# Patient Record
Sex: Female | Born: 1950 | Race: White | Hispanic: No | Smoking: Current every day smoker
Health system: Southern US, Community
[De-identification: ages and names within clinical notes are randomized; demographics above are authoritative.]

## PROBLEM LIST (undated history)

## (undated) DIAGNOSIS — G44009 Cluster headache syndrome, unspecified, not intractable: Secondary | ICD-10-CM

## (undated) DIAGNOSIS — F419 Anxiety disorder, unspecified: Secondary | ICD-10-CM

## (undated) DIAGNOSIS — R269 Unspecified abnormalities of gait and mobility: Secondary | ICD-10-CM

## (undated) DIAGNOSIS — R32 Unspecified urinary incontinence: Secondary | ICD-10-CM

## (undated) DIAGNOSIS — R413 Other amnesia: Secondary | ICD-10-CM

## (undated) DIAGNOSIS — E739 Lactose intolerance, unspecified: Secondary | ICD-10-CM

## (undated) DIAGNOSIS — F5105 Insomnia due to other mental disorder: Secondary | ICD-10-CM

## (undated) HISTORY — DX: Unspecified urinary incontinence: R32

## (undated) HISTORY — DX: Unspecified abnormalities of gait and mobility: R26.9

## (undated) HISTORY — PX: ABDOMINAL HYSTERECTOMY: SHX81

## (undated) HISTORY — DX: Other amnesia: R41.3

## (undated) HISTORY — DX: Cluster headache syndrome, unspecified, not intractable: G44.009

## (undated) HISTORY — PX: APPENDECTOMY: SHX54

## (undated) HISTORY — DX: Anxiety disorder, unspecified: F51.05

## (undated) HISTORY — DX: Anxiety disorder, unspecified: F41.9

---

## 1997-09-23 ENCOUNTER — Ambulatory Visit (HOSPITAL_COMMUNITY): Admission: RE | Admit: 1997-09-23 | Discharge: 1997-09-23 | Payer: Self-pay | Admitting: Family Medicine

## 1997-11-04 ENCOUNTER — Encounter: Payer: Self-pay | Admitting: Emergency Medicine

## 1997-11-04 ENCOUNTER — Emergency Department (HOSPITAL_COMMUNITY): Admission: EM | Admit: 1997-11-04 | Discharge: 1997-11-04 | Payer: Self-pay | Admitting: Emergency Medicine

## 1998-09-08 ENCOUNTER — Ambulatory Visit (HOSPITAL_COMMUNITY): Admission: RE | Admit: 1998-09-08 | Discharge: 1998-09-08 | Payer: Self-pay | Admitting: *Deleted

## 1998-12-13 ENCOUNTER — Ambulatory Visit (HOSPITAL_COMMUNITY): Admission: RE | Admit: 1998-12-13 | Discharge: 1998-12-13 | Payer: Self-pay | Admitting: *Deleted

## 1999-07-07 ENCOUNTER — Emergency Department (HOSPITAL_COMMUNITY): Admission: EM | Admit: 1999-07-07 | Discharge: 1999-07-07 | Payer: Self-pay | Admitting: Emergency Medicine

## 1999-07-07 ENCOUNTER — Encounter: Payer: Self-pay | Admitting: Emergency Medicine

## 1999-10-08 ENCOUNTER — Encounter: Payer: Self-pay | Admitting: Emergency Medicine

## 1999-10-08 ENCOUNTER — Emergency Department (HOSPITAL_COMMUNITY): Admission: EM | Admit: 1999-10-08 | Discharge: 1999-10-08 | Payer: Self-pay | Admitting: Emergency Medicine

## 2001-05-18 ENCOUNTER — Emergency Department (HOSPITAL_COMMUNITY): Admission: EM | Admit: 2001-05-18 | Discharge: 2001-05-18 | Payer: Self-pay | Admitting: Emergency Medicine

## 2004-03-11 ENCOUNTER — Ambulatory Visit: Payer: Self-pay | Admitting: *Deleted

## 2004-03-11 ENCOUNTER — Ambulatory Visit: Payer: Self-pay | Admitting: Family Medicine

## 2004-03-28 ENCOUNTER — Ambulatory Visit (HOSPITAL_COMMUNITY): Admission: RE | Admit: 2004-03-28 | Discharge: 2004-03-28 | Payer: Self-pay | Admitting: Internal Medicine

## 2004-03-29 ENCOUNTER — Encounter: Admission: RE | Admit: 2004-03-29 | Discharge: 2004-03-29 | Payer: Self-pay | Admitting: Internal Medicine

## 2004-04-19 ENCOUNTER — Ambulatory Visit: Payer: Self-pay | Admitting: Family Medicine

## 2004-12-01 ENCOUNTER — Ambulatory Visit: Payer: Self-pay | Admitting: Family Medicine

## 2004-12-09 ENCOUNTER — Ambulatory Visit (HOSPITAL_COMMUNITY): Admission: RE | Admit: 2004-12-09 | Discharge: 2004-12-09 | Payer: Self-pay | Admitting: Internal Medicine

## 2006-03-06 ENCOUNTER — Ambulatory Visit: Payer: Self-pay | Admitting: Nurse Practitioner

## 2006-03-09 ENCOUNTER — Ambulatory Visit (HOSPITAL_COMMUNITY): Admission: RE | Admit: 2006-03-09 | Discharge: 2006-03-09 | Payer: Self-pay | Admitting: Internal Medicine

## 2006-03-28 ENCOUNTER — Ambulatory Visit: Payer: Self-pay | Admitting: Family Medicine

## 2006-05-11 ENCOUNTER — Ambulatory Visit (HOSPITAL_COMMUNITY): Admission: RE | Admit: 2006-05-11 | Discharge: 2006-05-11 | Payer: Self-pay | Admitting: Family Medicine

## 2006-05-27 ENCOUNTER — Ambulatory Visit (HOSPITAL_COMMUNITY): Admission: RE | Admit: 2006-05-27 | Discharge: 2006-05-27 | Payer: Self-pay | Admitting: Internal Medicine

## 2006-06-28 ENCOUNTER — Ambulatory Visit: Payer: Self-pay | Admitting: Family Medicine

## 2006-08-10 ENCOUNTER — Emergency Department (HOSPITAL_COMMUNITY): Admission: EM | Admit: 2006-08-10 | Discharge: 2006-08-10 | Payer: Self-pay | Admitting: Emergency Medicine

## 2006-10-10 ENCOUNTER — Encounter (INDEPENDENT_AMBULATORY_CARE_PROVIDER_SITE_OTHER): Payer: Self-pay | Admitting: *Deleted

## 2006-12-04 ENCOUNTER — Emergency Department (HOSPITAL_COMMUNITY): Admission: EM | Admit: 2006-12-04 | Discharge: 2006-12-05 | Payer: Self-pay | Admitting: Emergency Medicine

## 2007-02-06 ENCOUNTER — Ambulatory Visit: Payer: Self-pay | Admitting: Internal Medicine

## 2007-02-07 ENCOUNTER — Ambulatory Visit (HOSPITAL_COMMUNITY): Admission: RE | Admit: 2007-02-07 | Discharge: 2007-02-07 | Payer: Self-pay | Admitting: Family Medicine

## 2007-02-19 ENCOUNTER — Emergency Department (HOSPITAL_COMMUNITY): Admission: EM | Admit: 2007-02-19 | Discharge: 2007-02-19 | Payer: Self-pay | Admitting: Emergency Medicine

## 2007-02-20 ENCOUNTER — Emergency Department (HOSPITAL_COMMUNITY): Admission: EM | Admit: 2007-02-20 | Discharge: 2007-02-20 | Payer: Self-pay | Admitting: Emergency Medicine

## 2007-04-26 ENCOUNTER — Encounter: Payer: Self-pay | Admitting: Internal Medicine

## 2007-04-26 ENCOUNTER — Ambulatory Visit: Payer: Self-pay | Admitting: Internal Medicine

## 2007-04-26 LAB — CONVERTED CEMR LAB
Chlamydia, DNA Probe: NEGATIVE
GC Probe Amp, Genital: NEGATIVE

## 2007-09-19 ENCOUNTER — Encounter: Payer: Self-pay | Admitting: Internal Medicine

## 2007-09-19 ENCOUNTER — Ambulatory Visit: Payer: Self-pay | Admitting: Internal Medicine

## 2009-02-23 ENCOUNTER — Telehealth (INDEPENDENT_AMBULATORY_CARE_PROVIDER_SITE_OTHER): Payer: Self-pay | Admitting: *Deleted

## 2010-02-13 ENCOUNTER — Encounter: Payer: Self-pay | Admitting: Internal Medicine

## 2010-02-13 ENCOUNTER — Encounter: Payer: Self-pay | Admitting: Family Medicine

## 2010-02-22 NOTE — Progress Notes (Signed)
Summary: triage/abd pain  Phone Note Call from Patient   Caller: Patient Reason for Call: Talk to Nurse Summary of Call: Patient states she has a history of intestinal adhesions in which surgery was required and that she was told she may come back..She is now having off and on pain in her lower adb that feels like labor pain..She states her last BM was yesterday and was normal..Her bowel pattern is every other day.Marland KitchenMarland KitchenShe denies vomiting and has a cold at this time..She smokes 3/4 PPD.Marland Kitchenand states her O2 sat was low..She isn't sure of the number either 64 or 94?  She is speaking in full sentences although her voice sounds scratchy.Marland KitchenMarland KitchenAppointment made in Amy's schedule Patient has to catch the bus to get here..Advised her to call 911 if difficulty breathing, vomiting or extreme pain. Initial call taken by: Conchita Paris,  February 23, 2009 5:14 PM

## 2010-10-13 LAB — BASIC METABOLIC PANEL
BUN: 11
Calcium: 9.3
Chloride: 102
Creatinine, Ser: 0.65
GFR calc Af Amer: >60
Potassium: 3.7
Sodium: 136

## 2010-10-13 LAB — DIFFERENTIAL
Lymphocytes Relative: 14
Lymphs Abs: 1.7
Monocytes Absolute: 0.9
Monocytes Relative: 7
Neutro Abs: 9.3 — ABNORMAL HIGH

## 2010-10-13 LAB — CBC
Hemoglobin: 13.1
MCHC: 34.6
RBC: 4.36
RDW: 12.9
WBC: 12.2 — ABNORMAL HIGH

## 2010-11-21 ENCOUNTER — Telehealth: Payer: Self-pay | Admitting: Family Medicine

## 2010-11-21 NOTE — Telephone Encounter (Signed)
Closed

## 2011-11-13 ENCOUNTER — Encounter (HOSPITAL_BASED_OUTPATIENT_CLINIC_OR_DEPARTMENT_OTHER): Payer: Self-pay | Admitting: *Deleted

## 2011-11-13 ENCOUNTER — Emergency Department (HOSPITAL_BASED_OUTPATIENT_CLINIC_OR_DEPARTMENT_OTHER): Payer: Self-pay

## 2011-11-13 ENCOUNTER — Emergency Department (HOSPITAL_BASED_OUTPATIENT_CLINIC_OR_DEPARTMENT_OTHER)
Admission: EM | Admit: 2011-11-13 | Discharge: 2011-11-13 | Disposition: A | Discharge status: Home / Self Care | Payer: Self-pay | Attending: Emergency Medicine | Admitting: Emergency Medicine

## 2011-11-13 DIAGNOSIS — J40 Bronchitis, not specified as acute or chronic: Secondary | ICD-10-CM | POA: Insufficient documentation

## 2011-11-13 DIAGNOSIS — G43909 Migraine, unspecified, not intractable, without status migrainosus: Secondary | ICD-10-CM | POA: Insufficient documentation

## 2011-11-13 DIAGNOSIS — R05 Cough: Secondary | ICD-10-CM | POA: Insufficient documentation

## 2011-11-13 DIAGNOSIS — F17200 Nicotine dependence, unspecified, uncomplicated: Secondary | ICD-10-CM | POA: Insufficient documentation

## 2011-11-13 DIAGNOSIS — R0602 Shortness of breath: Secondary | ICD-10-CM | POA: Insufficient documentation

## 2011-11-13 DIAGNOSIS — R110 Nausea: Secondary | ICD-10-CM | POA: Insufficient documentation

## 2011-11-13 DIAGNOSIS — R059 Cough, unspecified: Secondary | ICD-10-CM | POA: Insufficient documentation

## 2011-11-13 LAB — CBC
HCT: 36.4 % (ref 36.0–46.0)
MCH: 29 pg (ref 26.0–34.0)
MCHC: 32.4 g/dL (ref 30.0–36.0)
MCV: 89.4 fL (ref 78.0–100.0)
Platelets: 279 10*3/uL (ref 150–400)
RBC: 4.07 MIL/uL (ref 3.87–5.11)
RDW: 12.7 % (ref 11.5–15.5)
WBC: 8.6 10*3/uL (ref 4.0–10.5)

## 2011-11-13 LAB — COMPREHENSIVE METABOLIC PANEL
ALT: 7 U/L (ref 0–35)
AST: 13 U/L (ref 0–37)
BUN: 14 mg/dL (ref 6–23)
CO2: 27 mEq/L (ref 19–32)
Calcium: 9.7 mg/dL (ref 8.4–10.5)
Chloride: 104 mEq/L (ref 96–112)
Creatinine, Ser: 0.7 mg/dL (ref 0.50–1.10)
Potassium: 4.4 mEq/L (ref 3.5–5.1)
Sodium: 140 mEq/L (ref 135–145)

## 2011-11-13 MED ORDER — BUTALBITAL-APAP-CAFFEINE 50-325-40 MG PO TABS: 1.0000 | ORAL_TABLET | Freq: Four times a day (QID) | ORAL | Status: DC | PRN

## 2011-11-13 MED ORDER — AZITHROMYCIN 250 MG PO TABS: ORAL_TABLET | ORAL | Status: DC

## 2011-11-13 NOTE — ED Notes (Signed)
Pt c/o woke of with chest pain at 6 am radiates down right arm

## 2011-11-13 NOTE — ED Provider Notes (Signed)
History   This chart was scribed for Carleene Cooper III, MD by Sofie Rower. The patient was seen in room MH03/MH03 and the patient's care was started at 3:16PM.     CSN: [id number]  Arrival date & time 11/13/11  1352   First MD Initiated Contact with Patient 11/13/11 1516      Chief Complaint  Patient presents with  . Chest Pain    (Consider location/radiation/quality/duration/timing/severity/associated sxs/prior treatment) Patient is a 61 y.o. female presenting with chest pain. The history is provided by the patient. No language interpreter was used.  Chest Pain The chest pain began 6 - 12 hours ago. Chest pain occurs intermittently. The chest pain is worsening. The quality of the pain is described as pressure-like. The pain radiates to the right arm. Primary symptoms include shortness of breath, cough and nausea. Pertinent negatives for primary symptoms include no fever and no vomiting.  The shortness of breath began today. The shortness of breath developed suddenly. The shortness of breath is moderate.  The cough began today. The cough is productive. The sputum is white and green. It is exacerbated by smoking.  Nausea began today. She tried nothing for the symptoms. Risk factors include smoking/tobacco exposure.     Rachel Bryan is a 61 y.o. female , with a hx of migraine, who presents to the Emergency Department complaining of intermittnet, progressively worsening, chest pain, located at the left side of the chest, radiating towards the right upper extremity, onset today (6:00AM).  Associated symptoms include light headedness, shakiness, blurred vision, shortness of breath, productive white-green cough, and nausea. The pt reports she awoke this morning, experiencing a pressure like chest pain. The pt describes her chest pain as if someone is sitting on her chest. In addition, the pt informs she has been previously treated with nerve medication and migraine medication, however, due to the  recent closing of [facility], she has been unable to receive her medication. The pt has a hx of hysterectomy (performed 37 years ago) and allergies to demerol, penicillin, morphine, codeine, and sulfa antibiotics. In addition, the pt has a familial hx of heart disease (mother, CHF) and (brother).    The pt denies fever, earache, sore throat, vomiting, difficulty urinating, rash, fainting spells, and seizure or convulsion.    The pt is a current everyday smoker (1.0 packs/day), however, she does not drink alcohol.   PCP was [facility], however, the pt does not have a PCP at present.    History reviewed. No pertinent past medical history.  Past Surgical History  Procedure Date  . Appendectomy   . Abdominal hysterectomy     History reviewed. No pertinent family history.  History  Substance Use Topics  . Smoking status: Current Every Day Smoker -- 1.0 packs/day  . Smokeless tobacco: Not on file  . Alcohol Use: No    OB History    Grav Para Term Preterm Abortions TAB SAB Ect Mult Living                  Review of Systems  Constitutional: Negative for fever.  Respiratory: Positive for cough and shortness of breath.   Cardiovascular: Positive for chest pain.  Gastrointestinal: Positive for nausea. Negative for vomiting.  All other systems reviewed and are negative.    Allergies  Codeine; Demerol; Morphine and related; Penicillins; and Sulfa antibiotics  Home Medications  No current outpatient prescriptions on file.  BP 123/72  Pulse 88  Temp 98.1 F (36.7 C) (Oral)  Resp 18  Ht 5\' 7"  (1.702 m)  Wt 135 lb (61.236 kg)  BMI 21.14 kg/m2  SpO2 100%  Physical Exam  Nursing note and vitals reviewed. Constitutional: She is oriented to person, place, and time. She appears well-developed and well-nourished.  HENT:  Head: Atraumatic.  Right Ear: Tympanic membrane normal.  Left Ear: Tympanic membrane normal.  Nose: Nose normal.  Mouth/Throat: Oropharynx is clear and  moist.  Eyes: Conjunctivae normal and EOM are normal. Pupils are equal, round, and reactive to light.  Neck: Normal range of motion. Neck supple.  Cardiovascular: Normal rate, regular rhythm and normal heart sounds.   Pulmonary/Chest: Effort normal. She has rales (Both posterior lung fields. ).  Abdominal: Soft. Bowel sounds are normal.  Musculoskeletal: Normal range of motion.  Lymphadenopathy:    She has no cervical adenopathy.  Neurological: She is alert and oriented to person, place, and time.       Neurologically intact.   Skin: Skin is warm and dry.  Psychiatric: She has a normal mood and affect. Her behavior is normal.    ED Course  Procedures (including critical care time)  DIAGNOSTIC STUDIES: Oxygen Saturation is 100% on [place], normal by my interpretation.    COORDINATION OF CARE:   3:40 PM- Treatment plan concerning evaluation of laboratory results including blood sugar, troponin, and CBC. In addition, chest x-ray results discussed. Application of azithromycin antibiotics discussed with patient. Pt agrees with treatment.      Results for orders placed during the hospital encounter of 11/13/11  CBC      Component Value Range   WBC 8.6  4.0 - 10.5 K/uL   RBC 4.07  3.87 - 5.11 MIL/uL   Hemoglobin 11.8 (*) 12.0 - 15.0 g/dL   HCT 45.4  09.8 - 11.9 %   MCV 89.4  78.0 - 100.0 fL   MCH 29.0  26.0 - 34.0 pg   MCHC 32.4  30.0 - 36.0 g/dL   RDW 14.7  82.9 - 56.2 %   Platelets 279  150 - 400 K/uL  COMPREHENSIVE METABOLIC PANEL      Component Value Range   Sodium 140  135 - 145 mEq/L   Potassium 4.4  3.5 - 5.1 mEq/L   Chloride 104  96 - 112 mEq/L   CO2 27  19 - 32 mEq/L   Glucose, Bld 96  70 - 99 mg/dL   BUN 14  6 - 23 mg/dL   Creatinine, Ser 1.30  0.50 - 1.10 mg/dL   Calcium 9.7  8.4 - 86.5 mg/dL   Total Protein 7.0  6.0 - 8.3 g/dL   Albumin 3.6  3.5 - 5.2 g/dL   AST 13  0 - 37 U/L   ALT 7  0 - 35 U/L   Alkaline Phosphatase 63  39 - 117 U/L   Total Bilirubin 0.2 (*)  0.3 - 1.2 mg/dL   GFR calc non Af Amer >90  >90 mL/min   GFR calc Af Amer >90  >90 mL/min  TROPONIN I      Component Value Range   Troponin I <0.30  <0.30 ng/mL   Dg Chest 2 View  11/13/2011  *RADIOLOGY REPORT*  Clinical Data: Chest pain  CHEST - 2 VIEW  Comparison: February 20, 2007  Findings: There is no focal infiltrate, pulmonary edema, or pleural effusion.  Stable biapical pleural thickening are noted.  The mediastinal contour and cardiac silhouette are normal.  Soft tissues and osseous structures are stable.  IMPRESSION: No acute cardiopulmonary disease identified.   Original Report Authenticated By: Sherian Rein, M.D.    3:18 PM.  Date: 11/13/2011  Rate: 84  Rhythm: normal sinus rhythm  QRS Axis: normal  Intervals: normal  ST/T Wave abnormalities: normal  Conduction Disutrbances:none  Narrative Interpretation: Normal EKG  Old EKG Reviewed: unchanged      1. Bronchitis   2. Migraine headache     I personally performed the services described in this documentation, which was scribed in my presence. The recorded information has been reviewed and considered.  Osvaldo Human, MD    Carleene Cooper III, MD 11/13/11 [phone]

## 2011-11-13 NOTE — ED Notes (Signed)
MD at bedside.Dr Davidson 

## 2012-01-12 ENCOUNTER — Ambulatory Visit (INDEPENDENT_AMBULATORY_CARE_PROVIDER_SITE_OTHER): Payer: Self-pay | Admitting: Family Medicine

## 2012-01-12 ENCOUNTER — Encounter: Payer: Self-pay | Admitting: Family Medicine

## 2012-01-12 VITALS — BP 133/73 | HR 94 | Temp 97.8°F | Ht 66.0 in | Wt 145.0 lb

## 2012-01-12 DIAGNOSIS — F4321 Adjustment disorder with depressed mood: Secondary | ICD-10-CM | POA: Insufficient documentation

## 2012-01-12 DIAGNOSIS — D2360 Other benign neoplasm of skin of unspecified upper limb, including shoulder: Secondary | ICD-10-CM

## 2012-01-12 DIAGNOSIS — G4700 Insomnia, unspecified: Secondary | ICD-10-CM | POA: Insufficient documentation

## 2012-01-12 DIAGNOSIS — F419 Anxiety disorder, unspecified: Secondary | ICD-10-CM

## 2012-01-12 DIAGNOSIS — R51 Headache: Secondary | ICD-10-CM

## 2012-01-12 DIAGNOSIS — D367 Benign neoplasm of other specified sites: Secondary | ICD-10-CM

## 2012-01-12 DIAGNOSIS — F411 Generalized anxiety disorder: Secondary | ICD-10-CM

## 2012-01-12 MED ORDER — HYDROXYZINE HCL 10 MG PO TABS: 10.0000 mg | ORAL_TABLET | Freq: Every evening | ORAL | Status: DC | PRN

## 2012-01-12 NOTE — Patient Instructions (Addendum)
Thank you for coming in today Please start taking the hydroxyzine for sleep as needed. If this is too expensive then try Benadryl. Please come back as needed to discuss your other medical problems Please start using either Mucinex D or robitussin for your cough. Please start taking Ibuprofen 600mg  every 6 hours for your arm pain.  If the cyst continues to grow you may want it removed sometime in January You are doing great. You will be a better person for going through your current challenges

## 2012-01-15 ENCOUNTER — Encounter: Payer: Self-pay | Admitting: Family Medicine

## 2012-01-15 DIAGNOSIS — D367 Benign neoplasm of other specified sites: Secondary | ICD-10-CM | POA: Insufficient documentation

## 2012-01-15 DIAGNOSIS — R519 Headache, unspecified: Secondary | ICD-10-CM | POA: Insufficient documentation

## 2012-01-15 NOTE — Assessment & Plan Note (Signed)
Steroid lidocaine injection Possible excision in the future

## 2012-01-15 NOTE — Assessment & Plan Note (Signed)
No fioricet at this time Tylenol and NSAID prn

## 2012-01-15 NOTE — Assessment & Plan Note (Signed)
Hydroxyzine prn 

## 2012-01-15 NOTE — Progress Notes (Signed)
Rachel Bryan is a 61 y.o. female who presents to [facility] today for New Patient. Sleep issues, and Cyst in Arm  Sleep: difficulty sleeping since son went to prison abt 41mo ago. Kids have been in foster home for 3 mo. Watches TV around 8pm and falls asleep for 30mi, then has difficulty faling asleep. Takes Tylenol PM w/o relief. Tired during the day. No caffinated beverages.   Cyst in arm: present for 1 year and growing slowly. 4 days ago became painful. Denies fever, rash, discharge, skin changes.   Headaches: Has had off and on for years. Dx w/ Cluster headaches. Resolve w/ fioricet. Last took approximately 1 yr ago.  Treated for Bronchitis back in 11/13/11. Finished Azithro. Denies any lasting cough, fever, SOB.  The following portions of the patient's history were reviewed and updated as appropriate: allergies, current medications, past medical history, family and social history, and problem list.  Patient is a smoker    No past medical history on file.  ROS as above otherwise neg.    Medications reviewed. Current Outpatient Prescriptions  Medication Sig Dispense Refill  . azithromycin (ZITHROMAX Z-PAK) 250 MG tablet Take 2 tablets today, and then 1 tablet once a day for the next 4 days.  6 tablet  0  . butalbital-acetaminophen-caffeine (FIORICET) 50-325-40 MG per tablet Take 1-2 tablets by mouth every 6 (six) hours as needed for headache.  20 tablet  0  . hydrOXYzine (ATARAX/VISTARIL) 10 MG tablet Take 1 tablet (10 mg total) by mouth at bedtime as needed for anxiety.  30 tablet  0    Exam: BP 133/73  Pulse 94  Temp 97.8 F (36.6 C) (Oral)  Ht 5\' 6"  (1.676 m)  Wt 145 lb (65.772 kg)  BMI 23.40 kg/m2 Gen: Well NAD HEENT: EOMI,  MMM Lungs: CTABL Nl WOB Heart: RRR no MRG Musc: 1x2cm mobile round cystlike structure of the L forearm in the biceps region. Abd: NABS, NT, ND Exts: Non edematous BL  LE, warm and well perfused.   No results found for this or any previous visit (from the  past 72 hour(s)).

## 2012-01-15 NOTE — Assessment & Plan Note (Signed)
Likely secondary to anxiety SLeep journal Sleep hygeine discussed Hydroxyzine prn

## 2012-01-18 ENCOUNTER — Telehealth: Payer: Self-pay | Admitting: Family Medicine

## 2012-01-18 NOTE — Telephone Encounter (Signed)
EMERGENCY LINE CALL:  Patient states she has severe abdominal pain, tender to the touch in the epigastric area. She states she felt fine when she woke up this morning. She is having regular bowel movements. Patient has a history of peritoneal infection and this feels the same. Since I am unable to evaluate her over the phone and she sounded like she was in pain, I have recommended she be evaluated in the ED tonight. Patient agrees with this plan.   Jonell Brumbaugh M. Gwenetta Devos, M.D. 01/18/2012 7:38 PM

## 2012-01-29 NOTE — Progress Notes (Signed)
  Cyst injection   Procedure: Steroid injection of inflamed sabaceous cyst Verbal consent given after discussion of risks and benefits Medication: 40mg  Solumedrol  2cc Lidocaine without epi Preparation: area cleansed with alcohol and betadine Time Out taken  Injection  Landmarks identified 3 cc of medication injected into the cyst space using  Patient tolerated well without bleeding or paresthesias. Patient expressed immediate relief from injection    Shelly Flatten, MD Family Medicine PGY-2 01/29/2012, 8:35 AM

## 2012-02-05 ENCOUNTER — Ambulatory Visit (INDEPENDENT_AMBULATORY_CARE_PROVIDER_SITE_OTHER): Payer: Self-pay | Admitting: Family Medicine

## 2012-02-05 VITALS — BP 121/77 | HR 88 | Temp 98.0°F | Ht 66.0 in | Wt 139.0 lb

## 2012-02-05 DIAGNOSIS — M25519 Pain in unspecified shoulder: Secondary | ICD-10-CM

## 2012-02-05 DIAGNOSIS — D367 Benign neoplasm of other specified sites: Secondary | ICD-10-CM

## 2012-02-05 DIAGNOSIS — F419 Anxiety disorder, unspecified: Secondary | ICD-10-CM | POA: Insufficient documentation

## 2012-02-05 DIAGNOSIS — F329 Major depressive disorder, single episode, unspecified: Secondary | ICD-10-CM

## 2012-02-05 DIAGNOSIS — F411 Generalized anxiety disorder: Secondary | ICD-10-CM

## 2012-02-05 DIAGNOSIS — G4700 Insomnia, unspecified: Secondary | ICD-10-CM

## 2012-02-05 DIAGNOSIS — D2360 Other benign neoplasm of skin of unspecified upper limb, including shoulder: Secondary | ICD-10-CM

## 2012-02-05 DIAGNOSIS — F32A Depression, unspecified: Secondary | ICD-10-CM | POA: Insufficient documentation

## 2012-02-05 MED ORDER — CITALOPRAM HYDROBROMIDE 20 MG PO TABS: 20.0000 mg | ORAL_TABLET | Freq: Every day | ORAL | Status: DC

## 2012-02-05 NOTE — Assessment & Plan Note (Addendum)
Starting Citalopram 20mg  Numerous life stressors including son in prison PHQ9 w/ score of 13  No SI/HI F/u in 1 wk

## 2012-02-05 NOTE — Patient Instructions (Addendum)
Thank you for coming in today You have a lot of stress in your life I would like for yuou to start citalopram for depression Please come back to see me in 1 week Please start taking 400-600mg  if ibuprofen for your shoulder pain I will try to get your records from [place] Have a great day. Shoulder Exercises EXERCISES  RANGE OF MOTION (ROM) AND STRETCHING EXERCISES These exercises may help you when beginning to rehabilitate your injury. Your symptoms may resolve with or without further involvement from your physician, physical therapist or athletic trainer. While completing these exercises, remember:   Restoring tissue flexibility helps normal motion to return to the joints. This allows healthier, less painful movement and activity.  An effective stretch should be held for at least 30 seconds.  A stretch should never be painful. You should only feel a gentle lengthening or release in the stretched tissue. ROM - Pendulum  Bend at the waist so that your right / left arm falls away from your body. Support yourself with your opposite hand on a solid surface, such as a table or a countertop.  Your right / left arm should be perpendicular to the ground. If it is not perpendicular, you need to lean over farther. Relax the muscles in your right / left arm and shoulder as much as possible.  Gently sway your hips and trunk so they move your right / left arm without any use of your right / left shoulder muscles.  Progress your movements so that your right / left arm moves side to side, then forward and backward, and finally, both clockwise and counterclockwise.  Complete __________ repetitions in each direction. Many people use this exercise to relieve discomfort in their shoulder as well as to gain range of motion. Repeat __________ times. Complete this exercise __________ times per day. STRETCH  Flexion, Standing  Stand with good posture. With an underhand grip on your right / left hand and  an overhand grip on the opposite hand, grasp a broomstick or cane so that your hands are a little more than shoulder-width apart.  Keeping your right / left elbow straight and shoulder muscles relaxed, push the stick with your opposite hand to raise your right / left arm in front of your body and then overhead. Raise your arm until you feel a stretch in your right / left shoulder, but before you have increased shoulder pain.  Try to avoid shrugging your right / left shoulder as your arm rises by keeping your shoulder blade tucked down and toward your mid-back spine. Hold __________ seconds.  Slowly return to the starting position. Repeat __________ times. Complete this exercise __________ times per day. STRETCH - Internal Rotation  Place your right / left hand behind your back, palm-up.  Throw a towel or belt over your opposite shoulder. Grasp the towel/belt with your right / left hand.  While keeping an upright posture, gently pull up on the towel/belt until you feel a stretch in the front of your right / left shoulder.  Avoid shrugging your right / left shoulder as your arm rises by keeping your shoulder blade tucked down and toward your mid-back spine.  Hold __________. Release the stretch by lowering your opposite hand. Repeat __________ times. Complete this exercise __________ times per day. STRETCH - External Rotation and Abduction  Stagger your stance through a doorframe. It does not matter which foot is forward.  As instructed by your physician, physical therapist or athletic trainer, place your  hands:  And forearms above your head and on the door frame.  And forearms at head-height and on the door frame.  At elbow-height and on the door frame.  Keeping your head and chest upright and your stomach muscles tight to prevent over-extending your low-back, slowly shift your weight onto your front foot until you feel a stretch across your chest and/or in the front of your  shoulders.  Hold __________ seconds. Shift your weight to your back foot to release the stretch. Repeat __________ times. Complete this stretch __________ times per day.  STRENGTHENING EXERCISES  These exercises may help you when beginning to rehabilitate your injury. They may resolve your symptoms with or without further involvement from your physician, physical therapist or athletic trainer. While completing these exercises, remember:   Muscles can gain both the endurance and the strength needed for everyday activities through controlled exercises.  Complete these exercises as instructed by your physician, physical therapist or athletic trainer. Progress the resistance and repetitions only as guided.  You may experience muscle soreness or fatigue, but the pain or discomfort you are trying to eliminate should never worsen during these exercises. If this pain does worsen, stop and make certain you are following the directions exactly. If the pain is still present after adjustments, discontinue the exercise until you can discuss the trouble with your clinician.  If advised by your physician, during your recovery, avoid activity or exercises which involve actions that place your right / left hand or elbow above your head or behind your back or head. These positions stress the tissues which are trying to heal. STRENGTH - Scapular Depression and Adduction  With good posture, sit on a firm chair. Supported your arms in front of you with pillows, arm rests or a table top. Have your elbows in line with the sides of your body.  Gently draw your shoulder blades down and toward your mid-back spine. Gradually increase the tension without tensing the muscles along the top of your shoulders and the back of your neck.  Hold for __________ seconds. Slowly release the tension and relax your muscles completely before completing the next repetition.  After you have practiced this exercise, remove the arm support  and complete it in standing as well as sitting. Repeat __________ times. Complete this exercise __________ times per day.  STRENGTH - External Rotators  Secure a rubber exercise band/tubing to a fixed object so that it is at the same height as your right / left elbow when you are standing or sitting on a firm surface.  Stand or sit so that the secured exercise band/tubing is at your side that is not injured.  Bend your elbow 90 degrees. Place a folded towel or small pillow under your right / left arm so that your elbow is a few inches away from your side.  Keeping the tension on the exercise band/tubing, pull it away from your body, as if pivoting on your elbow. Be sure to keep your body steady so that the movement is only coming from your shoulder rotating.  Hold __________ seconds. Release the tension in a controlled manner as you return to the starting position. Repeat __________ times. Complete this exercise __________ times per day.  STRENGTH - Supraspinatus  Stand or sit with good posture. Grasp a __________ weight or an exercise band/tubing so that your hand is "thumbs-up," like when you shake hands.  Slowly lift your right / left hand from your thigh into the air, traveling about  30 degrees from straight out at your side. Lift your hand to shoulder height or as far as you can without increasing any shoulder pain. Initially, many people do not lift their hands above shoulder height.  Avoid shrugging your right / left shoulder as your arm rises by keeping your shoulder blade tucked down and toward your mid-back spine.  Hold for __________ seconds. Control the descent of your hand as you slowly return to your starting position. Repeat __________ times. Complete this exercise __________ times per day.  STRENGTH - Shoulder Extensors  Secure a rubber exercise band/tubing so that it is at the height of your shoulders when you are either standing or sitting on a firm arm-less chair.  With  a thumbs-up grip, grasp an end of the band/tubing in each hand. Straighten your elbows and lift your hands straight in front of you at shoulder height. Step back away from the secured end of band/tubing until it becomes tense.  Squeezing your shoulder blades together, pull your hands down to the sides of your thighs. Do not allow your hands to go behind you.  Hold for __________ seconds. Slowly ease the tension on the band/tubing as you reverse the directions and return to the starting position. Repeat __________ times. Complete this exercise __________ times per day.  STRENGTH - Scapular Retractors  Secure a rubber exercise band/tubing so that it is at the height of your shoulders when you are either standing or sitting on a firm arm-less chair.  With a palm-down grip, grasp an end of the band/tubing in each hand. Straighten your elbows and lift your hands straight in front of you at shoulder height. Step back away from the secured end of band/tubing until it becomes tense.  Squeezing your shoulder blades together, draw your elbows back as you bend them. Keep your upper arm lifted away from your body throughout the exercise.  Hold __________ seconds. Slowly ease the tension on the band/tubing as you reverse the directions and return to the starting position. Repeat __________ times. Complete this exercise __________ times per day. STRENGTH  Scapular Depressors  Find a sturdy chair without wheels, such as a from a dining room table.  Keeping your feet on the floor, lift your bottom from the seat and lock your elbows.  Keeping your elbows straight, allow gravity to pull your body weight down. Your shoulders will rise toward your ears.  Raise your body against gravity by drawing your shoulder blades down your back, shortening the distance between your shoulders and ears. Although your feet should always maintain contact with the floor, your feet should progressively support less body weight as  you get stronger.  Hold __________ seconds. In a controlled and slow manner, lower your body weight to begin the next repetition. Repeat __________ times. Complete this exercise __________ times per day.  Document Released: 11/23/2004 Document Revised: 04/03/2011 Document Reviewed: 04/23/2008 [facility] Patient Information 2013 [place], [place].  Shoulder Pain The shoulder is a ball and socket joint. Many muscles and tendons hold the joint together. Many types of injuries and medical problems can cause pain in one or more parts of the shoulder. HOME CARE  If your doctor feels the problem is not serious, it may help to do the following:  Put ice on the area.  Put ice in a plastic bag.  Place a towel between your skin and the bag.  Leave the ice on for 15 to 20 minutes, 3 to 4 times a day.  Do this  for the first 2 day or as told by your doctor.  Stop using cold packs if they do not help with the pain.  Do not take your sling off (except to shower or bathe) until you see your doctor. When taking off the sling, move the arm as little as possible.  Take medicine as told by your doctor.  Keep all follow-up appointments. GET HELP RIGHT AWAY IF:   The arm, hand, or fingers are numb or tingling.  The arm, hand, or fingers are puffy (swollen), painful, or turn white or blue.  You have trouble moving your hand and fingers on the injured side.  You have chest pain or shortness of breath.  New pain happens in the arm, hand, or fingers.  The hand or fingers on the injured side become cold.  The medicine is not helping the pain go away. MAKE SURE YOU:   Understand these instructions.  Will watch your condition.  Will get help right away if you are not doing well or get worse. Document Released: 06/28/2007 Document Revised: 04/03/2011 Document Reviewed: 06/28/2007 [facility] Patient Information 2013 [place], [place].

## 2012-02-05 NOTE — Progress Notes (Signed)
Rachel Bryan is a 62 y.o. female who presents to [facility] today for hospital follow up  SBO: admitted to [facility] for SBO. Discharged on 01/23/12 w/ cipro and flagyl that are now completed. Denies n/v/d/c, fever.  Insomnia: Mild improvement after taking out evening nap. Still w/ multiple nightime awakenings. Feels very anxious. Several family stressors. Tried benadryl w/o success.  Depression: feelings of depression. Nevern previously dx. No previous medications. No h/o manic type episodes. PHQ9 today 13 w/ marked difficulty in sleep and interest in activities. Denies HI, SI. Symptoms present for approximately 74yr  Arm pain: resolved after injection  Shoulder pain: Present for several weeks. Worse w/ certain movements. Originates in shoulder and radiates down arm. Denies trauma. Has not taken anything to improve symptoms.    The following portions of the patient's history were reviewed and updated as appropriate: allergies, current medications, past medical history, family and social history, and problem list.  Patient is a smoker  Past Medical History  Diagnosis Date  . Cluster headache   . Anxiety   . Insomnia secondary to anxiety   . Incontinence of urine     ROS as above otherwise neg.    Medications reviewed. Current Outpatient Prescriptions  Medication Sig Dispense Refill  . butalbital-acetaminophen-caffeine (FIORICET) 50-325-40 MG per tablet Take 1-2 tablets by mouth every 6 (six) hours as needed for headache.  20 tablet  0  . hydrOXYzine (ATARAX/VISTARIL) 10 MG tablet Take 1 tablet (10 mg total) by mouth at bedtime as needed for anxiety.  30 tablet  0    Exam: BP 121/77  Pulse 88  Temp 98 F (36.7 C)  Ht 5\' 6"  (1.676 m)  Wt 139 lb (63.05 kg)  BMI 22.44 kg/m2 Gen: Well NAD HEENT: EOMI,  MMM Lungs: CTABL Nl WOB Heart: RRR no MRG Abd: NABS, NT, ND Musc: Hawkings minimally pos on L, Empty can positive on L. ROM diminished on L.  Psych: saddened affect. Speech  appropriate.   No results found for this or any previous visit (from the past 72 hour(s)).

## 2012-02-12 ENCOUNTER — Encounter: Payer: Self-pay | Admitting: Family Medicine

## 2012-02-12 ENCOUNTER — Ambulatory Visit (INDEPENDENT_AMBULATORY_CARE_PROVIDER_SITE_OTHER): Payer: Self-pay | Admitting: Family Medicine

## 2012-02-12 VITALS — BP 125/71 | HR 99 | Temp 98.4°F | Ht 66.0 in | Wt 142.0 lb

## 2012-02-12 DIAGNOSIS — M25519 Pain in unspecified shoulder: Secondary | ICD-10-CM | POA: Insufficient documentation

## 2012-02-12 DIAGNOSIS — F329 Major depressive disorder, single episode, unspecified: Secondary | ICD-10-CM

## 2012-02-12 DIAGNOSIS — F411 Generalized anxiety disorder: Secondary | ICD-10-CM

## 2012-02-12 DIAGNOSIS — G4700 Insomnia, unspecified: Secondary | ICD-10-CM

## 2012-02-12 DIAGNOSIS — F419 Anxiety disorder, unspecified: Secondary | ICD-10-CM

## 2012-02-12 NOTE — Patient Instructions (Addendum)
Thank you for coming in today The citalopram is working. Keep taking it You are doing great We will get the records from [facility] Please come back to see me 2-4 weeks We can talk more about your headaches at that time. Please call 24/7 with any concerns.   Citalopram tablets What is this medicine? CITALOPRAM (sye TAL oh pram) is a medicine for depression. This medicine may be used for other purposes; ask your health care provider or pharmacist if you have questions. What should I tell my health care provider before I take this medicine? They need to know if you have any of these conditions: -bipolar disorder or a family history of bipolar disorder -diabetes -heart disease -history of irregular heartbeat -kidney or liver disease -low levels of magnesium or potassium in the blood -receiving electroconvulsive therapy -seizures (convulsions) -suicidal thoughts or a previous suicide attempt -an unusual or allergic reaction to citalopram, escitalopram, other medicines, foods, dyes, or preservatives -pregnant or trying to become pregnant -breast-feeding How should I use this medicine? Take this medicine by mouth with a glass of water. Follow the directions on the prescription label. You can take it with or without food. Take your medicine at regular intervals. Do not take your medicine more often than directed. Do not stop taking this medicine suddenly except upon the advice of your doctor. Stopping this medicine too quickly may cause serious side effects or your condition may worsen. A special MedGuide will be given to you by the pharmacist with each prescription and refill. Be sure to read this information carefully each time. Talk to your pediatrician regarding the use of this medicine in children. Special care may be needed. Patients over 78 years old may have a stronger reaction and need a smaller dose. Overdosage: If you think you have taken too much of this medicine contact  a poison control center or emergency room at once. NOTE: This medicine is only for you. Do not share this medicine with others. What if I miss a dose? If you miss a dose, take it as soon as you can. If it is almost time for your next dose, take only that dose. Do not take double or extra doses. What may interact with this medicine? Do not take this medicine with any of the following medications: -cisapride -dofetlide -dronedarone -escitalopram -linezolid -MAOIs like Carbex, Eldepryl, Marplan, Nardil, and Parnate -methylene blue (injected into a vein) -pimozide -posaconazole -thioridazine -ziprasidone This medicine may also interact with the following medications: -alcohol -aspirin and aspirin-like medicines -carbamazepine -certain medicines for depression, anxiety, or psychotic disturbances -certain medicines for fungal infections like ketoconazole and itraconazole -certain medicines used to treat infections like chloroquine, clarithromycin, erythromycin, pentamidine -certain medicines for migraine headaches like almotriptan, eletriptan, frovatriptan, naratriptan, rizatriptan, sumatriptan, zolmitriptan -cimetidine -diuretics -fentanyl -furazolidone -isoniazid -lithium -medicines for sleep -medicines that treat or prevent blood clots like warfarin, enoxaparin, and dalteparin -methadone -metoprolol -NSAIDs, medicines for pain and inflammation, like ibuprofen or naproxen -omeprazole -other medicines that prolong the QT interval (cause an abnormal heart rhythm) -procarbazine -rasagiline -supplements like St. John's wort, kava kava, valerian -tramadol -tryptophan This list may not describe all possible interactions. Give your health care provider a list of all the medicines, herbs, non-prescription drugs, or dietary supplements you use. Also tell them if you smoke, drink alcohol, or use illegal drugs. Some items may interact with your medicine. What should I watch for while  using this medicine? Tell your doctor if your symptoms do not get better  or if they get worse. Visit your doctor or health care professional for regular checks on your progress. Because it may take several weeks to see the full effects of this medicine, it is important to continue your treatment as prescribed by your doctor. Patients and their families should watch out for new or worsening thoughts of suicide or depression. Also watch out for sudden changes in feelings such as feeling anxious, agitated, panicky, irritable, hostile, aggressive, impulsive, severely restless, overly excited and hyperactive, or not being able to sleep. If this happens, especially at the beginning of treatment or after a change in dose, call your health care professional. Bonita Quin may get drowsy or dizzy. Do not drive, use machinery, or do anything that needs mental alertness until you know how this medicine affects you. Do not stand or sit up quickly, especially if you are an older patient. This reduces the risk of dizzy or fainting spells. Alcohol may interfere with the effect of this medicine. Avoid alcoholic drinks. Your mouth may get dry. Chewing sugarless gum or sucking hard candy, and drinking plenty of water will help. Contact your doctor if the problem does not go away or is severe. What side effects may I notice from receiving this medicine? Side effects that you should report to your doctor or health care professional as soon as possible: -allergic reactions like skin rash, itching or hives, swelling of the face, lips, or tongue -chest pain -confusion -dizziness -fast, irregular heartbeat -fast talking and excited feelings or actions that are out of control -feeling faint or lightheaded, falls -hallucination, loss of contact with reality -seizures -shortness of breath -suicidal thoughts or other mood changes -unusual bleeding or bruising Side effects that usually do not require medical attention (report to your  doctor or health care professional if they continue or are bothersome): -blurred vision -change in appetite -change in sex drive or performance -headache -increased sweating -nausea -trouble sleeping This list may not describe all possible side effects. Call your doctor for medical advice about side effects. You may report side effects to FDA at 1-800-FDA-1088. Where should I keep my medicine? Keep out of reach of children. Store at room temperature between 15 and 30 degrees C (59 and 86 degrees F). Throw away any unused medicine after the expiration date. NOTE: This sheet is a summary. It may not cover all possible information. If you have questions about this medicine, talk to your doctor, pharmacist, or health care provider.  2013, [employer]. (05/26/2011 7:10:58 PM)

## 2012-02-12 NOTE — Assessment & Plan Note (Signed)
Improved

## 2012-02-12 NOTE — Progress Notes (Signed)
Rachel Bryan is a 62 y.o. female who presents to [facility] today for depression  Depression: Feels like citalopram. Feeling happier and more positive. Taking Citalopram as Rx. Denies andy SI/HI.   Anxiousness: better on citalopram  Insomnia: no real improvement at this time. Still not napping in the late evening   Shoulder pain: pain w/ exercises. Improving ROM and pain overall. Taking Ibuprofen w/ relief.   The following portions of the patient's history were reviewed and updated as appropriate: allergies, current medications, past medical history, family and social history, and problem list.  Patient is a smoker  Past Medical History  Diagnosis Date  . Cluster headache   . Anxiety   . Insomnia secondary to anxiety   . Incontinence of urine     ROS as above otherwise neg.    Medications reviewed. Current Outpatient Prescriptions  Medication Sig Dispense Refill  . butalbital-acetaminophen-caffeine (FIORICET) 50-325-40 MG per tablet Take 1-2 tablets by mouth every 6 (six) hours as needed for headache.  20 tablet  0  . citalopram (CELEXA) 20 MG tablet Take 1 tablet (20 mg total) by mouth daily.  30 tablet  3  . hydrOXYzine (ATARAX/VISTARIL) 10 MG tablet Take 1 tablet (10 mg total) by mouth at bedtime as needed for anxiety.  30 tablet  0    Exam: BP 125/71  Pulse 99  Temp 98.4 F (36.9 C) (Oral)  Ht 5\' 6"  (1.676 m)  Wt 142 lb (64.411 kg)  BMI 22.92 kg/m2 Gen: Well NAD HEENT: EOMI,  MMM Lungs:  WOB  No results found for this or any previous visit (from the past 72 hour(s)).

## 2012-02-12 NOTE — Assessment & Plan Note (Signed)
Mild improvement after making changes recommended at last appt Anxiousness and depression likely contributing significantly Citalopram Starting now and f/u in 1 wk

## 2012-02-12 NOTE — Assessment & Plan Note (Addendum)
Likely some rotator cuff impingement w/ supraspinatus inflammation Home exercise regimen provided NSAIDs for relief No injection at this time

## 2012-02-12 NOTE — Assessment & Plan Note (Signed)
Improved.  Continue NSAIDs and exercises

## 2012-02-12 NOTE — Assessment & Plan Note (Signed)
Not much improvement. Continue diary, sleep hygiene, citalopram as anxiousness/depression likely contributing

## 2012-02-12 NOTE — Assessment & Plan Note (Signed)
No pain currently after injection Stable. No intervention at this time

## 2012-02-12 NOTE — Assessment & Plan Note (Signed)
Much improved today Some placebo vs true benefit from Citalopram. Continue citalopram at current dose No HI/SI F/u in 2-4 wks pending pt preference.

## 2012-02-12 NOTE — Assessment & Plan Note (Signed)
Starting citalopram today F/u in 1 wk

## 2012-02-26 ENCOUNTER — Ambulatory Visit: Payer: Self-pay | Admitting: Family Medicine

## 2012-03-09 ENCOUNTER — Other Ambulatory Visit: Payer: Self-pay

## 2012-03-11 ENCOUNTER — Telehealth: Payer: Self-pay | Admitting: Family Medicine

## 2012-03-11 NOTE — Telephone Encounter (Signed)
Pt is asking for something for pain for her shoulder - she has appt for next week and needs something in the meantime   Or call [phone]

## 2012-03-12 NOTE — Telephone Encounter (Signed)
Pt is calling again and needs to know something asap

## 2012-03-13 ENCOUNTER — Ambulatory Visit (INDEPENDENT_AMBULATORY_CARE_PROVIDER_SITE_OTHER): Payer: Self-pay | Admitting: Family Medicine

## 2012-03-13 ENCOUNTER — Encounter: Payer: Self-pay | Admitting: Family Medicine

## 2012-03-13 VITALS — BP 136/84 | HR 88 | Ht 66.0 in | Wt 146.0 lb

## 2012-03-13 DIAGNOSIS — M79622 Pain in left upper arm: Secondary | ICD-10-CM

## 2012-03-13 DIAGNOSIS — M79609 Pain in unspecified limb: Secondary | ICD-10-CM

## 2012-03-13 DIAGNOSIS — M79602 Pain in left arm: Secondary | ICD-10-CM

## 2012-03-13 MED ORDER — GABAPENTIN 300 MG PO CAPS: 300.0000 mg | ORAL_CAPSULE | Freq: Three times a day (TID) | ORAL | Status: DC

## 2012-03-13 NOTE — Progress Notes (Signed)
  Subjective:    Patient ID: Rachel Bryan, female    DOB: 1950-04-02, 62 y.o.   MRN: [id number]  HPI  62 year old F with lef arm pain and headache. Her primary concern if her left arm pain.   Arm pain - Located in the left upper extremity proximal to the elbow. She notes that it is started a few weeks ago when she had an injection in a cyst on her left. It is described as severe stabbing pain that occurs when she reaches her arm behind her back. It starts in the area of cyst and radiates proximally towards the shoulder, but does not reach the shoulder. It does not hurt when she is at rest. She has tried ibuprofen and exercise, without relieft of the pain. It is not associated with swelling or redness. She denies a history of left arm injury or surgery.    Review of Systems Positive for headache and tremulousness    Objective:   Physical Exam BP 136/84  Pulse 88  Ht 5\' 6"  (1.676 m)  Wt 146 lb (66.225 kg)  BMI 23.58 kg/m2 Gen: thin, white female, mild distress Left arm - Exquisite TTP of left bicep without swelling, erythema, or warmth; normal flexion and extension of elbow; small palpable subcutaneous nodule  Shoulder Exam - left Inspection: normal Palpation:   Clavicle: WNL   AC Joint: WNL  Scapula: WNL             Biceps Tendon: no pain ROM/Strength:  Abduction (Suprapsinatus): normal  Internal Rotation/Liftoff (Subsapularis):normal  External Rotation (Inraspinatus/Teres Minor): limited Maneuvers:   Neer's: normal  Hawkin's:normal  Empty Can: normal  Drop Arm: normal       Assessment & Plan:  62 year old F with severe left upper arm pain after receiving a steroid injection of a sebaceous cyst.

## 2012-03-13 NOTE — Patient Instructions (Signed)
Dear Mrs. Venuto,   Thank you for coming to clinic today. Please read below regarding the issues that we discussed.   Left Arm Pain - I think that this pain may be nerve related. So I would like to try a medication called neurontin for a few weeks to see if it improves. Start by taking 300 mg at night. After a few days, you can increase to 300 mg in the morning if it doesn't make you too tired. Also, I suggest trying topical capsaicin cream.   Please follow up with Dr. Konrad Dolores in clinic in 2 weeks. Please call earlier if you have any questions or concerns.   Sincerely,   Dr. Clinton Sawyer

## 2012-03-13 NOTE — Telephone Encounter (Signed)
Called pt who was in clinic at time of call for clinic appt.  Pt would do well to have Meloxicam for MSK pain Pt informed of radiology report and need for f/u imaging in 6 mo for renal hypodensity noted at [facility]. Called and spoke to Dr. Clinton Sawyer who is seeing pt in clinic today. Appreciate his care/input  Shelly Flatten, MD Family Medicine PGY-2 03/13/2012, 3:06 PM

## 2012-03-15 DIAGNOSIS — M79622 Pain in left upper arm: Secondary | ICD-10-CM | POA: Insufficient documentation

## 2012-03-15 NOTE — Assessment & Plan Note (Signed)
After thorough examination, there was no evidence of shoulder pain or pathology. The pain is localized to the anterior left upper arm between the shoulder and elbow and is of unclear etiology. There no evidence of infection and is unlikely myositis as it is not painful at rest. It could be a complex regional pain syndrome started by the injection, however, that would be a diagnosis of exclusion. At this point, we will treat it as if it is an inflamed nerve of the LUE. She will start with neurontin 300 mg daily and titrate up. She was also instructed to use capsaicin cream. The case was discussed at length with Dr. Denny Levy, who also evaluated the patient. She is in agreement with this plan.

## 2012-03-18 ENCOUNTER — Ambulatory Visit: Payer: Self-pay | Admitting: Family Medicine

## 2012-03-25 ENCOUNTER — Ambulatory Visit: Payer: Self-pay | Admitting: Family Medicine

## 2012-03-25 ENCOUNTER — Telehealth: Payer: Self-pay | Admitting: Family Medicine

## 2012-03-25 NOTE — Telephone Encounter (Signed)
Please call pt and inform her that changing to BID is fine. Can even go down to QHS after one week if she would like

## 2012-03-25 NOTE — Telephone Encounter (Signed)
Patient is calling because she doesn't like the side effects that the Neurontin is giving to her and she would like to change it from 3 times a day to 2 times a day.

## 2012-03-26 NOTE — Telephone Encounter (Signed)
Pt informed. Rachel Bryan, Rachel Bryan  

## 2012-04-02 ENCOUNTER — Ambulatory Visit (INDEPENDENT_AMBULATORY_CARE_PROVIDER_SITE_OTHER): Payer: Self-pay | Admitting: Family Medicine

## 2012-04-02 ENCOUNTER — Encounter: Payer: Self-pay | Admitting: Family Medicine

## 2012-04-02 VITALS — BP 144/82 | HR 97 | Temp 98.2°F | Ht 66.0 in | Wt 141.0 lb

## 2012-04-02 DIAGNOSIS — D367 Benign neoplasm of other specified sites: Secondary | ICD-10-CM

## 2012-04-02 DIAGNOSIS — D2360 Other benign neoplasm of skin of unspecified upper limb, including shoulder: Secondary | ICD-10-CM

## 2012-04-02 DIAGNOSIS — R251 Tremor, unspecified: Secondary | ICD-10-CM

## 2012-04-02 DIAGNOSIS — F329 Major depressive disorder, single episode, unspecified: Secondary | ICD-10-CM

## 2012-04-02 DIAGNOSIS — F411 Generalized anxiety disorder: Secondary | ICD-10-CM

## 2012-04-02 DIAGNOSIS — M79609 Pain in unspecified limb: Secondary | ICD-10-CM

## 2012-04-02 DIAGNOSIS — F419 Anxiety disorder, unspecified: Secondary | ICD-10-CM

## 2012-04-02 MED ORDER — PREDNISONE 50 MG PO TABS: 50.0000 mg | ORAL_TABLET | Freq: Every day | ORAL | Status: DC

## 2012-04-02 MED ORDER — PROPRANOLOL HCL 10 MG PO TABS: 10.0000 mg | ORAL_TABLET | Freq: Two times a day (BID) | ORAL | Status: DC

## 2012-04-02 NOTE — Assessment & Plan Note (Addendum)
Likely some impingement on nerves. Prednisone 50 for 5 days for possible inflammatory component Will need excision to fully resolve.  Will consider referral if continues to worsen

## 2012-04-02 NOTE — Patient Instructions (Addendum)
Thank you for coming in today.  I think a great deal of your pains are related to your anxiety and depression Please start your prednisone. THis should help with your arm pain Please start taking propranolol. This will help with yoru tremor adn blood pressure Please restart yoru celexa. Increase your dose to 40mg  daily after 7 days.  Please come back to see me in 4 weeks, or sooner if needed.

## 2012-04-02 NOTE — Progress Notes (Signed)
Rachel Bryan is a 62 y.o. female who presents to [facility] today for left arm pain  Left arm pain: Multiple previous evaluations for this pain. No relief from prevous injections. Getting worse. Gabapentin started at last appt w/o benefit. Taking 300mg  BID since 03/21/12. Unable to use L arm in a meaningful way secondary to "cutting" pain. Endorses loss of sensation and strength in L hand.    R hand shaking: Started 3-4 weeks ago. Comes and goes. No identifyable triggers. No aggrevating factors. Decreased strength. Requiring help to wash and dress self. Stopped celexa 3-4 weeks ago.   Depression: stopped celexa 3-4 weeks ago. Due to concern for tremor.   Tobacco: now down to 1/2ppd  The following portions of the patient's history were reviewed and updated as appropriate: allergies, current medications, past medical history, family and social history, and problem list.  Patient is a smoker  Past Medical History  Diagnosis Date  . Cluster headache   . Anxiety   . Insomnia secondary to anxiety   . Incontinence of urine     ROS as above otherwise neg.    Medications reviewed. Current Outpatient Prescriptions  Medication Sig Dispense Refill  . butalbital-acetaminophen-caffeine (FIORICET) 50-325-40 MG per tablet Take 1-2 tablets by mouth every 6 (six) hours as needed for headache.  20 tablet  0  . citalopram (CELEXA) 20 MG tablet Take 1 tablet (20 mg total) by mouth daily.  30 tablet  3  . gabapentin (NEURONTIN) 300 MG capsule Take 1 capsule (300 mg total) by mouth 3 (three) times daily.  90 capsule  0  . hydrOXYzine (ATARAX/VISTARIL) 10 MG tablet Take 1 tablet (10 mg total) by mouth at bedtime as needed for anxiety.  30 tablet  0   No current facility-administered medications for this visit.    Exam: BP 144/82  Pulse 97  Temp(Src) 98.2 F (36.8 C) (Oral)  Ht 5\' 6"  (1.676 m)  Wt 141 lb (63.957 kg)  BMI 22.77 kg/m2 Gen: Well NAD HEENT: EOMI,  MMM MSK: L bicep w/ well circumscribed,  moveable cystic lesion. No cervical pain w/ movement. FROM or L arm/shoulder. ttp w/ palpation of L bicep lesion.  Neuro: CN 2-12 grossly intact, slight tremor of the r hand and arm sometimes w/ rest but primarily w/ intention.   No results found for this or any previous visit (from the past 72 hour(s)).

## 2012-04-08 NOTE — Assessment & Plan Note (Signed)
Strong component of her overall symptoms is her anxiety Restart celexa

## 2012-04-08 NOTE — Assessment & Plan Note (Signed)
See dermoid cyst of arm

## 2012-04-08 NOTE — Assessment & Plan Note (Addendum)
Pt stopped celexa Discussed risks and benefits and pt to restart.  Will increase to 40mg  after 1 week

## 2012-04-08 NOTE — Assessment & Plan Note (Signed)
Likely essential tremor.  WIll start low dose propranolol BID as also hypertensive today Likely also anxiety related.  No neurological deficits.

## 2012-05-07 ENCOUNTER — Ambulatory Visit: Payer: Self-pay | Admitting: Family Medicine

## 2012-05-13 ENCOUNTER — Ambulatory Visit (INDEPENDENT_AMBULATORY_CARE_PROVIDER_SITE_OTHER): Payer: Self-pay | Admitting: Family Medicine

## 2012-05-13 ENCOUNTER — Encounter: Payer: Self-pay | Admitting: *Deleted

## 2012-05-13 VITALS — BP 129/77 | HR 99 | Ht 67.0 in | Wt 143.0 lb

## 2012-05-13 DIAGNOSIS — M79609 Pain in unspecified limb: Secondary | ICD-10-CM

## 2012-05-13 DIAGNOSIS — R251 Tremor, unspecified: Secondary | ICD-10-CM

## 2012-05-13 DIAGNOSIS — F329 Major depressive disorder, single episode, unspecified: Secondary | ICD-10-CM

## 2012-05-13 DIAGNOSIS — R259 Unspecified abnormal involuntary movements: Secondary | ICD-10-CM

## 2012-05-13 DIAGNOSIS — M79622 Pain in left upper arm: Secondary | ICD-10-CM

## 2012-05-13 MED ORDER — CITALOPRAM HYDROBROMIDE 40 MG PO TABS: 40.0000 mg | ORAL_TABLET | Freq: Every day | ORAL | Status: DC

## 2012-05-13 NOTE — Patient Instructions (Addendum)
Thank you for coming into clinic today Please take the Celexa prescription to [facility], [facility], or [facility] to get it filled Please do your exercises daily!!! Your sluggish feeling and depression should improve once you start back on the celexa. Please come back in 2 weeks or sooner if needed.   Biceps Tendon Tendinitis (Distal) with Rehab Tendinitis involves inflammation and pain over the affected tendon. The distal biceps tendon (near the elbow) is vulnerable to tendinitis. Distal biceps tendonitis is usually due to the bony bump near the elbow (bicipital tuberosity) causing increased friction over the tendon. The biceps tendon attaches the biceps muscle to one bone in the elbow and two in the shoulder. It is important for proper function of the elbow and for turning the palm upward (supination). SYMPTOMS   Pain, aching, tenderness, and sometimes warmth or redness over the front of the elbow.  Pain when bending the elbow or turning the palm up, using the wrist, especially if performed against resistance.  Crackling sound (crepitation) when the tendon or elbow is moved or touched. CAUSES  The symptoms of biceps tendonitis are due to inflammation of the tendon. Inflammation may be caused by:  Strain from sudden increase in amount or intensity of activity.  Direct blow or injury to the elbow (uncommon).  Overuse or repetitive elbow bending or wrist rotation, particularly when turning the palm up, or with elbow hyperextension. RISK INCREASES WITH:  Sports that involve contact or overhead arm activity (throwing sports, gymnastics, weightlifting, bodybuilding, rock climbing).  Heavy labor.  Poor strength and flexibility.  Failure to warm up properly before activity.  Injury to other structures of the elbow.  Restraint of the elbow. PREVENTION  Warm up and stretch properly before activity.  Allow time for recovery between activities.  Maintain physical fitness:  Strength,  flexibility, and endurance.  Cardiovascular fitness.  Learn and use proper exercise technique. PROGNOSIS  With proper treatment, biceps tendon tendonitis is usually curable within 6 weeks.  RELATED COMPLICATIONS   Longer healing time if not properly treated or if not given enough time to heal.  Chronically inflamed tendon that causes persistent pain with activity, that may progress to constant pain and potentially rupture of the tendon.  Recurring symptoms, especially if activity is resumed too soon, with overuse or with poor technique. TREATMENT  Treatment first involves ice and medicine to reduce pain and inflammation. Modify activities that cause pain, to reduce the chances of causing the condition to get worse. Strengthening and stretching exercises should be performed to promote proper use of the muscles of the elbow. These exercise may be performed at home or with a therapist. Other treatments may be given such as ultrasound or heat therapy. Surgery is usually not recommended.  MEDICATION  If pain medicine is needed, nonsteroidal anti-inflammatory medicines (aspirin and ibuprofen), or other minor pain relievers (acetaminophen), are often advised.  Do not take pain relieving medication for 7 days before surgery.  Prescription pain relievers may be given if your caregiver thinks they are needed. Use only as directed and only as much as you need. HEAT AND COLD:   Cold treatment (icing) should be applied for 10 to 15 minutes every 2 to 3 hours for inflammation and pain, and immediately after activity that aggravates your symptoms. Use ice packs or an ice massage.  Heat treatment may be used before performing stretching and strengthening activities prescribed by your caregiver, physical therapist, or athletic trainer. Use a heat pack or a warm water soak.  SEEK MEDICAL CARE IF:   Symptoms get worse or do not improve in 2 weeks, despite treatment.  New, unexplained symptoms develop.  (Drugs used in treatment may produce side effects.) EXERCISES  RANGE OF MOTION (ROM) AND STRETCHING EXERCISES - Biceps Tendon Tendinitis (Distal) These exercises may help you when beginning to rehabilitate your injury. Your symptoms may go away with or without further involvement from your physician, physical therapist, or athletic trainer. While completing these exercises, remember:   Restoring tissue flexibility helps normal motion to return to the joints. This allows healthier, less painful movement and activity.  An effective stretch should be held for at least 30 seconds.  A stretch should never be painful. You should only feel a gentle lengthening or release in the stretched tissue. STRETCH  Elbow Flexors   Lie on a firm bed or countertop on your back. Be sure that you are in a comfortable position which will allow you to relax your arm muscles.  Place a folded towel under your right / left upper arm, so that your elbow and shoulder are at the same height. Extend your arm; your elbow should not rest on the bed or towel.  Allow the weight of your hand to straighten your elbow. Keep your arm and chest muscles relaxed. Your caretaker may ask you to increase the intensity of your stretch by adding a small wrist or hand weight.  Hold for __________ seconds. You should feel a stretch on the inside of your elbow. Slowly return to the starting position. Repeat __________ times. Complete this exercise __________ times per day. RANGE OF MOTION  Supination, Active   Stand or sit with your elbows at your side. Bend your right / left elbow to 90 degrees.  Turn your palm upward until you feel a gentle stretch on the inside of your forearm.  Hold this position for __________ seconds. Slowly release and return to the starting position. Repeat __________ times. Complete this stretch __________ times per day.  RANGE OF MOTION  Pronation, Active   Stand or sit with your elbows at your side. Bend  your right / left elbow to 90 degrees.  Turn your palm downward until you feel a gentle stretch on the top of your forearm.  Hold this position for __________ seconds. Slowly release and return to the starting position. Repeat __________ times. Complete this stretch __________ times per day.  STRENGTHENING EXERCISES - Biceps Tendon Tendinitis (Distal) These exercises may help you when beginning to rehabilitate your injury. They may resolve your symptoms with or without further involvement from your physician, physical therapist or athletic trainer. While completing these exercises, remember:   Muscles can gain both the endurance and the strength needed for everyday activities through controlled exercises.  Complete these exercises as instructed by your physician, physical therapist or athletic trainer. Increase the resistance and repetitions only as guided.  You may experience muscle soreness or fatigue, but the pain or discomfort you are trying to eliminate should never get worse during these exercises. If this pain does get worse, stop and make sure you are following the directions exactly. If the pain is still present after adjustments, discontinue the exercise until you can discuss the trouble with your clinician. STRENGTH - Elbow Flexors, Isometric   Stand or sit upright on a firm surface. Place your right / left arm so that your hand is palm-up and at the height of your waist.  Place your opposite hand on top of your forearm. Gently push  down as your right / left arm resists. Push as hard as you can with both arms without causing any pain or movement at your right / left elbow. Hold this stationary position for __________ seconds.  Gradually release the tension in both arms. Allow your muscles to relax completely before repeating. Repeat __________ times. Complete this exercise __________ times per day. STRENGTH  Forearm Supinators   Sit with your right / left forearm supported on a  table, keeping your elbow below shoulder height. Rest your hand over the edge, palm down.  Gently grip a hammer or a soup ladle.  Without moving your elbow, slowly turn your palm and hand upward to a "thumbs-up" position.  Hold this position for __________ seconds. Slowly return to the starting position. Repeat __________ times. Complete this exercise __________ times per day.  STRENGTH  Forearm Pronators   Sit with your right / left forearm supported on a table, keeping your elbow below shoulder height. Rest your hand over the edge, palm up.  Gently grip a hammer or a soup ladle.  Without moving your elbow, slowly turn your palm and hand upward to a "thumbs-up" position.  Hold this position for __________ seconds. Slowly return to the starting position. Repeat __________ times. Complete this exercise __________ times per day.  STRENGTH  Elbow Flexors, Supinated  With good posture, stand or sit on a firm chair without armrests. Allow your right / left arm to rest at your side with your palm facing forward.  Holding a __________ weight, or gripping a rubber exercise band or tubing, bring your hand toward your shoulder.  Allow your muscles to control the resistance as your hand returns to your side. Repeat __________ times. Complete this exercise __________ times per day.  STRENGTH  Elbow Flexors, Neutral  With good posture, stand or sit on a firm chair without armrests. Allow your right / left arm to rest at your side with your thumb facing forward.  Holding a __________ weight, or gripping a rubber exercise band or tubing, bring your hand toward your shoulder.  Allow your muscles to control the resistance as your hand returns to your side. Repeat __________ times. Complete this exercise __________ times per day.  Document Released: 01/09/2005 Document Revised: 04/03/2011 Document Reviewed: 04/23/2008 [facility] Patient Information 2013 [place], [place]. Impingement Syndrome, Rotator  Cuff, Bursitis with Rehab Impingement syndrome is a condition that involves inflammation of the tendons of the rotator cuff and the subacromial bursa, that causes pain in the shoulder. The rotator cuff consists of four tendons and muscles that control much of the shoulder and upper arm function. The subacromial bursa is a fluid filled sac that helps reduce friction between the rotator cuff and one of the bones of the shoulder (acromion). Impingement syndrome is usually an overuse injury that causes swelling of the bursa (bursitis), swelling of the tendon (tendonitis), and/or a tear of the tendon (strain). Strains are classified into three categories. Grade 1 strains cause pain, but the tendon is not lengthened. Grade 2 strains include a lengthened ligament, due to the ligament being stretched or partially ruptured. With grade 2 strains there is still function, although the function may be decreased. Grade 3 strains include a complete tear of the tendon or muscle, and function is usually impaired. SYMPTOMS   Pain around the shoulder, often at the outer portion of the upper arm.  Pain that gets worse with shoulder function, especially when reaching overhead or lifting.  Sometimes, aching when not using the  arm.  Pain that wakes you up at night.  Sometimes, tenderness, swelling, warmth, or redness over the affected area.  Loss of strength.  Limited motion of the shoulder, especially reaching behind the back (to the back pocket or to unhook bra) or across your body.  Crackling sound (crepitation) when moving the arm.  Biceps tendon pain and inflammation (in the front of the shoulder). Worse when bending the elbow or lifting. CAUSES  Impingement syndrome is often an overuse injury, in which chronic (repetitive) motions cause the tendons or bursa to become inflamed. A strain occurs when a force is paced on the tendon or muscle that is greater than it can withstand. Common mechanisms of injury  include: Stress from sudden increase in duration, frequency, or intensity of training.  Direct hit (trauma) to the shoulder.  Aging, erosion of the tendon with normal use.  Bony bump on shoulder (acromial spur). RISK INCREASES WITH:  Contact sports (football, wrestling, boxing).  Throwing sports (baseball, tennis, volleyball).  Weightlifting and bodybuilding.  Heavy labor.  Previous injury to the rotator cuff, including impingement.  Poor shoulder strength and flexibility.  Failure to warm up properly before activity.  Inadequate protective equipment.  Old age.  Bony bump on shoulder (acromial spur). PREVENTION   Warm up and stretch properly before activity.  Allow for adequate recovery between workouts.  Maintain physical fitness:  Strength, flexibility, and endurance.  Cardiovascular fitness.  Learn and use proper exercise technique. PROGNOSIS  If treated properly, impingement syndrome usually goes away within 6 weeks. Sometimes surgery is required.  RELATED COMPLICATIONS   Longer healing time if not properly treated, or if not given enough time to heal.  Recurring symptoms, that result in a chronic condition.  Shoulder stiffness, frozen shoulder, or loss of motion.  Rotator cuff tendon tear.  Recurring symptoms, especially if activity is resumed too soon, with overuse, with a direct blow, or when using poor technique. TREATMENT  Treatment first involves the use of ice and medicine, to reduce pain and inflammation. The use of strengthening and stretching exercises may help reduce pain with activity. These exercises may be performed at home or with a therapist. If non-surgical treatment is unsuccessful after more than 6 months, surgery may be advised. After surgery and rehabilitation, activity is usually possible in 3 months.  MEDICATION  If pain medicine is needed, nonsteroidal anti-inflammatory medicines (aspirin and ibuprofen), or other minor pain  relievers (acetaminophen), are often advised.  Do not take pain medicine for 7 days before surgery.  Prescription pain relievers may be given, if your caregiver thinks they are needed. Use only as directed and only as much as you need.  Corticosteroid injections may be given by your caregiver. These injections should be reserved for the most serious cases, because they may only be given a certain number of times. HEAT AND COLD  Cold treatment (icing) should be applied for 10 to 15 minutes every 2 to 3 hours for inflammation and pain, and immediately after activity that aggravates your symptoms. Use ice packs or an ice massage.  Heat treatment may be used before performing stretching and strengthening activities prescribed by your caregiver, physical therapist, or athletic trainer. Use a heat pack or a warm water soak. SEEK MEDICAL CARE IF:   Symptoms get worse or do not improve in 4 to 6 weeks, despite treatment.  New, unexplained symptoms develop. (Drugs used in treatment may produce side effects.) EXERCISES  RANGE OF MOTION (ROM) AND STRETCHING EXERCISES - Impingement  Syndrome (Rotator Cuff  Tendinitis, Bursitis) These exercises may help you when beginning to rehabilitate your injury. Your symptoms may go away with or without further involvement from your physician, physical therapist or athletic trainer. While completing these exercises, remember:   Restoring tissue flexibility helps normal motion to return to the joints. This allows healthier, less painful movement and activity.  An effective stretch should be held for at least 30 seconds.  A stretch should never be painful. You should only feel a gentle lengthening or release in the stretched tissue. STRETCH  Flexion, Standing  Stand with good posture. With an underhand grip on your right / left hand, and an overhand grip on the opposite hand, grasp a broomstick or cane so that your hands are a little more than shoulder width  apart.  Keeping your right / left elbow straight and shoulder muscles relaxed, push the stick with your opposite hand, to raise your right / left arm in front of your body and then overhead. Raise your arm until you feel a stretch in your right / left shoulder, but before you have increased shoulder pain.  Try to avoid shrugging your right / left shoulder as your arm rises, by keeping your shoulder blade tucked down and toward your mid-back spine. Hold for __________ seconds.  Slowly return to the starting position. Repeat __________ times. Complete this exercise __________ times per day. STRETCH  Abduction, Supine  Lie on your back. With an underhand grip on your right / left hand and an overhand grip on the opposite hand, grasp a broomstick or cane so that your hands are a little more than shoulder width apart.  Keeping your right / left elbow straight and your shoulder muscles relaxed, push the stick with your opposite hand, to raise your right / left arm out to the side of your body and then overhead. Raise your arm until you feel a stretch in your right / left shoulder, but before you have increased shoulder pain.  Try to avoid shrugging your right / left shoulder as your arm rises, by keeping your shoulder blade tucked down and toward your mid-back spine. Hold for __________ seconds.  Slowly return to the starting position. Repeat __________ times. Complete this exercise __________ times per day. ROM  Flexion, Active-Assisted  Lie on your back. You may bend your knees for comfort.  Grasp a broomstick or cane so your hands are about shoulder width apart. Your right / left hand should grip the end of the stick, so that your hand is positioned "thumbs-up," as if you were about to shake hands.  Using your healthy arm to lead, raise your right / left arm overhead, until you feel a gentle stretch in your shoulder. Hold for __________ seconds.  Use the stick to assist in returning your right  / left arm to its starting position. Repeat __________ times. Complete this exercise __________ times per day.  ROM - Internal Rotation, Supine   Lie on your back on a firm surface. Place your right / left elbow about 60 degrees away from your side. Elevate your elbow with a folded towel, so that the elbow and shoulder are the same height.  Using a broomstick or cane and your strong arm, pull your right / left hand toward your body until you feel a gentle stretch, but no increase in your shoulder pain. Keep your shoulder and elbow in place throughout the exercise.  Hold for __________ seconds. Slowly return to the starting position.  Repeat __________ times. Complete this exercise __________ times per day. STRETCH - Internal Rotation  Place your right / left hand behind your back, palm up.  Throw a towel or belt over your opposite shoulder. Grasp the towel with your right / left hand.  While keeping an upright posture, gently pull up on the towel, until you feel a stretch in the front of your right / left shoulder.  Avoid shrugging your right / left shoulder as your arm rises, by keeping your shoulder blade tucked down and toward your mid-back spine.  Hold for __________ seconds. Release the stretch, by lowering your healthy hand. Repeat __________ times. Complete this exercise __________ times per day. ROM - Internal Rotation   Using an underhand grip, grasp a stick behind your back with both hands.  While standing upright with good posture, slide the stick up your back until you feel a mild stretch in the front of your shoulder.  Hold for __________ seconds. Slowly return to your starting position. Repeat __________ times. Complete this exercise __________ times per day.  STRETCH  Posterior Shoulder Capsule   Stand or sit with good posture. Grasp your right / left elbow and draw it across your chest, keeping it at the same height as your shoulder.  Pull your elbow, so your upper  arm comes in closer to your chest. Pull until you feel a gentle stretch in the back of your shoulder.  Hold for __________ seconds. Repeat __________ times. Complete this exercise __________ times per day. STRENGTHENING EXERCISES - Impingement Syndrome (Rotator Cuff Tendinitis, Bursitis) These exercises may help you when beginning to rehabilitate your injury. They may resolve your symptoms with or without further involvement from your physician, physical therapist or athletic trainer. While completing these exercises, remember:  Muscles can gain both the endurance and the strength needed for everyday activities through controlled exercises.  Complete these exercises as instructed by your physician, physical therapist or athletic trainer. Increase the resistance and repetitions only as guided.  You may experience muscle soreness or fatigue, but the pain or discomfort you are trying to eliminate should never worsen during these exercises. If this pain does get worse, stop and make sure you are following the directions exactly. If the pain is still present after adjustments, discontinue the exercise until you can discuss the trouble with your clinician.  During your recovery, avoid activity or exercises which involve actions that place your injured hand or elbow above your head or behind your back or head. These positions stress the tissues which you are trying to heal. STRENGTH - Scapular Depression and Adduction   With good posture, sit on a firm chair. Support your arms in front of you, with pillows, arm rests, or on a table top. Have your elbows in line with the sides of your body.  Gently draw your shoulder blades down and toward your mid-back spine. Gradually increase the tension, without tensing the muscles along the top of your shoulders and the back of your neck.  Hold for __________ seconds. Slowly release the tension and relax your muscles completely before starting the next  repetition.  After you have practiced this exercise, remove the arm support and complete the exercise in standing as well as sitting position. Repeat __________ times. Complete this exercise __________ times per day.  STRENGTH - Shoulder Abductors, Isometric  With good posture, stand or sit about 4-6 inches from a wall, with your right / left side facing the wall.  Bend your right /  left elbow. Gently press your right / left elbow into the wall. Increase the pressure gradually, until you are pressing as hard as you can, without shrugging your shoulder or increasing any shoulder discomfort.  Hold for __________ seconds.  Release the tension slowly. Relax your shoulder muscles completely before you begin the next repetition. Repeat __________ times. Complete this exercise __________ times per day.  STRENGTH - External Rotators, Isometric  Keep your right / left elbow at your side and bend it 90 degrees.  Step into a door frame so that the outside of your right / left wrist can press against the door frame without your upper arm leaving your side.  Gently press your right / left wrist into the door frame, as if you were trying to swing the back of your hand away from your stomach. Gradually increase the tension, until you are pressing as hard as you can, without shrugging your shoulder or increasing any shoulder discomfort.  Hold for __________ seconds.  Release the tension slowly. Relax your shoulder muscles completely before you begin the next repetition. Repeat __________ times. Complete this exercise __________ times per day.  STRENGTH - Supraspinatus   Stand or sit with good posture. Grasp a __________ weight, or an exercise band or tubing, so that your hand is "thumbs-up," like you are shaking hands.  Slowly lift your right / left arm in a "V" away from your thigh, diagonally into the space between your side and straight ahead. Lift your hand to shoulder height or as far as you can,  without increasing any shoulder pain. At first, many people do not lift their hands above shoulder height.  Avoid shrugging your right / left shoulder as your arm rises, by keeping your shoulder blade tucked down and toward your mid-back spine.  Hold for __________ seconds. Control the descent of your hand, as you slowly return to your starting position. Repeat __________ times. Complete this exercise __________ times per day.  STRENGTH - External Rotators  Secure a rubber exercise band or tubing to a fixed object (table, pole) so that it is at the same height as your right / left elbow when you are standing or sitting on a firm surface.  Stand or sit so that the secured exercise band is at your uninjured side.  Bend your right / left elbow 90 degrees. Place a folded towel or small pillow under your right / left arm, so that your elbow is a few inches away from your side.  Keeping the tension on the exercise band, pull it away from your body, as if pivoting on your elbow. Be sure to keep your body steady, so that the movement is coming only from your rotating shoulder.  Hold for __________ seconds. Release the tension in a controlled manner, as you return to the starting position. Repeat __________ times. Complete this exercise __________ times per day.  STRENGTH - Internal Rotators   Secure a rubber exercise band or tubing to a fixed object (table, pole) so that it is at the same height as your right / left elbow when you are standing or sitting on a firm surface.  Stand or sit so that the secured exercise band is at your right / left side.  Bend your elbow 90 degrees. Place a folded towel or small pillow under your right / left arm so that your elbow is a few inches away from your side.  Keeping the tension on the exercise band, pull it across your  body, toward your stomach. Be sure to keep your body steady, so that the movement is coming only from your rotating shoulder.  Hold for  __________ seconds. Release the tension in a controlled manner, as you return to the starting position. Repeat __________ times. Complete this exercise __________ times per day.  STRENGTH - Scapular Protractors, Standing   Stand arms length away from a wall. Place your hands on the wall, keeping your elbows straight.  Begin by dropping your shoulder blades down and toward your mid-back spine.  To strengthen your protractors, keep your shoulder blades down, but slide them forward on your rib cage. It will feel as if you are lifting the back of your rib cage away from the wall. This is a subtle motion and can be challenging to complete. Ask your caregiver for further instruction, if you are not sure you are doing the exercise correctly.  Hold for __________ seconds. Slowly return to the starting position, resting the muscles completely before starting the next repetition. Repeat __________ times. Complete this exercise __________ times per day. STRENGTH - Scapular Protractors, Supine  Lie on your back on a firm surface. Extend your right / left arm straight into the air while holding a __________ weight in your hand.  Keeping your head and back in place, lift your shoulder off the floor.  Hold for __________ seconds. Slowly return to the starting position, and allow your muscles to relax completely before starting the next repetition. Repeat __________ times. Complete this exercise __________ times per day. STRENGTH - Scapular Protractors, Quadruped  Get onto your hands and knees, with your shoulders directly over your hands (or as close as you can be, comfortably).  Keeping your elbows locked, lift the back of your rib cage up into your shoulder blades, so your mid-back rounds out. Keep your neck muscles relaxed.  Hold this position for __________ seconds. Slowly return to the starting position and allow your muscles to relax completely before starting the next repetition. Repeat __________  times. Complete this exercise __________ times per day.  STRENGTH - Scapular Retractors  Secure a rubber exercise band or tubing to a fixed object (table, pole), so that it is at the height of your shoulders when you are either standing, or sitting on a firm armless chair.  With a palm down grip, grasp an end of the band in each hand. Straighten your elbows and lift your hands straight in front of you, at shoulder height. Step back, away from the secured end of the band, until it becomes tense.  Squeezing your shoulder blades together, draw your elbows back toward your sides, as you bend them. Keep your upper arms lifted away from your body throughout the exercise.  Hold for __________ seconds. Slowly ease the tension on the band, as you reverse the directions and return to the starting position. Repeat __________ times. Complete this exercise __________ times per day. STRENGTH - Shoulder Extensors   Secure a rubber exercise band or tubing to a fixed object (table, pole) so that it is at the height of your shoulders when you are either standing, or sitting on a firm armless chair.  With a thumbs-up grip, grasp an end of the band in each hand. Straighten your elbows and lift your hands straight in front of you, at shoulder height. Step back, away from the secured end of the band, until it becomes tense.  Squeezing your shoulder blades together, pull your hands down to the sides of your thighs.  Do not allow your hands to go behind you.  Hold for __________ seconds. Slowly ease the tension on the band, as you reverse the directions and return to the starting position. Repeat __________ times. Complete this exercise __________ times per day.  STRENGTH - Scapular Retractors and External Rotators   Secure a rubber exercise band or tubing to a fixed object (table, pole) so that it is at the height as your shoulders, when you are either standing, or sitting on a firm armless chair.  With a palm down  grip, grasp an end of the band in each hand. Bend your elbows 90 degrees and lift your elbows to shoulder height, at your sides. Step back, away from the secured end of the band, until it becomes tense.  Squeezing your shoulder blades together, rotate your shoulders so that your upper arms and elbows remain stationary, but your fists travel upward to head height.  Hold for __________ seconds. Slowly ease the tension on the band, as you reverse the directions and return to the starting position. Repeat __________ times. Complete this exercise __________ times per day.  STRENGTH - Scapular Retractors and External Rotators, Rowing   Secure a rubber exercise band or tubing to a fixed object (table, pole) so that it is at the height of your shoulders, when you are either standing, or sitting on a firm armless chair.  With a palm down grip, grasp an end of the band in each hand. Straighten your elbows and lift your hands straight in front of you, at shoulder height. Step back, away from the secured end of the band, until it becomes tense.  Step 1: Squeeze your shoulder blades together. Bending your elbows, draw your hands to your chest, as if you are rowing a boat. At the end of this motion, your hands and elbow should be at shoulder height and your elbows should be out to your sides.  Step 2: Rotate your shoulders, to raise your hands above your head. Your forearms should be vertical and your upper arms should be horizontal.  Hold for __________ seconds. Slowly ease the tension on the band, as you reverse the directions and return to the starting position. Repeat __________ times. Complete this exercise __________ times per day.  STRENGTH  Scapular Depressors  Find a sturdy chair without wheels, such as a dining room chair.  Keeping your feet on the floor, and your hands on the chair arms, lift your bottom up from the seat, and lock your elbows.  Keeping your elbows straight, allow gravity to pull  your body weight down. Your shoulders will rise toward your ears.  Raise your body against gravity by drawing your shoulder blades down your back, shortening the distance between your shoulders and ears. Although your feet should always maintain contact with the floor, your feet should progressively support less body weight, as you get stronger.  Hold for __________ seconds. In a controlled and slow manner, lower your body weight to begin the next repetition. Repeat __________ times. Complete this exercise __________ times per day.  Document Released: 01/09/2005 Document Revised: 04/03/2011 Document Reviewed: 04/23/2008 [facility] Patient Information 2013 [place], [place].

## 2012-05-13 NOTE — Progress Notes (Signed)
Rachel Bryan is a 62 y.o. female who presents to [facility] today for arm pain sluggish  Tremor: resolved. Taking propranolol as prescribed.  Feeling sluggish: started feeling sluggish about 1 wk. ran out of celexa 14 days ago. Can't afford celexa as it cost ~$50.   Arm pain: capsicin and gabapentin not helping w/ arm pain. Feels like someone is cutting her arm off w/ certain movements. Minimal to no pain w/o movement. Initially improved w/ prednisone 50mg  Q5 days. Ibuprofen w/ some relief. Not doing daily exercises. 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   Lump on chest 1 wk ago. Irritation and painful. No breast lumps.   Tobacco 1/2 ppd. Trying to quit.   The following portions of the patient's history were reviewed and updated as appropriate: allergies, current medications, past medical history, family and social history, and problem list.  Patient is a smoker  Past Medical History  Diagnosis Date  . Cluster headache   . Anxiety   . Insomnia secondary to anxiety   . Incontinence of urine     ROS as above otherwise neg.    Medications reviewed. Current Outpatient Prescriptions  Medication Sig Dispense Refill  . butalbital-acetaminophen-caffeine (FIORICET) 50-325-40 MG per tablet Take 1-2 tablets by mouth every 6 (six) hours as needed for headache.  20 tablet  0  . citalopram (CELEXA) 20 MG tablet Take 1 tablet (20 mg total) by mouth daily.  30 tablet  3  . gabapentin (NEURONTIN) 300 MG capsule Take 1 capsule (300 mg total) by mouth 3 (three) times daily.  90 capsule  0  . hydrOXYzine (ATARAX/VISTARIL) 10 MG tablet Take 1 tablet (10 mg total) by mouth at bedtime as needed for anxiety.  30 tablet  0  . predniSONE (DELTASONE) 50 MG tablet Take 1 tablet (50 mg total) by mouth daily.  5 tablet  0  . propranolol (INDERAL) 10 MG tablet Take 1 tablet (10 mg total) by mouth 2 (two) times daily.  60 tablet  3   No current  facility-administered medications for this visit.    Exam:  BP 129/77  Pulse 99  Ht 5\' 7"  (1.702 m)  Wt 143 lb (64.864 kg)  BMI 22.39 kg/m2 Gen: Well NAD HEENT: EOMI,  MMM Skin: No abnormality on skin. Intact and w/o rash. Warm and well perfused.  MSK: FROM, ttp along the distal bicep w/ a moveable well circumscribed mass. No edema, or joint effusion   No results found for this or any previous visit (from the past 72 hour(s)).

## 2012-05-15 NOTE — Assessment & Plan Note (Addendum)
Continues to be problematic for patient.  I think this is multifactorial. Primarily associated w/ mass effect of cyst on deep structures including nerves.  Pt has not been performing exercises and will start doing so. This is to help support pts msk pain from deconditioning from decreased movement secondary to pain. Pt endorses being able to do heavy lifting but has limited her movement in part due to people telling her to do less.

## 2012-05-15 NOTE — Assessment & Plan Note (Signed)
Resolved w/ propranolol. Continue current therapy as no evidence of hypotension or other adverse outcome from bblocker Again think there is an element of psychological impact/derivation of this.

## 2012-05-16 NOTE — Assessment & Plan Note (Signed)
Pts feelings of fatigue and worsening depression likely from stopping celexa around 2 wks ago. Pt given paper script for Celexa and told to take to different pharmacy. [facility], [facility] and [facility] should cover this medicine on their $4 list.  F/u in 2 wks

## 2012-07-03 ENCOUNTER — Ambulatory Visit: Payer: Self-pay | Admitting: Family Medicine

## 2012-09-05 ENCOUNTER — Telehealth: Payer: Self-pay | Admitting: Family Medicine

## 2012-09-05 NOTE — Telephone Encounter (Signed)
Pt is needing some advice on what to do for the shakes. Plus her right leg is hurting. Pt did make an appointment with Dr. Konrad Dolores for 8/20 . JW

## 2012-09-05 NOTE — Telephone Encounter (Signed)
Pt is advised that Dr Konrad Dolores will discuss both issues w/her at her appt next wk. Pt agreed to wait.

## 2012-09-11 ENCOUNTER — Encounter: Payer: Self-pay | Admitting: Family Medicine

## 2012-09-11 ENCOUNTER — Ambulatory Visit (INDEPENDENT_AMBULATORY_CARE_PROVIDER_SITE_OTHER): Payer: Medicaid Other | Admitting: Family Medicine

## 2012-09-11 VITALS — BP 135/75 | HR 101 | Temp 98.3°F | Ht 67.0 in | Wt 138.0 lb

## 2012-09-11 DIAGNOSIS — F419 Anxiety disorder, unspecified: Secondary | ICD-10-CM

## 2012-09-11 DIAGNOSIS — R259 Unspecified abnormal involuntary movements: Secondary | ICD-10-CM

## 2012-09-11 DIAGNOSIS — G4700 Insomnia, unspecified: Secondary | ICD-10-CM

## 2012-09-11 DIAGNOSIS — F411 Generalized anxiety disorder: Secondary | ICD-10-CM

## 2012-09-11 DIAGNOSIS — D2360 Other benign neoplasm of skin of unspecified upper limb, including shoulder: Secondary | ICD-10-CM

## 2012-09-11 DIAGNOSIS — D367 Benign neoplasm of other specified sites: Secondary | ICD-10-CM

## 2012-09-11 DIAGNOSIS — R229 Localized swelling, mass and lump, unspecified: Secondary | ICD-10-CM

## 2012-09-11 DIAGNOSIS — R251 Tremor, unspecified: Secondary | ICD-10-CM

## 2012-09-11 DIAGNOSIS — IMO0001 Reserved for inherently not codable concepts without codable children: Secondary | ICD-10-CM

## 2012-09-11 DIAGNOSIS — W19XXXA Unspecified fall, initial encounter: Secondary | ICD-10-CM

## 2012-09-11 MED ORDER — HYDROXYZINE HCL 10 MG PO TABS: 10.0000 mg | ORAL_TABLET | Freq: Every evening | ORAL | Status: DC | PRN

## 2012-09-11 MED ORDER — BUTALBITAL-APAP-CAFFEINE 50-325-40 MG PO TABS: 1.0000 | ORAL_TABLET | Freq: Four times a day (QID) | ORAL | Status: DC | PRN

## 2012-09-11 MED ORDER — CITALOPRAM HYDROBROMIDE 40 MG PO TABS: 40.0000 mg | ORAL_TABLET | Freq: Every day | ORAL | Status: DC

## 2012-09-11 MED ORDER — PROPRANOLOL HCL 10 MG PO TABS: 10.0000 mg | ORAL_TABLET | Freq: Two times a day (BID) | ORAL | Status: DC

## 2012-09-11 NOTE — Progress Notes (Signed)
Rachel Bryan is a 62 y.o. female who presents to [facility] today for SD appt for shaking, falls, bump on knee  Fallen 4 times in past week. Tripping and falling in house and out in grass. Denies CP, LOC, sob, palpitations.   Bump on knee: 2 wks ago. R leg. Nonpainful. Hard. Denies fever, purulent discharge.  Shaking: off propranolol.  Depression: feeling better. Denies HI/SI. No real change after stopping celexa in May. More crying episodes per pts daughter.   The following portions of the patient's history were reviewed and updated as appropriate: allergies, current medications, past medical history, family and social history, and problem list.  Patient is a nonsmoker.  Past Medical History  Diagnosis Date  . Cluster headache   . Anxiety   . Insomnia secondary to anxiety   . Incontinence of urine     ROS as above otherwise neg.    Medications reviewed. Current Outpatient Prescriptions  Medication Sig Dispense Refill  . butalbital-acetaminophen-caffeine (FIORICET) 50-325-40 MG per tablet Take 1-2 tablets by mouth every 6 (six) hours as needed for headache.  20 tablet  0  . citalopram (CELEXA) 40 MG tablet Take 1 tablet (40 mg total) by mouth daily.  90 tablet  3  . gabapentin (NEURONTIN) 300 MG capsule Take 1 capsule (300 mg total) by mouth 3 (three) times daily.  90 capsule  0  . hydrOXYzine (ATARAX/VISTARIL) 10 MG tablet Take 1 tablet (10 mg total) by mouth at bedtime as needed for anxiety.  30 tablet  0  . propranolol (INDERAL) 10 MG tablet Take 1 tablet (10 mg total) by mouth 2 (two) times daily.  60 tablet  3   No current facility-administered medications for this visit.    Exam:  BP 135/75  Pulse 101  Temp(Src) 98.3 F (36.8 C) (Oral)  Ht 5\' 7"  (1.702 m)  Wt 138 lb (62.596 kg)  BMI 21.61 kg/m2 Gen: Well NAD HEENT: EOMI,  MMM MSK: R lateral knee well circumscribed dime sized lesion that is mobile.   No results found for this or any previous visit (from the past 72  hour(s)).

## 2012-09-11 NOTE — Patient Instructions (Addendum)
  You are doing well overall I will refill your medications Please come back to see me in 2-4 weeks Please start daily exercise adn increase your time of exercising every day.

## 2012-09-13 DIAGNOSIS — R296 Repeated falls: Secondary | ICD-10-CM | POA: Insufficient documentation

## 2012-09-13 DIAGNOSIS — IMO0001 Reserved for inherently not codable concepts without codable children: Secondary | ICD-10-CM | POA: Insufficient documentation

## 2012-09-13 DIAGNOSIS — W19XXXA Unspecified fall, initial encounter: Secondary | ICD-10-CM | POA: Insufficient documentation

## 2012-09-13 NOTE — Assessment & Plan Note (Signed)
R leg bump likely lipoma vs benign cyst.  No intervention at this time

## 2012-09-13 NOTE — Assessment & Plan Note (Signed)
Returned after stopping propranolol. restart

## 2012-09-13 NOTE — Assessment & Plan Note (Signed)
Non painful at this time

## 2012-09-13 NOTE — Assessment & Plan Note (Signed)
Not taking citalopram. Restart and f/u in 2-4 wks

## 2012-09-13 NOTE — Assessment & Plan Note (Signed)
Likely mechanical from deconditioning Pt encouraged to get out and walk daily  Also associated w/ feelings odepression.

## 2012-09-25 ENCOUNTER — Ambulatory Visit: Payer: Self-pay | Admitting: Family Medicine

## 2012-10-07 ENCOUNTER — Telehealth: Payer: Self-pay | Admitting: Family Medicine

## 2012-10-07 DIAGNOSIS — R51 Headache: Secondary | ICD-10-CM

## 2012-10-07 NOTE — Telephone Encounter (Signed)
Pt called and would like a refill on her butalbital sent to her pharmacy on file. JW

## 2012-10-09 MED ORDER — BUTALBITAL-APAP-CAFFEINE 50-325-40 MG PO TABS: 1.0000 | ORAL_TABLET | Freq: Four times a day (QID) | ORAL | Status: DC | PRN

## 2012-10-09 NOTE — Telephone Encounter (Signed)
Called in.

## 2012-10-09 NOTE — Telephone Encounter (Signed)
Message left for pt. Rachel Bryan,CMA

## 2012-10-18 ENCOUNTER — Telehealth: Payer: Self-pay | Admitting: Family Medicine

## 2012-10-18 NOTE — Telephone Encounter (Signed)
Will forward to MD. Chalyn Amescua,CMA  

## 2012-10-18 NOTE — Telephone Encounter (Signed)
Pt called to request refills on butalbital and hydroxyzine. She also wanted the doctor to know that she has been having dizzy spells. JW

## 2012-10-22 ENCOUNTER — Other Ambulatory Visit: Payer: Self-pay | Admitting: Family Medicine

## 2012-10-22 DIAGNOSIS — G4700 Insomnia, unspecified: Secondary | ICD-10-CM

## 2012-10-22 DIAGNOSIS — F419 Anxiety disorder, unspecified: Secondary | ICD-10-CM

## 2012-10-22 DIAGNOSIS — R51 Headache: Secondary | ICD-10-CM

## 2012-10-22 MED ORDER — HYDROXYZINE HCL 10 MG PO TABS: 10.0000 mg | ORAL_TABLET | Freq: Every evening | ORAL | Status: DC | PRN

## 2012-10-22 MED ORDER — BUTALBITAL-APAP-CAFFEINE 50-325-40 MG PO TABS: 1.0000 | ORAL_TABLET | Freq: Four times a day (QID) | ORAL | Status: DC | PRN

## 2012-11-26 ENCOUNTER — Ambulatory Visit: Payer: Self-pay | Admitting: Family Medicine

## 2012-11-28 ENCOUNTER — Other Ambulatory Visit: Payer: Self-pay

## 2012-11-29 ENCOUNTER — Ambulatory Visit: Payer: Self-pay | Admitting: Family Medicine

## 2012-12-11 ENCOUNTER — Ambulatory Visit: Payer: Self-pay | Admitting: Family Medicine

## 2012-12-25 ENCOUNTER — Encounter: Payer: Self-pay | Admitting: Family Medicine

## 2012-12-25 ENCOUNTER — Ambulatory Visit (INDEPENDENT_AMBULATORY_CARE_PROVIDER_SITE_OTHER): Payer: Medicaid Other | Admitting: Family Medicine

## 2012-12-25 VITALS — BP 142/82 | HR 96 | Wt 138.0 lb

## 2012-12-25 DIAGNOSIS — R32 Unspecified urinary incontinence: Secondary | ICD-10-CM | POA: Insufficient documentation

## 2012-12-25 DIAGNOSIS — R300 Dysuria: Secondary | ICD-10-CM

## 2012-12-25 DIAGNOSIS — F329 Major depressive disorder, single episode, unspecified: Secondary | ICD-10-CM

## 2012-12-25 DIAGNOSIS — R197 Diarrhea, unspecified: Secondary | ICD-10-CM

## 2012-12-25 DIAGNOSIS — W19XXXD Unspecified fall, subsequent encounter: Secondary | ICD-10-CM

## 2012-12-25 DIAGNOSIS — W19XXXA Unspecified fall, initial encounter: Secondary | ICD-10-CM

## 2012-12-25 DIAGNOSIS — R51 Headache: Secondary | ICD-10-CM

## 2012-12-25 DIAGNOSIS — G4700 Insomnia, unspecified: Secondary | ICD-10-CM

## 2012-12-25 LAB — POCT UA - MICROSCOPIC ONLY

## 2012-12-25 LAB — POCT URINALYSIS DIPSTICK
Glucose, UA: NEGATIVE
Ketones, UA: NEGATIVE
Spec Grav, UA: <=1.005
Urobilinogen, UA: 0.2

## 2012-12-25 MED ORDER — OMEPRAZOLE 40 MG PO CPDR: 40.0000 mg | DELAYED_RELEASE_CAPSULE | Freq: Every day | ORAL | Status: DC

## 2012-12-25 NOTE — Assessment & Plan Note (Signed)
Likely mostly stress related Encouraged again to start good sleep hygiene  Cont hydrox 10mg  PRN. Will not give routine med as pt w/ difficulty obtianing and taking meds regularly

## 2012-12-25 NOTE — Progress Notes (Signed)
Rachel Bryan is a 62 y.o. female who presents to [facility] today for f/u  Hip pain and falls: legs week. Will walk multiple times per day up to 1-2 miles. HIps in pain. Present for 1 week. Comes and goes in episodes. R hip pain on lateral side.   Diarrhea: started 7 days ago. No sick contacts. Loose, non-bloody. 4x daily. Upset stomach from time to time. Denies fevers, n/v. Very very stressed.   Depression: ran out of money and stopped taking celexa and atarax 4 mo ago. Depression got significantly worse over past few weeks but now getting better. Denies HI/SI.   HA: ongiong. fioricet helps. Lots of caffeine use. Present for years. Very stressed. Had a time of 48 hours w/o sleep due to friend at hospital which made HA worse.   Tobacco: cut down to 1/2ppd  Insomnia: taking hydroxyzine 20mg  w/o relief. Trazadone tried before w/o relief.   Urinary frequency: 2 wks. Dysuria. Wearing depends at day and night. Occurs w/ cough and sneezing. Urge incontinence.   The following portions of the patient's history were reviewed and updated as appropriate: allergies, current medications, past medical history, family and social history, and problem list.  Patient is a smoker  Past Medical History  Diagnosis Date  . Cluster headache   . Anxiety   . Insomnia secondary to anxiety   . Incontinence of urine     ROS as above otherwise neg.    Medications reviewed. Current Outpatient Prescriptions  Medication Sig Dispense Refill  . butalbital-acetaminophen-caffeine (FIORICET) 50-325-40 MG per tablet Take 1-2 tablets by mouth every 6 (six) hours as needed for headache.  20 tablet  1  . citalopram (CELEXA) 40 MG tablet Take 1 tablet (40 mg total) by mouth daily.  90 tablet  3  . gabapentin (NEURONTIN) 300 MG capsule Take 1 capsule (300 mg total) by mouth 3 (three) times daily.  90 capsule  0  . hydrOXYzine (ATARAX/VISTARIL) 10 MG tablet Take 1 tablet (10 mg total) by mouth at bedtime as needed for anxiety.  30  tablet  1  . propranolol (INDERAL) 10 MG tablet Take 1 tablet (10 mg total) by mouth 2 (two) times daily.  60 tablet  3   No current facility-administered medications for this visit.    Exam:  BP 142/82  Pulse 96  Wt 138 lb (62.596 kg) Gen: Well NAD HEENT: EOMI,  MMM  MSK: hip ROM nml, pain on palpation of R greater trochanter, hip strength w/ flexion, extension, abduction, and adduction 5/5 bilat.   No results found for this or any previous visit (from the past 72 hour(s)).  >50min in direct pt care

## 2012-12-25 NOTE — Assessment & Plan Note (Addendum)
Mechanical falls No signs of arthritis R hip bursitis Conservative mgt at this time w/ NSAIDs Consider injection in 1 month if not better Exercises given for abductor and adductor strengthening Cont using cane

## 2012-12-25 NOTE — Addendum Note (Signed)
Addended by: [place], Jeniffer on: 12/25/2012 06:00 PM   Modules accepted: Orders

## 2012-12-25 NOTE — Assessment & Plan Note (Signed)
Likely stress related vs resolving viral gastro w/ possible reflux as well immodium  Prilosec x 14 days

## 2012-12-25 NOTE — Assessment & Plan Note (Signed)
Primarily stress related as well as med withdrawal/overuse (caffeine), and from lack of sleep HA journal NSAIDs Tylenol PRN

## 2012-12-25 NOTE — Assessment & Plan Note (Addendum)
Urge and stress incontinence  H/o 4 vaginal deliveries Possible UTI as w/ frequency and dysuria UA today And wait and watch as may need further intervention Kegel exercises vs antispasmodic  Initial UA report unremarkable  Start Kegel exercises

## 2012-12-25 NOTE — Patient Instructions (Addendum)
You are doing great, no matter how things may appear. Please keep a positive attitude, and look to the future as things improve Please make sure to ttka time for yourself as many of your symptoms are coming from stress. Keep a headache and sleep journal Please start the imodium and prilosec for your stomach pain and diarrhea I will let you know if you have a bladder infection. I your symptoms do not resolve we may need to start you on exercises or another medicine to help [place] job cutting down your tobacco use Please come back in 1 month If you are still having hip pain we may consider doing an injection at that time Please start using ibuprofen 600mg  every 6 hours  Kegel Exercises The goal of Kegel exercises is to isolate and exercise your pelvic floor muscles. These muscles act as a hammock that supports the rectum, vagina, small intestine, and uterus. As the muscles weaken, the hammock sags and these organs are displaced from their normal positions. Kegel exercises can strengthen your pelvic floor muscles and help you to improve bladder and bowel control, improve sexual response, and help reduce many problems and some discomfort during pregnancy. Kegel exercises can be done anywhere and at any time. HOW TO PERFORM KEGEL EXERCISES 1. Locate your pelvic floor muscles. To do this, squeeze (contract) the muscles that you use when you try to stop the flow of urine. You will feel a tightness in the vaginal area (women) and a tight lift in the rectal area (men and women). 2. When you begin, contract your pelvic muscles tight for 2 5 seconds, then relax them for 2 5 seconds. This is one set. Do 4 5 sets with a short pause in between. 3. Contract your pelvic muscles for 8 10 seconds, then relax them for 8 10 seconds. Do 4 5 sets. If you cannot contract your pelvic muscles for 8 10 seconds, try 5 7 seconds and work your way up to 8 10 seconds. Your goal is 4 5 sets of 10 contractions each day. Keep your  stomach, buttocks, and legs relaxed during the exercises. Perform sets of both short and long contractions. Vary your positions. Perform these contractions 3 4 times per day. Perform sets while you are:   Lying in bed in the morning.  Standing at lunch.  Sitting in the late afternoon.  Lying in bed at night. You should do 40 50 contractions per day. Do not perform more Kegel exercises per day than recommended. Overexercising can cause muscle fatigue. Continue these exercises for for at least 15 20 weeks or as directed by your caregiver. Document Released: 12/27/2011 Document Reviewed: 12/27/2011 [facility] Patient Information 2014 [place], [place].

## 2012-12-25 NOTE — Assessment & Plan Note (Signed)
No further antidepressents until pt can reliably take meds w/o stopping as had stopped cold [place] and became very depressed Spent >15 min counseling about home situation and stressors Pt to seek to take time to self  No HI/SI Pt actually very strong emotionally and able to handle a lot of pressure

## 2013-02-10 ENCOUNTER — Ambulatory Visit: Payer: Self-pay | Admitting: Family Medicine

## 2013-03-07 ENCOUNTER — Telehealth: Payer: Self-pay | Admitting: Family Medicine

## 2013-03-07 NOTE — Telephone Encounter (Signed)
Pt called and needs something called in for her headache that won't go away and also something for sleep.jw

## 2013-03-12 MED ORDER — TRAZODONE HCL 50 MG PO TABS: 25.0000 mg | ORAL_TABLET | Freq: Every evening | ORAL | Status: DC | PRN

## 2013-03-12 NOTE — Telephone Encounter (Signed)
Pt w/ HA as is her typical Cut out caffeine from last appt  Trying ibuprofen 600mg  w/o relief Ongoing for 4 days.  Recommending appt. And to take Excedrin  Linna Darner, MD Family Medicine PGY-3 03/12/2013, 4:19 PM

## 2013-04-04 ENCOUNTER — Ambulatory Visit: Payer: Self-pay | Admitting: Family Medicine

## 2013-04-16 ENCOUNTER — Telehealth: Payer: Self-pay | Admitting: Family Medicine

## 2013-04-16 NOTE — Telephone Encounter (Signed)
Pt is having chest palpations.

## 2013-04-17 ENCOUNTER — Ambulatory Visit: Payer: Self-pay

## 2013-05-21 ENCOUNTER — Ambulatory Visit: Payer: Self-pay | Admitting: Family Medicine

## 2013-05-22 ENCOUNTER — Ambulatory Visit: Payer: Self-pay | Admitting: Family Medicine

## 2013-05-23 ENCOUNTER — Ambulatory Visit: Payer: Self-pay | Admitting: Family Medicine

## 2013-05-28 ENCOUNTER — Ambulatory Visit: Payer: Self-pay | Admitting: Family Medicine

## 2013-06-09 ENCOUNTER — Ambulatory Visit: Payer: Self-pay | Admitting: Family Medicine

## 2013-06-30 ENCOUNTER — Other Ambulatory Visit: Payer: Self-pay | Admitting: *Deleted

## 2013-06-30 DIAGNOSIS — G4700 Insomnia, unspecified: Secondary | ICD-10-CM

## 2013-07-04 MED ORDER — HYDROXYZINE HCL 10 MG PO TABS: 10.0000 mg | ORAL_TABLET | Freq: Every evening | ORAL | Status: DC | PRN

## 2013-07-14 ENCOUNTER — Other Ambulatory Visit: Payer: Self-pay | Admitting: *Deleted

## 2013-07-14 DIAGNOSIS — G4700 Insomnia, unspecified: Secondary | ICD-10-CM

## 2013-07-14 MED ORDER — HYDROXYZINE HCL 10 MG PO TABS: 10.0000 mg | ORAL_TABLET | Freq: Every evening | ORAL | Status: DC | PRN

## 2013-07-16 ENCOUNTER — Ambulatory Visit: Payer: Self-pay | Admitting: Family Medicine

## 2013-07-31 ENCOUNTER — Ambulatory Visit (HOSPITAL_COMMUNITY)
Admission: RE | Admit: 2013-07-31 | Discharge: 2013-07-31 | Disposition: A | Discharge status: Home / Self Care | Payer: Medicaid Other | Source: Ambulatory Visit | Attending: Family Medicine | Admitting: Family Medicine

## 2013-07-31 ENCOUNTER — Ambulatory Visit (INDEPENDENT_AMBULATORY_CARE_PROVIDER_SITE_OTHER): Payer: Medicaid Other | Admitting: Family Medicine

## 2013-07-31 VITALS — BP 133/66 | HR 98 | Temp 98.2°F | Ht 66.0 in | Wt 137.0 lb

## 2013-07-31 DIAGNOSIS — M79609 Pain in unspecified limb: Secondary | ICD-10-CM

## 2013-07-31 DIAGNOSIS — R079 Chest pain, unspecified: Secondary | ICD-10-CM | POA: Diagnosis present

## 2013-07-31 DIAGNOSIS — M79661 Pain in right lower leg: Secondary | ICD-10-CM | POA: Insufficient documentation

## 2013-07-31 DIAGNOSIS — G4700 Insomnia, unspecified: Secondary | ICD-10-CM

## 2013-07-31 DIAGNOSIS — R51 Headache: Secondary | ICD-10-CM

## 2013-07-31 LAB — C-REACTIVE PROTEIN: CRP: <0.5 mg/dL (ref <0.60)

## 2013-07-31 MED ORDER — AMITRIPTYLINE HCL 25 MG PO TABS: 25.0000 mg | ORAL_TABLET | Freq: Every day | ORAL | Status: DC

## 2013-07-31 NOTE — Patient Instructions (Signed)
Rachel Bryan, it was a pleasure seeing you today. Today we talked about your headache, leg cramps and sleep. This may all be related to stress but I am going to run some tests. In the mean time, I am prescribing you a medication that you can take at night time.  Please schedule an appointment to see me in 4 weeks for a complete physical exam. Be sure to not eat anything and make a morning appointment.  If you have any questions or concerns, please do not hesitate to call the office at [phone].  Sincerely,  Cordelia Poche, MD

## 2013-07-31 NOTE — Assessment & Plan Note (Signed)
Tension headache vs possible temporal arteritis. Will obtain ESR, CRP and ANA. Will treat with amitriptyline in the mean time

## 2013-07-31 NOTE — Progress Notes (Signed)
   Subjective:    Patient ID: Rachel Bryan, female    DOB: [birth year], 63 y.o.   MRN: [id number]  HPI  Patient with multiple complaints. Main complaints are: leg cramping, headache and sleep. Her leg cramps have been present for over a year. They are bad whether she is sleeping or when she is up stationary or walking. Pain does not radiate and is tender. No swelling noted. Her headache has been presents for several weeks. It is located in the right temporal region and does not radiate. Her head is tender. She has tried ibuprofen, tylenol and Excedrin which haven't helped. Goody's powder has helped. Regarding her sleep, she goes to bed around 11pm and does not fall asleep until 4am. She has a TV in the bedroom and a light. She has tried trazadone in the past but that has nothelped.   Review of Systems  Eyes: Negative for photophobia, pain, redness and visual disturbance.  Respiratory: Positive for chest tightness (left sided, usually lasts one hour and improves spontaneously. Non-exertional) and shortness of breath.   Gastrointestinal: Negative for nausea and vomiting.  Neurological: Positive for headaches. Negative for syncope, facial asymmetry and light-headedness.       Objective:   Physical Exam  Vitals reviewed. Constitutional: She is oriented to person, place, and time. She appears well-developed and well-nourished.  HENT:  Right temporal tenderness  Eyes: Conjunctivae and EOM are normal. Pupils are equal, round, and reactive to light.  Cardiovascular: Normal rate, regular rhythm and normal heart sounds.   No murmur heard. Pulmonary/Chest: Breath sounds normal. No respiratory distress. She has no wheezes. She has no rales. She exhibits no tenderness.  Musculoskeletal: Normal range of motion. She exhibits tenderness (Right calf). She exhibits no edema.  Neurological: She is alert and oriented to person, place, and time.  Skin: Skin is warm and dry.          Assessment & Plan:

## 2013-07-31 NOTE — Assessment & Plan Note (Signed)
Most likely related to significant stress. Will prescribe amitriptyline 25mg  qHS for sleep

## 2013-08-01 LAB — SEDIMENTATION RATE: Sed Rate: 13 mm/hr (ref 0–22)

## 2013-08-01 LAB — ANA: ANA: NEGATIVE

## 2013-08-28 ENCOUNTER — Encounter: Payer: Self-pay | Admitting: Family Medicine

## 2013-08-28 ENCOUNTER — Ambulatory Visit (INDEPENDENT_AMBULATORY_CARE_PROVIDER_SITE_OTHER): Payer: Medicaid Other | Admitting: Family Medicine

## 2013-08-28 ENCOUNTER — Observation Stay (HOSPITAL_COMMUNITY): Payer: Medicaid Other

## 2013-08-28 ENCOUNTER — Telehealth: Payer: Self-pay | Admitting: *Deleted

## 2013-08-28 ENCOUNTER — Encounter (HOSPITAL_COMMUNITY): Payer: Self-pay | Admitting: *Deleted

## 2013-08-28 ENCOUNTER — Observation Stay (HOSPITAL_COMMUNITY)
Admission: AD | Admit: 2013-08-28 | Discharge: 2013-08-29 | Disposition: A | Discharge status: Home / Self Care | Payer: Medicaid Other | Source: Ambulatory Visit | Attending: Family Medicine | Admitting: Family Medicine

## 2013-08-28 VITALS — BP 135/88 | HR 98 | Temp 98.8°F

## 2013-08-28 DIAGNOSIS — R519 Headache, unspecified: Secondary | ICD-10-CM | POA: Diagnosis present

## 2013-08-28 DIAGNOSIS — N3946 Mixed incontinence: Secondary | ICD-10-CM | POA: Diagnosis not present

## 2013-08-28 DIAGNOSIS — Z79899 Other long term (current) drug therapy: Secondary | ICD-10-CM | POA: Insufficient documentation

## 2013-08-28 DIAGNOSIS — E440 Moderate protein-calorie malnutrition: Secondary | ICD-10-CM

## 2013-08-28 DIAGNOSIS — G43109 Migraine with aura, not intractable, without status migrainosus: Principal | ICD-10-CM | POA: Diagnosis present

## 2013-08-28 DIAGNOSIS — R269 Unspecified abnormalities of gait and mobility: Secondary | ICD-10-CM | POA: Diagnosis present

## 2013-08-28 DIAGNOSIS — R2681 Unsteadiness on feet: Secondary | ICD-10-CM

## 2013-08-28 DIAGNOSIS — F3289 Other specified depressive episodes: Secondary | ICD-10-CM | POA: Insufficient documentation

## 2013-08-28 DIAGNOSIS — F17200 Nicotine dependence, unspecified, uncomplicated: Secondary | ICD-10-CM | POA: Diagnosis not present

## 2013-08-28 DIAGNOSIS — F411 Generalized anxiety disorder: Secondary | ICD-10-CM | POA: Insufficient documentation

## 2013-08-28 DIAGNOSIS — F329 Major depressive disorder, single episode, unspecified: Secondary | ICD-10-CM | POA: Diagnosis not present

## 2013-08-28 DIAGNOSIS — R279 Unspecified lack of coordination: Secondary | ICD-10-CM | POA: Insufficient documentation

## 2013-08-28 DIAGNOSIS — R32 Unspecified urinary incontinence: Secondary | ICD-10-CM | POA: Diagnosis present

## 2013-08-28 DIAGNOSIS — R51 Headache: Secondary | ICD-10-CM

## 2013-08-28 DIAGNOSIS — Z23 Encounter for immunization: Secondary | ICD-10-CM | POA: Insufficient documentation

## 2013-08-28 DIAGNOSIS — G4700 Insomnia, unspecified: Secondary | ICD-10-CM | POA: Diagnosis not present

## 2013-08-28 DIAGNOSIS — R630 Anorexia: Secondary | ICD-10-CM | POA: Diagnosis present

## 2013-08-28 LAB — CBC WITH DIFFERENTIAL/PLATELET
Basophils Absolute: 0.1 10*3/uL (ref 0.0–0.1)
Basophils Relative: 1 % (ref 0–1)
Eosinophils Absolute: 0.4 10*3/uL (ref 0.0–0.7)
Eosinophils Relative: 5 % (ref 0–5)
HCT: 39.5 % (ref 36.0–46.0)
Hemoglobin: 12.8 g/dL (ref 12.0–15.0)
LYMPHS ABS: 2.6 10*3/uL (ref 0.7–4.0)
Lymphocytes Relative: 37 % (ref 12–46)
MCH: 29.3 pg (ref 26.0–34.0)
MCHC: 32.4 g/dL (ref 30.0–36.0)
MCV: 90.4 fL (ref 78.0–100.0)
Monocytes Absolute: 0.5 10*3/uL (ref 0.1–1.0)
Monocytes Relative: 8 % (ref 3–12)
Neutro Abs: 3.6 10*3/uL (ref 1.7–7.7)
Neutrophils Relative %: 49 % (ref 43–77)
Platelets: 279 10*3/uL (ref 150–400)
RBC: 4.37 MIL/uL (ref 3.87–5.11)
RDW: 13 % (ref 11.5–15.5)
WBC: 7.2 10*3/uL (ref 4.0–10.5)

## 2013-08-28 LAB — COMPREHENSIVE METABOLIC PANEL
ALT: 8 U/L (ref 0–35)
ANION GAP: 10 (ref 5–15)
AST: 13 U/L (ref 0–37)
Albumin: 3.7 g/dL (ref 3.5–5.2)
Alkaline Phosphatase: 71 U/L (ref 39–117)
BILIRUBIN TOTAL: 0.2 mg/dL — AB (ref 0.3–1.2)
BUN: 8 mg/dL (ref 6–23)
CO2: 27 mEq/L (ref 19–32)
CREATININE: 0.69 mg/dL (ref 0.50–1.10)
Calcium: 9.5 mg/dL (ref 8.4–10.5)
Chloride: 107 mEq/L (ref 96–112)
GFR calc Af Amer: >90 mL/min (ref >90)
GFR calc non Af Amer: >90 mL/min (ref >90)
Glucose, Bld: 89 mg/dL (ref 70–99)
Potassium: 4.1 mEq/L (ref 3.7–5.3)
Sodium: 144 mEq/L (ref 137–147)
TOTAL PROTEIN: 7 g/dL (ref 6.0–8.3)

## 2013-08-28 LAB — SEDIMENTATION RATE: Sed Rate: 28 mm/hr — ABNORMAL HIGH (ref 0–22)

## 2013-08-28 LAB — VITAMIN B12: VITAMIN B 12: 286 pg/mL (ref 211–911)

## 2013-08-28 LAB — C-REACTIVE PROTEIN: CRP: <0.5 mg/dL — ABNORMAL LOW (ref <0.60)

## 2013-08-28 LAB — GLUCOSE, CAPILLARY: GLUCOSE-CAPILLARY: 89 mg/dL (ref 70–99)

## 2013-08-28 LAB — PHOSPHORUS: Phosphorus: 3.1 mg/dL (ref 2.3–4.6)

## 2013-08-28 LAB — TSH: TSH: 1.97 u[IU]/mL (ref 0.350–4.500)

## 2013-08-28 LAB — MAGNESIUM: MAGNESIUM: 2 mg/dL (ref 1.5–2.5)

## 2013-08-28 MED ORDER — DIPHENHYDRAMINE HCL 25 MG PO CAPS: 25.0000 mg | ORAL_CAPSULE | Freq: Four times a day (QID) | ORAL | Status: DC | PRN

## 2013-08-28 MED ORDER — PROCHLORPERAZINE MALEATE 10 MG PO TABS
10.0000 mg | ORAL_TABLET | Freq: Four times a day (QID) | ORAL | Status: DC | PRN
  Filled 2013-08-28: qty 1

## 2013-08-28 MED ORDER — HEPARIN SODIUM (PORCINE) 5000 UNIT/ML IJ SOLN: 5000.0000 [IU] | Freq: Three times a day (TID) | INTRAMUSCULAR | Status: DC

## 2013-08-28 MED ORDER — PANTOPRAZOLE SODIUM 40 MG PO TBEC
80.0000 mg | DELAYED_RELEASE_TABLET | Freq: Every day | ORAL | Status: DC
  Administered 2013-08-28: 80 mg via ORAL
  Filled 2013-08-28: qty 2

## 2013-08-28 MED ORDER — SODIUM CHLORIDE 0.45 % IV SOLN: INTRAVENOUS | Status: DC

## 2013-08-28 MED ORDER — PNEUMOCOCCAL VAC POLYVALENT 25 MCG/0.5ML IJ INJ
0.5000 mL | INJECTION | INTRAMUSCULAR | Status: AC
  Administered 2013-08-29: 0.5 mL via INTRAMUSCULAR
  Filled 2013-08-28 (×2): qty 0.5

## 2013-08-28 MED ORDER — KETOROLAC TROMETHAMINE 30 MG/ML IJ SOLN
30.0000 mg | Freq: Four times a day (QID) | INTRAMUSCULAR | Status: DC
  Administered 2013-08-28: 30 mg via INTRAVENOUS
  Filled 2013-08-28: qty 1

## 2013-08-28 MED ORDER — AMITRIPTYLINE HCL 25 MG PO TABS
25.0000 mg | ORAL_TABLET | Freq: Every day | ORAL | Status: DC
  Administered 2013-08-28: 25 mg via ORAL
  Filled 2013-08-28: qty 1

## 2013-08-28 MED ORDER — GADOBENATE DIMEGLUMINE 529 MG/ML IV SOLN
13.0000 mL | Freq: Once | INTRAVENOUS | Status: AC | PRN
  Administered 2013-08-28: 13 mL via INTRAVENOUS

## 2013-08-28 MED ORDER — PROCHLORPERAZINE EDISYLATE 5 MG/ML IJ SOLN
10.0000 mg | Freq: Four times a day (QID) | INTRAMUSCULAR | Status: DC | PRN
  Filled 2013-08-28: qty 2

## 2013-08-28 MED ORDER — IBUPROFEN 600 MG PO TABS
600.0000 mg | ORAL_TABLET | Freq: Four times a day (QID) | ORAL | Status: DC | PRN
  Administered 2013-08-28: 600 mg via ORAL
  Filled 2013-08-28: qty 1

## 2013-08-28 MED ORDER — DIPHENHYDRAMINE HCL 50 MG/ML IJ SOLN: 12.5000 mg | Freq: Four times a day (QID) | INTRAMUSCULAR | Status: DC | PRN

## 2013-08-28 NOTE — Telephone Encounter (Signed)
Message copied by Johny Shears on Thu Aug 28, 2013  2:39 PM ------      Message from: Cordelia Poche A      Created: Thu Aug 28, 2013 12:55 PM      Regarding: Cancel MRI appointment       Patient has an appoint scheduled for MRI. Patient was instead admitted and needs MRI appointment canceled. Thanks! ------

## 2013-08-28 NOTE — Assessment & Plan Note (Signed)
Concern for normal pressure hydrocephalus. Possible that she could have cerebellar stroke, although on physical exam, no evidence other than ataxia. Initially was going to work patient up outpatient with MRI and lab work, but after examining her gait, did not feel it was safe for patient to go home. Will admit patient.

## 2013-08-28 NOTE — Progress Notes (Signed)
Pt arrived in 4N22. Pt stable.

## 2013-08-28 NOTE — Telephone Encounter (Signed)
Appointment has been cancelled.

## 2013-08-28 NOTE — Progress Notes (Signed)
Chaplain responded to spiritual care consult to help pt with advance directive. I brought AD packet to pt. Pt's fiance and several family members were present. At pt's request I explained the AD forms with everyone listening. Pt was clear about her wishes. I left forms for them to discuss further. Pt will have RN call Spiritual Care tomorrow after pt has completed the form. Some family members were tearful out of concern for pt's medical condition.

## 2013-08-28 NOTE — Consult Note (Signed)
Neurology Consultation Reason for Consult: Gait Abnormality Referring Physician: Su Ley  CC: gait difficulty  History is obtained from:patient  HPI: Rachel Bryan is a 63 y.o. female with a history of right leg difficulty for several months who presents with difficulty walking that started yesterday morning on awakening. She states that initially she was able to walk, but by 10:30 AM it progressed to the point that she was unable to. She continued to have worsening and therefore decided to seek treatment with her PCP today. When he evaluated her, he noted that she could not walk and had right-sided weakness and therefore referred her for further evaluation to the emergency room.  She also complains of headaches that been progressive over the past 6 months, causing her to lay down in a  at times, but not really associated with photophobia or n/v.  She denies n/v or vertigo currently.    LKW: 8/4 prior to bed.  tpa given?: no, out of window.     ROS: A 14 point ROS was performed and is negative except as noted in the HPI.   Past Medical History  Diagnosis Date  . Cluster headache   . Anxiety   . Insomnia secondary to anxiety   . Incontinence of urine     Family History: Stroke  Social History: Tob: current smoker  Exam: Current vital signs: Wt 62 kg (136 lb 11 oz) Vital signs in last 24 hours: Temp:  [98.8 F (37.1 C)] 98.8 F (37.1 C) (08/06 1100) Pulse Rate:  [98] 98 (08/06 1100) BP: (135)/(88) 135/88 mmHg (08/06 1100) Weight:  [62 kg (136 lb 11 oz)] 62 kg (136 lb 11 oz) (08/06 1300)  General: in bed, NAD CV: RRR Mental Status: Patient is awake, alert, oriented to person, place, month, year, and situation. Immediate and remote memory are intact. Patient is able to give a clear and coherent history. No signs of aphasia or neglect Cranial Nerves: II: Visual Fields are notable for a right hemianopia. Pupils are equal, round, and reactive to light.  Discs are  difficult to visualize. III,IV, VI: EOMI without ptosis or diploplia.  V: Facial sensation is symmetric to temperature VII: Facial movement is symmetric.  VIII: hearing is intact to voice X: Uvula elevates symmetrically XI: Shoulder shrug is symmetric. XII: tongue is midline without atrophy or fasciculations.  Motor: Tone is normal. Bulk is normal. 5/5 strength was present on the left side, she has 4/5 weakness throughout the right upper extremity, and 3/5 weakness in the right lower extremity. Sensory: Sensation is diminished in her right leg Deep Tendon Reflexes: 2+ and symmetric in the biceps and absent at the patella and Achilles bilaterally Cerebellar: FNF with dysmetria bilaterally. Gait: Not tested due to weakness   I have reviewed labs in epic and the results pertinent to this consultation are: She had a recent ESR, CRP, ANA done due to headaches.   Impression: 63 yo F with right sided vision loss, right sided weakness, and bilateral dysmetria. Posterior circulation infarct would be one possibility, but especially with the previous symptoms, I would be concerned about a mass lesion. If the hemianopia were old, then some GBS variant could be considered given decreased reflexes, but this would not explain her entire exam alone.   Recommendations: 1) MRI brain w/wo contrast.  2) ESR, CRP 3) Further recs following imaging.    Roland Rack, MD [facility] [phone]  If 7pm- 7am, please page neurology on call as listed in [place].
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
## 2013-08-28 NOTE — H&P (Signed)
FMTS Attending Note  I personally saw and evaluated the patient with Dr. Lonny Prude at 1200. The plan of care was discussed with the resident team. I agree with the assessment and plan as documented by the resident.   63 y/o female seen in office for acute worsening of gait, she has PMH of anxiety, cluster HA's, urinary incontinence for the past 6 months, and worsening memory over the past 6 months. She reports one year history of right upper and right lower extremity weakness, no acute changes in arm weakness over the past 24 hours, no associated dizziness/lightheadedness/syncope/chest pain. Please refer to resident note for additional HPI.  Vitals: Reviewed Gen: pleasant Caucasian female, accompanied by fiance HEENT: normocephalic, PERRL, EOMI, no scleral icterus, nasal septum midline, MMM, uvula midline, neck supple, no anterior or posterior cervical lymphadenopathy Cardiac: RRR, S1 and S2 present, no murmurs, no heaves/thrills Resp: CTAB, normal effort Ext: no edema Neuro: A&O x3, CN2-12 intact, strength RUE 4/5, RLE 3/5, LUE/LLE 5/5, no gross sensation deficits to light touch, absent patellar reflexes, 2+ biceps reflexes bilaterally, wide based gait (required 1-2 person assist to ambulate)  Assessment and plan: 63 y/o female presents with abnormal gait. 1. Abnormal gait/Ataxia - as patient also has worsening memory and history of urinary incontinence she will be directly admitted to rule out NPH. Cranial mass/CVA are also in the differential diagnosis. Check lab work including BMP/CBC/B12/Folate/TSH/HIV/RPR. Agree with MRI brain and Neurology consult. 2. Mixed urinary incontinence - unclear if related to above problem, will need follow up as outpatient if neurologic workup is negative 3. Anxiety/Depression/Insomnia - stable, continue home medications.   Dossie Arbour MD

## 2013-08-28 NOTE — Progress Notes (Signed)
   Subjective:    Patient ID: Rachel Bryan, female    DOB: [birth year], 63 y.o.   MRN: [id number]  HPI  Patient presents today with gait instability. Symptoms started yesterday evening and worsened this morning. She initially thought her sugar was low. She has no other new neurological deficits, including no facial droop, no decreased strength from baseline and no sensation decrease. She has headaches, which she's been having intermittently for months. No nausea or vomiting. She has some issues with memory, including forgetting where she places her money and placing items from fridge (ie. Milk) in places like closets because she cannot remember from where she got them. She has urinary incontinence which has been a problem for at least 6 months.   Review of Systems  Constitutional: Positive for unexpected weight change. Negative for fever.  Musculoskeletal: Positive for gait problem. Negative for neck stiffness.  Neurological: Positive for headaches. Negative for dizziness, seizures, syncope, speech difficulty, light-headedness and numbness.       Objective:   Physical Exam  Constitutional: She is oriented to person, place, and time. Vital signs are normal. She appears well-developed and well-nourished.  HENT:  Mouth/Throat: Uvula is midline and oropharynx is clear and moist.  Eyes: EOM are normal. Pupils are equal, round, and reactive to light.  Neck: No mass and no thyromegaly present.  Cardiovascular: Normal rate and regular rhythm.   Pulmonary/Chest: Effort normal and breath sounds normal.  Neurological: She is alert and oriented to person, place, and time. She has normal reflexes. No cranial nerve deficit or sensory deficit. Gait abnormal.  Finger to nose intact. Did not perform romberg due to overall instability Right sided strength 4/5 UE and 3/5 LE.           Assessment & Plan:

## 2013-08-29 ENCOUNTER — Ambulatory Visit (HOSPITAL_COMMUNITY): Payer: Medicaid Other

## 2013-08-29 ENCOUNTER — Other Ambulatory Visit: Payer: Medicaid Other

## 2013-08-29 DIAGNOSIS — R51 Headache: Secondary | ICD-10-CM

## 2013-08-29 DIAGNOSIS — G43109 Migraine with aura, not intractable, without status migrainosus: Secondary | ICD-10-CM | POA: Diagnosis not present

## 2013-08-29 DIAGNOSIS — G4700 Insomnia, unspecified: Secondary | ICD-10-CM

## 2013-08-29 DIAGNOSIS — N3946 Mixed incontinence: Secondary | ICD-10-CM

## 2013-08-29 DIAGNOSIS — E440 Moderate protein-calorie malnutrition: Secondary | ICD-10-CM

## 2013-08-29 DIAGNOSIS — R630 Anorexia: Secondary | ICD-10-CM | POA: Diagnosis present

## 2013-08-29 LAB — HIV ANTIBODY (ROUTINE TESTING W REFLEX): HIV: NONREACTIVE

## 2013-08-29 LAB — FOLATE RBC: RBC Folate: 675 ng/mL — ABNORMAL HIGH (ref >280)

## 2013-08-29 LAB — RPR

## 2013-08-29 MED ORDER — PANTOPRAZOLE SODIUM 40 MG PO TBEC
40.0000 mg | DELAYED_RELEASE_TABLET | Freq: Every day | ORAL | Status: DC
  Administered 2013-08-29: 40 mg via ORAL
  Filled 2013-08-29: qty 1

## 2013-08-29 MED ORDER — BOOST PLUS PO LIQD: 237.0000 mL | Freq: Two times a day (BID) | ORAL | Status: DC

## 2013-08-29 MED ORDER — HEPARIN SODIUM (PORCINE) 5000 UNIT/ML IJ SOLN: 5000.0000 [IU] | Freq: Three times a day (TID) | INTRAMUSCULAR | Status: DC

## 2013-08-29 MED ORDER — TOPIRAMATE 25 MG PO TABS
25.0000 mg | ORAL_TABLET | Freq: Every day | ORAL | Status: DC
  Administered 2013-08-29: 25 mg via ORAL
  Filled 2013-08-29: qty 1

## 2013-08-29 MED ORDER — PROCHLORPERAZINE MALEATE 10 MG PO TABS: 10.0000 mg | ORAL_TABLET | Freq: Four times a day (QID) | ORAL | Status: DC | PRN

## 2013-08-29 MED ORDER — TOPIRAMATE 25 MG PO TABS: 25.0000 mg | ORAL_TABLET | Freq: Every day | ORAL | Status: DC

## 2013-08-29 MED ORDER — BOOST PLUS PO LIQD
237.0000 mL | Freq: Two times a day (BID) | ORAL | Status: DC
  Filled 2013-08-29 (×2): qty 237

## 2013-08-29 NOTE — Progress Notes (Signed)
Received referral for financial concerns. CM talked to patient with spouse present about " financial concerns," patient looked at me and stated " I do not have any financial concerns, I have Medicaid." [place] called for DMEAneta Mins [phone]

## 2013-08-29 NOTE — Discharge Instructions (Signed)
You were admitted due to your balance / walking issues. The neurologist (Dr. Leonel Ramsay) believes you are having migraines causing your symptoms. He recommends starting a medicine called Topamax, which will help prevent migraines. You should take this medicine, 25 mg once a day for now. Dr. Lonny Prude will help you adjust this upwards. You should STOP taking your amitriptyline if it is not helping your headaches. You can take Benadryl 25 mg and / or Compazine (prochlorperazine) 10 mg every 6 hours to help stop a migraine when you get one. You should see Dr. Lonny Prude on Monday 8/10 at 10:15 AM. He will help you adjust your medications, going forward. He will put in a referral for you to see the neurologists as an outpatient.

## 2013-08-29 NOTE — Progress Notes (Signed)
FMTS ATTENDING  NOTE Rachel Jaspers,MD I  have seen and examined this patient, reviewed their chart. I have discussed this patient with the resident. I agree with the resident's findings, assessment and care plan. Patient feels better today however her gait is still not balanced but at baseline. MRI brain negative with no intracranial pathology at this time. Patient had been assessed neurologist who feels this is related to severe migraine, recommending outpatient neuro follow up. I recommended walker for stabilization and PT. She might benefit from home PT or outpatient PT referral if covered by her insurance,her PCP can schedule this for her. Plan to consider spinal imaging if symptoms persist.

## 2013-08-29 NOTE — Progress Notes (Signed)
INITIAL NUTRITION ASSESSMENT  DOCUMENTATION CODES Per approved criteria  -Non-severe (moderate) malnutrition in the context of acute illness or injury  Pt meets criteria for MODERATE MALNUTRITION in the context of ACUTE ILLNESS as evidenced by mild muscle wasting, mild fat wasting, and estimated energy intake <75% of estimated energy needs for > 1 month.   INTERVENTION: Provide Boost Plus BID Provide Snacks BID Provide Multivitamin with minerals daily  NUTRITION DIAGNOSIS: Inadequate oral intake related to poor appetite as evidenced by pt's report of 19% weight loss in 3 months.   Goal: Pt to meet >/= 90% of their estimated nutrition needs   Monitor:  PO intake, weight trend, labs  Reason for Assessment: Malnutrition Screening Tool, score of 3  63 y.o. female  Admitting Dx: <principal problem not specified>  ASSESSMENT: 63 y.o. female with a PMH of cluster HAs, anxiety, urinary incontinence, insomnia, and uterine/endometrial cancer who presents with acute worsening ataxia, memory changes, and HAs concerning for a cerebellar mass.  Pt states that she used to weigh 135 lbs but, 2-3 months ago she lost her appetite, started eating poorly, and lost 15 lbs. She states that for the past 2-3 months she has only been eating a few bites at each meal; she drinks a lot of coffee. Pt states she lost down to 110 lbs. Her husband at bedside confirms pt has been eating very poorly. No evidence of weight loss per weight history. Pt denies any nausea, vomiting, constipation, diarrhea, or abdominal pain. Labs reviewed.    Nutrition Focused Physical Exam:  Subcutaneous Fat:  Orbital Region: wnl Upper Arm Region: mild wasting Thoracic and Lumbar Region: NA  Muscle:  Temple Region: wnl Clavicle Bone Region: mild wasting Clavicle and Acromion Bone Region: wnl Scapular Bone Region: mild wasting Dorsal Hand: mild wasting Patellar Region: mild wasting Anterior Thigh Region: mild  wasting Posterior Calf Region: wnl  Edema: none noted  Height: Ht Readings from Last 1 Encounters:  07/31/13 5\' 6"  (1.676 m)    Weight: Wt Readings from Last 1 Encounters:  08/28/13 136 lb 11 oz (62 kg)    Ideal Body Weight: 130 lbs  % Ideal Body Weight: 105%  Wt Readings from Last 10 Encounters:  08/28/13 136 lb 11 oz (62 kg)  07/31/13 137 lb (62.143 kg)  12/25/12 138 lb (62.596 kg)  09/11/12 138 lb (62.596 kg)  05/13/12 143 lb (64.864 kg)  04/02/12 141 lb (63.957 kg)  03/13/12 146 lb (66.225 kg)  02/12/12 142 lb (64.411 kg)  02/05/12 139 lb (63.05 kg)  01/12/12 145 lb (65.772 kg)    Usual Body Weight: 135 lbs  % Usual Body Weight: 100%  BMI:  Body mass index is 22.07 kg/(m^2).  Estimated Nutritional Needs: Kcal: 1600-1800 Protein: 60-70 grams Fluid: 1.8 L/day  Skin: intact  Diet Order: General  EDUCATION NEEDS: -No education needs identified at this time   Intake/Output Summary (Last 24 hours) at 08/29/13 1227 Last data filed at 08/28/13 1700  Gross per 24 hour  Intake    360 ml  Output      0 ml  Net    360 ml    Last BM: PTA  Labs:   Recent Labs Lab 08/28/13 1425  NA 144  K 4.1  CL 107  CO2 27  BUN 8  CREATININE 0.69  CALCIUM 9.5  MG 2.0  PHOS 3.1  GLUCOSE 89    CBG (last 3)   Recent Labs  08/28/13 1101  GLUCAP 89  Scheduled Meds: . amitriptyline  25 mg Oral QHS  . pantoprazole  40 mg Oral Daily  . topiramate  25 mg Oral Daily    Continuous Infusions: . sodium chloride      Past Medical History  Diagnosis Date  . Cluster headache   . Anxiety   . Insomnia secondary to anxiety   . Incontinence of urine     Past Surgical History  Procedure Laterality Date  . Appendectomy    . Abdominal hysterectomy      Pryor Ochoa RD, LDN Inpatient Clinical Dietitian Pager: [phone] After Hours Pager: [phone]

## 2013-08-29 NOTE — Progress Notes (Signed)
Discharge orders received. Pt for discharge home today. IV d/c'd. Pt given discharge instructions with verbalized understanding. Family in room to assist with discharge. Staff brought pt downstairs via wheelchair.   

## 2013-08-29 NOTE — Progress Notes (Signed)
Subjective: Much improved compared to yesterday.   After further discussion. When her right leg "gives out" on her, she will almost always have a throbbing headache start soon thereafter. She has had recurrent issues with losing vision to the right side intermittently.   Exam: Filed Vitals:   08/29/13 0632  BP: 111/51  Pulse: 65  Temp: 97.5 F (36.4 C)  Resp: 16   Gen: In bed, NAD MS: Awake, alert, interactvie and appropriate.  CN:OBSJG, Visual fields are much improved, able to count fingers in both hemifields.  Motor: no drift, 5/5 strenght throughout.  Sensory:intact to LT Cerebellar: FNF intact bilaterally.    Impression: 63 yo F with recurrent episodes of unsteadiness and right sided weakness and vision loss often accompanied by unilateral headache. With normal imaging, I suspect complicated migraine, and with improvement, I do not think that further evaluation is needed at this time.   She has been on amitriptyline for one month and has not noticed improvement, therefore will recommend changing to topamax as migraine preventative.   Recommendations: 1) Topamax 25mg  daily x 1 week, then 50mg  daily x 1 week, then 75mg  daily x 1 week, then 100mg  daily.  2) Could use compazine 10mg  PO and benadryl 25mg  PO as abortive agent, I would favor avoiding triptans given theoretical risk of stroke with basilar migraine.  3) Outpatient referral for neurology follow up.   Roland Rack, MD [facility] [phone]  If 7pm- 7am, please page neurology on call as listed in [place].

## 2013-08-29 NOTE — Discharge Summary (Signed)
FMTS ATTENDING  NOTE Myya Meenach,MD I  have seen and examined this patient, reviewed their chart. I have discussed this patient with the resident. I agree with the resident's findings, assessment and care plan. 

## 2013-08-29 NOTE — Progress Notes (Signed)
Family Medicine Teaching Service Daily Progress Note Intern Pager: [phone]  Patient name: Rachel Bryan Medical record number: [id number] Date of birth: 01/09/1951 Age: 63 y.o. Gender: female  Primary Care Provider: Nettey, Ralph, MD Consultants: Neurology Code Status: DNR  Pt Overview and Major Events to Date:  8/6 - Pt admitted for evaluation ataxia, memory loss, and HA 8/7 - Management of migraine HAs  Assessment and Plan:  Rachel Bryan is a 63 y.o. female with a PMH of cluster HAs, anxiety, urinary incontinence, insomnia, and uterine/endometrial cancer who presents with acute worsening ataxia, memory changes, and HAs most likely 2/2 complicated migraines.   #Ataxia and HA: Most likely 2/2 complicated migraines. Pt p/w worsening LE weakness and constant right-sided HAs over the past 4-5 months and a sudden inability to stand on 8/6. Pt's initial presentation was concerning for a cerebellar mass or hemorrhage or a potential infectious etiology like HAND or neurosyphillis. Migraines and GBS were also possibilities. Brain MRI showed no acute intracranial abnormalities or mass and CBC, CMP, TSH, Vit B12, HIV and RPR were all wnl. ESR was mildly elevated at 28. Pt was seen by Neurology on admission and again today.  - Per Neurology, appreciate recs: w/ nml imaging and rapid improvement overnight, suspect complicate migraine, no further eval is needed.  - Start Topamax 25mg today, will also help w/ pt's insomnia - Consider compazine and benadryl as an abortive agent, refrain from using triptans as there as a theoretical risk if stroke w/ complicated  migraines - D'c amitriptyline as this has not helped much w/ HAs in the past  - Care management for walker acquisition - Schedule outpt f/u w/ neurology  #Insomnia: Chronic. On home amitriptyline 25mg.  - D'c home amitriptyline  - Start topomax nightly as above   #Urinary incontinence: Chronic mixed urinary incontinence. Stable. Potentially  somewhat related to her likely intracranial process, however unclear at this time  - Follow-up outpatient re: options for management.   FEN/GI: regular diet   Prophylaxis: DVT: continue SCDs.   Disposition: D'c home today with close f/u w/ PCP and outpt Neurology referral  Subjective:  Pt reports rapid improvement of symptoms from yesterday and is ready to go home. Reports some shakiness w/ walking, but reports that is chronic and improves throughout the day. Otherwise, no acute complaints.  Objective: Temp:  [97.5 F (36.4 C)-98.9 F (37.2 C)] 97.5 F (36.4 C) (08/07 0632) Pulse Rate:  [65-98] 65 (08/07 0632) Resp:  [16-20] 16 (08/07 0632) BP: (100-161)/(48-88) 111/51 mmHg (08/07 0632) SpO2:  [95 %-100 %] 96 % (08/07 0632) Weight:  [136 lb 11 oz (62 kg)] 136 lb 11 oz (62 kg) (08/06 1300)  Physical Exam: General: Sitting in bed, comfortably Cardiovascular: RRR, no mrg  Respiratory: CTAB, no wheezes or crackles noted on exam  Abdomen: soft, non-tender, non-distended  Extremities: non-edematous, visible superficial veins MS: Awake, alert, interactvie and appropriate.  CN:PERRL, Visual fields are much improved, able to count fingers in both hemifields.  Motor: no drift, 5/5 strenght throughout.  Sensory: intact to LT  Cerebellar: FNF intact bilaterally Gait: mild shakiness, but steady on feet  Laboratory: none  Imaging/Diagnostic Tests: Brain MRI: No acute intracranial abnormality or mass   Lena R Sharma, Med Student 08/29/2013, 8:20 AM  FPTS Upper-Level Resident Addendum  I have independently interviewed and examined the patient. I have discussed the above with Mrs. Sharma and agree with her documentation as above. The above reflects her original note. Please see discharge   summary signed by me for final exam / assessment / plan.   M , MD PGY-3, [facility] FPTS Service pager: [phone] (text pages welcome through [facility])  

## 2013-08-29 NOTE — Discharge Summary (Signed)
[facility] Discharge Summary  Patient name: Rachel Bryan Medical record number: [id number] Date of birth: 07-04-1950 Age: 63 y.o. Gender: female Date of Admission: 08/28/2013  Date of Discharge: 08/29/2013 Admitting Physician: Andrena Mews, MD  Primary Care Provider: Cordelia Poche, MD Consultants: Neurology  Indication for Hospitalization: Ataxia, memory loss, and HA concerning for cerebellar mass or hemorrhage   Discharge Diagnoses/Problem List:  Diagnosis:  Complicated Migraine HA Ataxia Insomnia Chronic mixed urinary incontinence   Disposition: Home  Discharge Condition: Stable  Discharge Exam:  Vitals: Temp: 97.5 F (36.4 C)   Pulse Rate: 65  Resp: 16  BP: 111/51 mmHg  SpO2: [95 %-100 %] 96 %  Weight: 136 lb 11 oz (62 kg)   Physical Exam: General: Sitting in bed, comfortably  Cardiovascular: RRR, no mrg  Respiratory: CTAB, no wheezes or crackles noted on exam  Abdomen: soft, non-tender, non-distended  Extremities: non-edematous, visible superficial veins  MS: Awake, alert, interactvie and appropriate.  XN:ATFTD, Visual fields are much improved, able to count fingers in both hemifields.  Motor: no drift, 5/5 strenght throughout.  Sensory: intact to LT  Cerebellar: FNF intact bilaterally  Gait: mild shakiness, but steady on feet  Brief Hospital Course:  [name] was admitted from clinic on 08/28/13 with worsening LE weakness and constant right-sided HAs over the past 4-5 months and a sudden inability to stand and ataxia on 8/6. Pt's initial presentation was concerning for a cerebellar mass or hemorrhage or a potential infectious etiology like HAND or neurosyphillis. Migraines and GBS were also considered on admission. Brain MRI showed no acute intracranial abnormalities or mass and CBC, CMP, CRP, TSH, Vit B12, HIV and RPR were all wnl. Pt was also followed by Neurology during this admission. On day of discharge, pt's symptoms were  mostly resolved, with the exception of some shakiness and instability with walking that pt reports is chronic. The final recommendations after seeing pt on day of discharge was that, in setting of normal imaging and pt's rapid improvement overnight, pt's symptoms were most likely 2/2 complicated migraine and no further work-up is needed at this time; they will start pt on Topamax maintenance therapy and compazine/benadryl for abortive therapy and continue to follow with her as an outpatient.   Issues for Follow Up:  1. Complicated migraines - f/u on initiation of migraine meds and referral to outpatient Neurology - started Topamax with plan to do 25 mg for one week, then increase 25 mg per week to max 100 mg daily - STOPPED amitriptyline -- not helpful for headaches and could over-sedate with Topamax - referral to neuro needs to be placed by outpt PCP's office  2. Persistent gait shakiness/instability - consider spinal MRI for potential transverse myelitis - may need to defer this to neuro vs calling them to discuss  3. Insomnia - discontinued amitriptyline this hospitalization as pt reported that this was not very helpful. Topamax may help with this, but re-assess as need.  4. Urinary Incontinence - Pt reports that this is chronic after her vaginal deliveries. Most consistent with mixed urinary incontinence, although can also be 2/2 transverse myelitis as above. If mixed urinary incontinence, can address options for management with pessary or other alternatives (i.e., referral to urology vs OBGYN).   Significant Procedures: none  Significant Labs and Imaging:   Recent Labs Lab 08/28/13 1425  WBC 7.2  HGB 12.8  HCT 39.5  PLT 279    Recent Labs Lab 08/28/13 1425  NA  144  K 4.1  CL 107  CO2 27  GLUCOSE 89  BUN 8  CREATININE 0.69  CALCIUM 9.5  MG 2.0  PHOS 3.1  ALKPHOS 71  AST 13  ALT 8  ALBUMIN 3.7   Vitamin B12 8/6: 286 (nl) CRP <0.5 (Nl) Sed rate 8/6 28 (mildly  elevated, ?age-related normal) HIV and RPR 8/6: nonreactive  MRI Brain with and without contrast 8/7 @1537  FINDINGS:  There is no evidence of acute infarct, intracranial hemorrhage,  mass, midline shift, or extra-axial fluid collection. Ventricles and  sulci are normal for age. No significant supratentorial white matter  disease is seen. There is minimal T2 hyperintensity in the pons,  nonspecific but may reflect minimal chronic ischemic change. There  is no abnormal enhancement.  Orbits are unremarkable. There is partial opacification of a  posterior left ethmoid air cell. Mastoid air cells are clear. Major  intracranial vascular flow voids are preserved.  IMPRESSION:  No acute intracranial abnormality or mass.  Results/Tests Pending at Time of Discharge: Folate level  Discharge Medications:    Medication List    STOP taking these medications       amitriptyline 25 MG tablet  Commonly known as:  ELAVIL      TAKE these medications       hydrOXYzine 10 MG tablet  Commonly known as:  ATARAX/VISTARIL  Take 1 tablet (10 mg total) by mouth at bedtime as needed for anxiety.     omeprazole 20 MG capsule  Commonly known as:  PRILOSEC  Take 20 mg by mouth daily as needed (for heartburn).     prochlorperazine 10 MG tablet  Commonly known as:  COMPAZINE  Take 1 tablet (10 mg total) by mouth every 6 (six) hours as needed for nausea or vomiting (headache).     topiramate 25 MG tablet  Commonly known as:  TOPAMAX  Take 1 tablet (25 mg total) by mouth daily.        Discharge Instructions: Please refer to Patient Instructions section of EMR for full details.  Patient was counseled important signs and symptoms that should prompt return to medical care, changes in medications, dietary instructions, activity restrictions, and follow up appointments.   Follow-Up Appointments: Follow-up appointment scheduled for Monday, 09/02/13 with Dr. Teryl Lucy at 10:15am  Follow-up Information    Follow up with Cordelia Poche, MD On 09/01/2013. [facility] follow-up appointment at 10:15 AM.)    Specialty:  Family Medicine   Contact information:   [place] [address] [phone]      Alain Marion, Med Student 08/29/2013, 12:19 PM  FPTS Upper Level Resident Addendum  I independently interviewed and examined this patient. Her discharge planning as outlined above was discussed and arranged by the [facility] team as a whole, including attending physician Dr. Gwendlyn Deutscher. I have discussed the above with Mrs. Donneta Romberg and agree with her documentation as above. The above reflects her original note which I have edited for clarification without changing the content or character of the documentation.  Emmaline Kluver, MD PGY-3, [facility] 08/29/2013, 1:01 PM FPTS Service pager: [phone] (text pages welcome through [facility])

## 2013-09-01 ENCOUNTER — Ambulatory Visit (INDEPENDENT_AMBULATORY_CARE_PROVIDER_SITE_OTHER): Payer: Medicaid Other | Admitting: Family Medicine

## 2013-09-01 ENCOUNTER — Encounter: Payer: Self-pay | Admitting: Family Medicine

## 2013-09-01 VITALS — BP 123/74 | HR 86 | Temp 97.9°F | Ht 66.0 in | Wt 135.3 lb

## 2013-09-01 DIAGNOSIS — G43109 Migraine with aura, not intractable, without status migrainosus: Secondary | ICD-10-CM

## 2013-09-01 DIAGNOSIS — G4700 Insomnia, unspecified: Secondary | ICD-10-CM

## 2013-09-01 NOTE — Patient Instructions (Addendum)
Rachel Bryan, it was a pleasure seeing you today. Today we talked about your hospital visit. I'm glad you are doing better. I have put in for a referral for neurology.  Please follow-up with me in 4 weeks.  If you have any questions or concerns, please do not hesitate to call the office at [phone].  Sincerely,  Cordelia Poche, MD

## 2013-09-01 NOTE — Assessment & Plan Note (Signed)
Will promote behavioral modification since patient had good sleep in the hospital. Suspect too much stimulation at home. Patient will try. Will follow-up.

## 2013-09-01 NOTE — Progress Notes (Signed)
   Subjective:    Patient ID: Rachel Bryan, female    DOB: 05-04-1950, 63 y.o.   MRN: [id number]  HPI  Patient presents for follow-up for recent hospital visit.  Complicated migraine: Patient's symptoms improved with Topamax and promethazine. Big stress in her life has also been removed as a woman that has caused her stress has moved away.  Insomnia: Patient not able to sleep when she lays down. She lies down in her bed with her phone waiting to fall asleep. She goes to bed around 10 and falls asleep. When she was admitted to the hospital, she states she slept well. Amitriptyline has not helped.  Review of Systems Per HPI    Objective:   Physical Exam  Constitutional: She appears well-developed and well-nourished.          Assessment & Plan:

## 2013-09-16 ENCOUNTER — Other Ambulatory Visit: Payer: Self-pay | Admitting: *Deleted

## 2013-09-17 MED ORDER — PROCHLORPERAZINE MALEATE 10 MG PO TABS: 10.0000 mg | ORAL_TABLET | Freq: Four times a day (QID) | ORAL | Status: DC | PRN

## 2013-09-24 ENCOUNTER — Ambulatory Visit: Payer: Medicaid Other | Admitting: Neurology

## 2013-09-25 ENCOUNTER — Telehealth: Payer: Self-pay | Admitting: Neurology

## 2013-09-25 NOTE — Telephone Encounter (Signed)
Pt called 09/24/13 to r/s an appt scheduled for the same day. Appt was moved to October but 09/24/13 appt will be marked as a no show b/c pt did not call until the day of. A no show letter was not sent / Sherri S.

## 2013-09-26 ENCOUNTER — Other Ambulatory Visit: Payer: Self-pay | Admitting: *Deleted

## 2013-09-26 NOTE — Telephone Encounter (Signed)
Needs a refill on topamax (only one left).  Wants to know if she needs to continue taking the propanlol (she recently found the bottle) which has about 10 left

## 2013-10-02 ENCOUNTER — Encounter: Payer: Medicaid Other | Admitting: Family Medicine

## 2013-10-03 MED ORDER — TOPIRAMATE 25 MG PO TABS: 25.0000 mg | ORAL_TABLET | Freq: Every day | ORAL | Status: DC

## 2013-10-06 NOTE — Telephone Encounter (Signed)
Spoke with patient and informed her that topamax was sent in

## 2013-10-23 ENCOUNTER — Other Ambulatory Visit: Payer: Self-pay | Admitting: *Deleted

## 2013-10-24 ENCOUNTER — Ambulatory Visit: Payer: Medicaid Other | Admitting: Neurology

## 2013-10-28 ENCOUNTER — Other Ambulatory Visit: Payer: Self-pay | Admitting: *Deleted

## 2013-11-03 ENCOUNTER — Other Ambulatory Visit: Payer: Self-pay | Admitting: *Deleted

## 2013-11-05 NOTE — Telephone Encounter (Signed)
Patient is coming in 10/30 for refill appointment with Dr. Lonny Prude

## 2013-11-21 ENCOUNTER — Ambulatory Visit (INDEPENDENT_AMBULATORY_CARE_PROVIDER_SITE_OTHER): Payer: Medicaid Other | Admitting: Family Medicine

## 2013-11-21 ENCOUNTER — Encounter: Payer: Self-pay | Admitting: Family Medicine

## 2013-11-21 VITALS — BP 142/77 | HR 90 | Temp 98.2°F | Ht 66.0 in | Wt 138.5 lb

## 2013-11-21 DIAGNOSIS — F32A Depression, unspecified: Secondary | ICD-10-CM

## 2013-11-21 DIAGNOSIS — Z23 Encounter for immunization: Secondary | ICD-10-CM

## 2013-11-21 DIAGNOSIS — F322 Major depressive disorder, single episode, severe without psychotic features: Secondary | ICD-10-CM

## 2013-11-21 DIAGNOSIS — F329 Major depressive disorder, single episode, unspecified: Secondary | ICD-10-CM

## 2013-11-21 MED ORDER — IBUPROFEN 600 MG PO TABS: 600.0000 mg | ORAL_TABLET | Freq: Three times a day (TID) | ORAL | Status: DC | PRN

## 2013-11-21 MED ORDER — CITALOPRAM HYDROBROMIDE 20 MG PO TABS: 20.0000 mg | ORAL_TABLET | Freq: Every day | ORAL | Status: DC

## 2013-11-21 NOTE — Patient Instructions (Addendum)
Thank you for coming to see me today. It was a pleasure. Today we talked about:   Depressed mood: I will prescribe Celexa to help with your mood. If you have feelings of hurting yourself, please call [phone].  Please make an appointment to see me in 2 weeks for follow-up.  If you have any questions or concerns, please do not hesitate to call the office at [phone].  Sincerely,  Cordelia Poche, MD   RICE: Routine Care for Injuries The routine care of many injuries includes Rest, Ice, Compression, and Elevation (RICE). HOME CARE INSTRUCTIONS  Rest is needed to allow your body to heal. Routine activities can usually be resumed when comfortable. Injured tendons and bones can take up to 6 weeks to heal. Tendons are the cord-like structures that attach muscle to bone.  Ice following an injury helps keep the swelling down and reduces pain.  Put ice in a plastic bag.  Place a towel between your skin and the bag.  Leave the ice on for 15-20 minutes, 3-4 times a day, or as directed by your health care provider. Do this while awake, for the first 24 to 48 hours. After that, continue as directed by your caregiver.  Compression helps keep swelling down. It also gives support and helps with discomfort. If an elastic bandage has been applied, it should be removed and reapplied every 3 to 4 hours. It should not be applied tightly, but firmly enough to keep swelling down. Watch fingers or toes for swelling, bluish discoloration, coldness, numbness, or excessive pain. If any of these problems occur, remove the bandage and reapply loosely. Contact your caregiver if these problems continue.  Elevation helps reduce swelling and decreases pain. With extremities, such as the arms, hands, legs, and feet, the injured area should be placed near or above the level of the heart, if possible. SEEK IMMEDIATE MEDICAL CARE IF:  You have persistent pain and swelling.  You develop redness, numbness, or unexpected  weakness.  Your symptoms are getting worse rather than improving after several days. These symptoms may indicate that further evaluation or further X-rays are needed. Sometimes, X-rays may not show a small broken bone (fracture) until 1 week or 10 days later. Make a follow-up appointment with your caregiver. Ask when your X-ray results will be ready. Make sure you get your X-ray results. Document Released: 04/23/2000 Document Revised: 01/14/2013 Document Reviewed: 06/10/2010 [facility] Patient Information 2015 [place], [place]. This information is not intended to replace advice given to you by your health care provider. Make sure you discuss any questions you have with your health care provider.

## 2013-11-21 NOTE — Progress Notes (Signed)
    Subjective    Rachel Bryan is a 63 y.o. female that presents for an office visit.   1. Headache: Similar to previous headache. Right parietal area. Has been taking topamax as needed.   2. Insomnia: Has been taking melatonin but does not help. Associated with depressed mood and irritability.   3. Left foot pain: 3 days ago. Tylenol has not helped. No trauma. Epson salts have not helped.   History  Substance Use Topics  . Smoking status: Current Every Day Smoker -- 0.25 packs/day  . Smokeless tobacco: Not on file  . Alcohol Use: No    Allergies  Allergen Reactions  . Levaquin [Levofloxacin] Other (See Comments)    Per pt report.  Burning veins   . Penicillins Palpitations  . Codeine Hives and Other (See Comments)    sweating  . Demerol [Meperidine] Hives  . Morphine And Related Other (See Comments)    Hallucinations   . Sulfa Antibiotics Hives    No orders of the defined types were placed in this encounter.    ROS  Per HPI   Objective   BP 142/77  Pulse 90  Temp(Src) 98.2 F (36.8 C) (Oral)  Ht 5\' 6"  (1.676 m)  Wt 138 lb 8 oz (62.823 kg)  BMI 22.37 kg/m2  General: Fair appearing female. No distress Musculoskeletal: Left foot with tenderness along lateral aspect of calcaneus Neuro: Alert Psych: flat affect  Assessment and Plan   Please refer to problem based charting of assessment and plan

## 2013-11-24 ENCOUNTER — Other Ambulatory Visit: Payer: Self-pay | Admitting: *Deleted

## 2013-11-27 ENCOUNTER — Ambulatory Visit (INDEPENDENT_AMBULATORY_CARE_PROVIDER_SITE_OTHER): Payer: Medicaid Other | Admitting: Neurology

## 2013-11-27 ENCOUNTER — Encounter: Payer: Self-pay | Admitting: Neurology

## 2013-11-27 VITALS — BP 124/74 | HR 86 | Resp 18 | Ht 65.0 in | Wt 139.0 lb

## 2013-11-27 DIAGNOSIS — R51 Headache: Secondary | ICD-10-CM

## 2013-11-27 DIAGNOSIS — R519 Headache, unspecified: Secondary | ICD-10-CM

## 2013-11-27 MED ORDER — TOPIRAMATE 25 MG PO TABS: ORAL_TABLET | ORAL | Status: DC

## 2013-11-27 NOTE — Patient Instructions (Signed)
1. Increase Topamax 25mg : Take 2 tablets at bedtime for 1 week, then increase to 3 tablets at bedtime for 1 week, then increase to 4 tablets and continue 2. Continue Celexa daily 3. Use your cane for balance 4. Follow-up in 2 months

## 2013-11-27 NOTE — Progress Notes (Signed)
NEUROLOGY CONSULTATION NOTE  Rachel Bryan MRN: [id number] DOB: 05-Feb-1950  Referring provider: Dr. Cordelia Poche Primary care provider: Dr. Cordelia Poche  Reason for consult:  headaches  Dear Dr Lonny Prude:  Thank you for your kind referral of Rachel Bryan for consultation of the above symptoms. Although her history is well known to you, please allow me to reiterate it for the purpose of our medical record. The patient was accompanied to the clinic by her friend who also provides collateral information. Records and images were personally reviewed where available.  HISTORY OF PRESENT ILLNESS: This is a 63 year old right-handed woman with a history of uterine/endometrial cancer, anxiety, depression, and headaches since age 34, presenting for chronic daily headaches.  She was admitted overnight at [facility] on 08/28/13 when she presented with worsening gait instability, memory changes, and headaches at her PCP's office. She started having the gait instability 4-5 months prior, mainly weakness in her right leg. The day prior to admission, she was very unstable and unable to stand on her own. She denied any pain or paresthesias. She also reported that around that time she began having memory issues of where she placed items or names of close friends. She reported headaches over the right temporal region which have worsened over a few months, waking her up from sleep. She reported blurred vision, R>L. She was evaluated by Neurology, exam had shown 3/5 strength on the right LE, 4/5 on right UE. Bloodwork done showed unremarkable CBC, CMP, TSH, RPR, HIV Ab, ESR, CRP. Vitamin B12 was low normal 286. She had an MRI brain with and without contrast which I personally reviewed, unremarkable with minimal chronic microvascular change in the pons. No abnormal enhancement. With normal imaging and significant improvement in strength the next day, further questioning had shown that her right leg would "give out" followed  almost always be a throbbing headache soon after. Symptoms felt to be due to complicated migraine. She was discharged home on Topamax uptitration, however patient has only been taking 37m dose, which she has been spreading out and at times would only take prn.   She feels the Topamax is not helping. Today she reports that headaches are over the back of her head, more on the right side. Pain is at 8/10, with pressure sensation with no clear triggers or alleviating factors. There is no associated nausea, vomiting, photo or phonophobia. She has intermittent tinnitus, no hearing changes or congestion. She denies any neck/back pain. She reports headaches started at age 89 increased in frequency when she was 89 and since then has been having them every other day. Over the past week, she has been having daily headaches. She has a history of insomnia, usually sleeping from 4am to 9am. She has tried amitriptyline, Ambien, Trazodone, melatonin with no effect. For the headaches, she has tried Fiorinal, Tylenol, Ibuprofen, with no effect. Goody powder helps sometimes, she has taken 3 in the past month. She currently denies any focal numbness/tingling but has had tingling in fingers of both hands. She loses her balance frequently, mostly due to the right leg, last fall was a couple of months ago. She denies any bowel/bladder dysfunction. She was started on Celexa yesterday. Her mother and daughter have "cluster migraines."   PAST MEDICAL HISTORY: Past Medical History  Diagnosis Date  . Cluster headache   . Anxiety   . Insomnia secondary to anxiety   . Incontinence of urine     PAST SURGICAL HISTORY: Past Surgical History  Procedure Laterality Date  . Appendectomy    . Abdominal hysterectomy      MEDICATIONS: Current Outpatient Prescriptions on File Prior to Visit  Medication Sig Dispense Refill  . citalopram (CELEXA) 20 MG tablet Take 1 tablet (20 mg total) by mouth daily. 30 tablet 0  . ibuprofen  (ADVIL,MOTRIN) 600 MG tablet Take 1 tablet (600 mg total) by mouth every 8 (eight) hours as needed. 30 tablet 0   No current facility-administered medications on file prior to visit.    ALLERGIES: Allergies  Allergen Reactions  . Levaquin [Levofloxacin] Other (See Comments)    Per pt report.  Burning veins   . Penicillins Palpitations  . Codeine Hives and Other (See Comments)    sweating  . Demerol [Meperidine] Hives  . Morphine And Related Other (See Comments)    Hallucinations   . Sulfa Antibiotics Hives    FAMILY HISTORY: Family History  Problem Relation Age of Onset  . Transient ischemic attack Mother   . Cancer Mother     lung (smoker)  . Anxiety disorder Mother   . Cancer Father     prostate  . Hypertension Father   . Cancer Maternal Grandmother     leukemia    SOCIAL HISTORY: History   Social History  . Marital Status: Divorced    Spouse Name: N/A    Number of Children: N/A  . Years of Education: N/A   Occupational History  . Not on file.   Social History Main Topics  . Smoking status: Current Every Day Smoker -- 0.25 packs/day  . Smokeless tobacco: Not on file  . Alcohol Use: No  . Drug Use: No  . Sexual Activity: Yes    Birth Control/ Protection: Surgical   Other Topics Concern  . Not on file   Social History Narrative    REVIEW OF SYSTEMS: Constitutional: No fevers, chills, or sweats, no generalized fatigue, change in appetite Eyes: No visual changes, double vision, eye pain Ear, nose and throat: No hearing loss, ear pain, nasal congestion, sore throat Cardiovascular: No chest pain, palpitations Respiratory:  No shortness of breath at rest or with exertion, wheezes GastrointestinaI: No nausea, vomiting, diarrhea, abdominal pain, fecal incontinence Genitourinary:  No dysuria, urinary retention or frequency Musculoskeletal:  No neck pain, back pain Integumentary: No rash, pruritus, skin lesions Neurological: as above Psychiatric: +  depression, insomnia, anxiety Endocrine: No palpitations, fatigue, diaphoresis, mood swings, change in appetite, change in weight, increased thirst Hematologic/Lymphatic:  No anemia, purpura, petechiae. Allergic/Immunologic: no itchy/runny eyes, nasal congestion, recent allergic reactions, rashes  PHYSICAL EXAM: Filed Vitals:   11/27/13 1455  BP: 124/74  Pulse: 86  Resp: 18   General: No acute distress, disheveled appearance Head:  Normocephalic/atraumatic, no temporal or occipital tenderness Eyes: Fundoscopic exam shows bilateral sharp discs, no vessel changes, exudates, or hemorrhages Neck: supple, no paraspinal tenderness, full range of motion Back: No paraspinal tenderness Heart: regular rate and rhythm Lungs: Clear to auscultation bilaterally. Vascular: No carotid bruits. Skin/Extremities: No rash, no edema Neurological Exam: Mental status: alert and oriented to person, place, and time, no dysarthria or aphasia, Fund of knowledge is appropriate.  Recent and remote memory are intact.  Attention and concentration are normal.    Able to name objects and repeat phrases. Cranial nerves: CN I: not tested CN II: pupils equal, round and reactive to light, visual fields intact, fundi unremarkable. CN III, IV, VI:  full range of motion, no nystagmus, no ptosis CN V: facial sensation  intact CN VII: upper and lower face symmetric CN VIII: hearing intact to finger rub CN IX, X: gag intact, uvula midline CN XI: sternocleidomastoid and trapezius muscles intact CN XII: tongue midline Bulk & Tone: normal, no fasciculations. Motor: 5/5 throughout with no pronator drift. Sensation: intact to light touch, cold, pin, vibration and joint position sense.  No extinction to double simultaneous stimulation.  Romberg test positive Deep Tendon Reflexes: +1 on both UE, bilateral patella, absent ankle jerks bilaterally. Plantar responses: downgoing bilaterally Cerebellar: no incoordination on finger to  nose, heel to shin. No dysdiadochokinesia Gait: slow and cautious, able to tandem walk with some difficulty Tremor: none  IMPRESSION: This is a 63 year old right-handed woman presenting with chronic daily headaches, who had episodes of right-sided weakness followed by headaches last August 2015 diagnosed as complicated migraines. She denies any further similar episodes of right-sided weakness, however feels that her balance continues to be off. She does have positive Romberg sign and absent reflexes, current balance issues may be due to neuropathy. Exam otherwise non-focal, MRI brain normal. We discussed chronic daily headaches and expectations and goal with preventative medications. She has only been taking low dose Topamax prn and will start taking this regularly with uptitration to 146m daily over the next 3 weeks. She will continue Celexa, there is evidence that SSRIs help with headaches as well. She was advised to use her cane regularly for balance. She will follow-up in 2 months.  Thank you for allowing me to participate in the care of this patient. Please do not hesitate to call for any questions or concerns.   KEllouise Newer M.D.  CC: Dr. NLonny Prude

## 2013-11-28 ENCOUNTER — Encounter: Payer: Self-pay | Admitting: Neurology

## 2013-11-28 ENCOUNTER — Ambulatory Visit: Payer: Medicaid Other | Admitting: Neurology

## 2013-11-30 NOTE — Assessment & Plan Note (Addendum)
Patient with depressed mood. Seems to be affecting a lot of her other activities, including relationship with her significant other. Will try on Celexa 20mg  daily. Follow-up in two weeks.

## 2013-12-11 ENCOUNTER — Other Ambulatory Visit: Payer: Self-pay | Admitting: *Deleted

## 2013-12-17 ENCOUNTER — Other Ambulatory Visit: Payer: Self-pay | Admitting: Family Medicine

## 2013-12-17 MED ORDER — CITALOPRAM HYDROBROMIDE 20 MG PO TABS: 20.0000 mg | ORAL_TABLET | Freq: Every day | ORAL | Status: DC

## 2013-12-17 MED ORDER — IBUPROFEN 600 MG PO TABS: 600.0000 mg | ORAL_TABLET | Freq: Three times a day (TID) | ORAL | Status: DC | PRN

## 2013-12-17 NOTE — Telephone Encounter (Signed)
Pt called and needs refills on her Celexa and Ibuprofen 600 mg. She will be out of Celexa on Friday and she is already out of Ibuprofen. jw

## 2013-12-22 ENCOUNTER — Ambulatory Visit: Payer: Medicaid Other | Admitting: Family Medicine

## 2013-12-25 ENCOUNTER — Ambulatory Visit: Payer: Medicaid Other | Admitting: Family Medicine

## 2014-01-09 ENCOUNTER — Ambulatory Visit: Payer: Medicaid Other | Admitting: Family Medicine

## 2014-01-12 ENCOUNTER — Other Ambulatory Visit: Payer: Self-pay | Admitting: *Deleted

## 2014-01-19 ENCOUNTER — Other Ambulatory Visit: Payer: Self-pay | Admitting: *Deleted

## 2014-01-21 ENCOUNTER — Other Ambulatory Visit: Payer: Self-pay | Admitting: Family Medicine

## 2014-01-21 ENCOUNTER — Ambulatory Visit: Payer: Medicaid Other | Admitting: Family Medicine

## 2014-01-21 NOTE — Telephone Encounter (Signed)
Pt is out of celexa and ibuprofen, called pharmacy but doesn't think they sent the request for her ibuprofen, pt goes to [facility]/[address].

## 2014-01-26 MED ORDER — CITALOPRAM HYDROBROMIDE 20 MG PO TABS: 20.0000 mg | ORAL_TABLET | Freq: Every day | ORAL | Status: DC

## 2014-01-26 MED ORDER — IBUPROFEN 600 MG PO TABS: 600.0000 mg | ORAL_TABLET | Freq: Three times a day (TID) | ORAL | Status: DC | PRN

## 2014-01-27 ENCOUNTER — Telehealth: Payer: Self-pay | Admitting: Neurology

## 2014-01-27 ENCOUNTER — Ambulatory Visit: Payer: Medicaid Other | Admitting: Neurology

## 2014-01-27 NOTE — Telephone Encounter (Signed)
Pt resch appt to see Dr Delice Lesch from 01-27-14 to 02-24-14 sue to her being sick

## 2014-01-28 NOTE — Telephone Encounter (Signed)
appt scheduled for 01/27/14 marked as a no show due to less than 24 hours notice to r/s. A no show letter will not be sent as patient has already r/s / Rachel Bryan

## 2014-02-04 ENCOUNTER — Ambulatory Visit: Payer: Medicaid Other | Admitting: Family Medicine

## 2014-02-24 ENCOUNTER — Ambulatory Visit: Payer: Medicaid Other | Admitting: Neurology

## 2014-02-25 ENCOUNTER — Telehealth: Payer: Self-pay | Admitting: Neurology

## 2014-02-25 NOTE — Telephone Encounter (Signed)
Pt no showed 02/24/14 appt w/ Dr. Delice Lesch. Appt verbally confirmed with patient during reminder calls.  Please note - this is the 4th no show for this patient since Sept 2015 - which includes 2 no showed NP appts. How do you want to proceed? Pleased advise - Sherri

## 2014-02-26 ENCOUNTER — Ambulatory Visit: Payer: Medicaid Other | Admitting: Family Medicine

## 2014-03-02 ENCOUNTER — Other Ambulatory Visit: Payer: Self-pay | Admitting: *Deleted

## 2014-03-02 MED ORDER — CITALOPRAM HYDROBROMIDE 20 MG PO TABS: 20.0000 mg | ORAL_TABLET | Freq: Every day | ORAL | Status: DC

## 2014-03-12 NOTE — Telephone Encounter (Signed)
Noted / Sherri

## 2014-03-12 NOTE — Telephone Encounter (Signed)
Pls let her know that we will r/s for 1 more visit, if n/s, we will follow office policy. Thanks

## 2014-03-30 ENCOUNTER — Encounter: Payer: Self-pay | Admitting: Family Medicine

## 2014-03-30 ENCOUNTER — Ambulatory Visit (INDEPENDENT_AMBULATORY_CARE_PROVIDER_SITE_OTHER): Payer: Medicaid Other | Admitting: Family Medicine

## 2014-03-30 VITALS — BP 135/74 | HR 115 | Temp 97.8°F | Ht 65.0 in | Wt 132.7 lb

## 2014-03-30 DIAGNOSIS — R51 Headache: Secondary | ICD-10-CM

## 2014-03-30 DIAGNOSIS — F419 Anxiety disorder, unspecified: Secondary | ICD-10-CM | POA: Diagnosis not present

## 2014-03-30 DIAGNOSIS — F32A Depression, unspecified: Secondary | ICD-10-CM

## 2014-03-30 DIAGNOSIS — G43909 Migraine, unspecified, not intractable, without status migrainosus: Secondary | ICD-10-CM

## 2014-03-30 DIAGNOSIS — F329 Major depressive disorder, single episode, unspecified: Secondary | ICD-10-CM | POA: Diagnosis not present

## 2014-03-30 DIAGNOSIS — G43109 Migraine with aura, not intractable, without status migrainosus: Secondary | ICD-10-CM

## 2014-03-30 DIAGNOSIS — L748 Other eccrine sweat disorders: Secondary | ICD-10-CM

## 2014-03-30 DIAGNOSIS — R202 Paresthesia of skin: Secondary | ICD-10-CM

## 2014-03-30 DIAGNOSIS — R519 Headache, unspecified: Secondary | ICD-10-CM

## 2014-03-30 DIAGNOSIS — L750 Bromhidrosis: Secondary | ICD-10-CM | POA: Insufficient documentation

## 2014-03-30 LAB — COMPREHENSIVE METABOLIC PANEL
ALBUMIN: 4.6 g/dL (ref 3.5–5.2)
ALT: <8 U/L (ref 0–35)
AST: 11 U/L (ref 0–37)
Alkaline Phosphatase: 56 U/L (ref 39–117)
BUN: 18 mg/dL (ref 6–23)
CO2: 26 meq/L (ref 19–32)
Calcium: 9.8 mg/dL (ref 8.4–10.5)
Chloride: 106 mEq/L (ref 96–112)
Creat: 0.91 mg/dL (ref 0.50–1.10)
GLUCOSE: 86 mg/dL (ref 70–99)
Potassium: 4.4 mEq/L (ref 3.5–5.3)
Sodium: 139 mEq/L (ref 135–145)
TOTAL PROTEIN: 7 g/dL (ref 6.0–8.3)
Total Bilirubin: 0.3 mg/dL (ref 0.2–1.2)

## 2014-03-30 LAB — VITAMIN B12: VITAMIN B 12: 299 pg/mL (ref 211–911)

## 2014-03-30 LAB — TSH: TSH: 1.569 u[IU]/mL (ref 0.350–4.500)

## 2014-03-30 MED ORDER — SERTRALINE HCL 50 MG PO TABS: 50.0000 mg | ORAL_TABLET | Freq: Every day | ORAL | Status: DC

## 2014-03-30 NOTE — Progress Notes (Signed)
    Subjective    Rachel Bryan is a 64 y.o. female that presents for an office visit.   1. Tingling hands and feet: Started a few weeks ago. Started in one hand and moves to both. Now feels in feet. Symptoms appear when she gets upset.  2. Mood: Patient has not had Celexa in a while. States that it did not help you . Patient with significant social stressors at home. Mother with cancer, but she is not able to keep in contact with her. States she's not allowed to see her mom. She lives at home with a lot of people which is a stressful situation.   3. Odor from [employer]: noticed a few days. She has not done anything to remedy the odor. Normally cleans belly button every other day. No purulence or erythema. No pain  4. Migraine headaches: this is a chronic issue. Patient was followed by neurology but has not seen them in a while. She currently takes 1200mg  of ibuprofen qhs for her headaches which helps.   History  Substance Use Topics  . Smoking status: Current Every Day Smoker -- 0.25 packs/day  . Smokeless tobacco: Not on file  . Alcohol Use: No    Allergies  Allergen Reactions  . Levaquin [Levofloxacin] Other (See Comments)    Per pt report.  Burning veins   . Penicillins Palpitations  . Codeine Hives and Other (See Comments)    sweating  . Demerol [Meperidine] Hives  . Morphine And Related Other (See Comments)    Hallucinations   . Sulfa Antibiotics Hives    No orders of the defined types were placed in this encounter.    ROS  Per HPI   Objective   BP 135/74 mmHg  Pulse 115  Temp(Src) 97.8 F (36.6 C) (Oral)  Ht 5\' 5"  (1.651 m)  Wt 132 lb 11.2 oz (60.192 kg)  BMI 22.08 kg/m2  General: Fair appearing female, no distress Neuro: Sensation intact Psych: flat affect, episode of crying during encounter, normal speech  Assessment and Plan   Please refer to problem based charting of assessment and plan

## 2014-03-30 NOTE — Patient Instructions (Signed)
Thank you for coming to see me today. It was a pleasure. Today we talked about:   Mood: We will start Zoloft 50mg  daily. If you feel like hurting yourself or others, please call 911 immediately. This may be affecting your memory, appetite, and other symptoms, so I would like to tackle this issue first  Headaches: I will refer you to a new neurologist  Please make an appointment to see me in 2 weeks for follow-up.  If you have any questions or concerns, please do not hesitate to call the office at [phone].  Sincerely,  Cordelia Poche, MD

## 2014-04-01 ENCOUNTER — Telehealth: Payer: Self-pay | Admitting: *Deleted

## 2014-04-01 NOTE — Telephone Encounter (Signed)
-----   Message from Mariel Aloe, MD sent at 04/01/2014 12:13 AM EST ----- Please inform patient that results are normal. Thanks

## 2014-04-01 NOTE — Telephone Encounter (Signed)
LVM for patient to call back to inform of below lab results.

## 2014-04-01 NOTE — Telephone Encounter (Signed)
Spoke with patient and message was given

## 2014-04-03 NOTE — Assessment & Plan Note (Signed)
Complaint by patient. Not appreciated during visit. Discussed continued hygiene, including washing navel.

## 2014-04-03 NOTE — Assessment & Plan Note (Signed)
Most likely reason she is having tingling in hands and feet with increased emotions. Patient will try to change home environment to minimize stress. Symptoms are not persistent and will not prescribe medications at this time.

## 2014-04-03 NOTE — Assessment & Plan Note (Signed)
Patient followed by neurology but has not been in some time. Symptoms controlled with medication.  Follow-up with neurology for continued management

## 2014-04-03 NOTE — Assessment & Plan Note (Addendum)
Patient has not been taking Celexa. Discussed importance of continued treatment. Patient states Celexa was not helping much. Her depression is most likely significantly contributing to her poor quality of life. Home stressors do not help in this situation. Family friend states she will most likely have patient stay with her so she is removed from current stressors.  Will switch to Zoloft 50mg  daily.

## 2014-04-06 ENCOUNTER — Ambulatory Visit: Payer: Medicaid Other | Admitting: Neurology

## 2014-04-07 ENCOUNTER — Telehealth: Payer: Self-pay | Admitting: Neurology

## 2014-04-07 NOTE — Telephone Encounter (Signed)
Hi Rachel Bryan, pls see prior notes on her, we discussed to let her know that after 1 more n/s, we will follow office policy. Thanks

## 2014-04-07 NOTE — Telephone Encounter (Signed)
Pt with multiple no shows, most recently an appointment on 04/06/14. How would you like to proceed? / Sherri

## 2014-04-08 ENCOUNTER — Encounter: Payer: Self-pay | Admitting: Neurology

## 2014-04-08 NOTE — Telephone Encounter (Signed)
04/07/14-pt dismissed from [facility] due to repeated no shows per Dr. Delice Lesch. Dismissal letter and paperwork forwarded to HIM for processing / Sherri S.

## 2014-04-13 ENCOUNTER — Ambulatory Visit: Payer: Medicaid Other | Admitting: Family Medicine

## 2014-04-14 ENCOUNTER — Telehealth: Payer: Self-pay | Admitting: Neurology

## 2014-04-14 NOTE — Telephone Encounter (Signed)
Patient dismissed from [facility] by Ellouise Newer, M.D. effective April 08, 2014. Dismissal letter sent out by certified / registered mail April 15, 2014. [place]

## 2014-04-20 NOTE — Telephone Encounter (Signed)
Received signed domestic return receipt verifying delivery of certified letter on April 20, 2014:  Article number [id number] [place]

## 2014-04-23 ENCOUNTER — Telehealth: Payer: Self-pay | Admitting: Family Medicine

## 2014-04-23 DIAGNOSIS — G43109 Migraine with aura, not intractable, without status migrainosus: Secondary | ICD-10-CM

## 2014-04-23 NOTE — Telephone Encounter (Signed)
See note in Appt screen regarding dismissal from [facility]

## 2014-04-27 ENCOUNTER — Encounter (HOSPITAL_BASED_OUTPATIENT_CLINIC_OR_DEPARTMENT_OTHER): Payer: Self-pay | Admitting: Emergency Medicine

## 2014-04-27 ENCOUNTER — Emergency Department (HOSPITAL_BASED_OUTPATIENT_CLINIC_OR_DEPARTMENT_OTHER): Payer: Medicaid Other

## 2014-04-27 ENCOUNTER — Emergency Department (HOSPITAL_BASED_OUTPATIENT_CLINIC_OR_DEPARTMENT_OTHER)
Admission: EM | Admit: 2014-04-27 | Discharge: 2014-04-27 | Disposition: A | Discharge status: Home / Self Care | Payer: Medicaid Other | Attending: Emergency Medicine | Admitting: Emergency Medicine

## 2014-04-27 DIAGNOSIS — Z8669 Personal history of other diseases of the nervous system and sense organs: Secondary | ICD-10-CM | POA: Diagnosis not present

## 2014-04-27 DIAGNOSIS — Z880 Allergy status to penicillin: Secondary | ICD-10-CM | POA: Insufficient documentation

## 2014-04-27 DIAGNOSIS — Z8639 Personal history of other endocrine, nutritional and metabolic disease: Secondary | ICD-10-CM | POA: Diagnosis not present

## 2014-04-27 DIAGNOSIS — R109 Unspecified abdominal pain: Secondary | ICD-10-CM | POA: Diagnosis present

## 2014-04-27 DIAGNOSIS — Z720 Tobacco use: Secondary | ICD-10-CM | POA: Diagnosis not present

## 2014-04-27 DIAGNOSIS — Z8659 Personal history of other mental and behavioral disorders: Secondary | ICD-10-CM | POA: Insufficient documentation

## 2014-04-27 DIAGNOSIS — N201 Calculus of ureter: Secondary | ICD-10-CM | POA: Insufficient documentation

## 2014-04-27 DIAGNOSIS — Z79899 Other long term (current) drug therapy: Secondary | ICD-10-CM | POA: Diagnosis not present

## 2014-04-27 HISTORY — DX: Lactose intolerance, unspecified: E73.9

## 2014-04-27 LAB — URINE MICROSCOPIC-ADD ON

## 2014-04-27 LAB — CBC WITH DIFFERENTIAL/PLATELET
Basophils Absolute: 0.1 10*3/uL (ref 0.0–0.1)
Basophils Relative: 1 % (ref 0–1)
EOS ABS: 0.2 10*3/uL (ref 0.0–0.7)
Eosinophils Relative: 4 % (ref 0–5)
HEMATOCRIT: 39 % (ref 36.0–46.0)
Hemoglobin: 12.4 g/dL (ref 12.0–15.0)
LYMPHS PCT: 26 % (ref 12–46)
Lymphs Abs: 1.7 10*3/uL (ref 0.7–4.0)
MCH: 28.9 pg (ref 26.0–34.0)
MCHC: 31.8 g/dL (ref 30.0–36.0)
MCV: 90.9 fL (ref 78.0–100.0)
Monocytes Absolute: 0.5 10*3/uL (ref 0.1–1.0)
Monocytes Relative: 7 % (ref 3–12)
NEUTROS ABS: 3.9 10*3/uL (ref 1.7–7.7)
Neutrophils Relative %: 62 % (ref 43–77)
Platelets: 254 10*3/uL (ref 150–400)
RBC: 4.29 MIL/uL (ref 3.87–5.11)
RDW: 12.6 % (ref 11.5–15.5)
WBC: 6.4 10*3/uL (ref 4.0–10.5)

## 2014-04-27 LAB — URINALYSIS, ROUTINE W REFLEX MICROSCOPIC
BILIRUBIN URINE: NEGATIVE
GLUCOSE, UA: NEGATIVE mg/dL
Ketones, ur: NEGATIVE mg/dL
Leukocytes, UA: NEGATIVE
Nitrite: NEGATIVE
PROTEIN: NEGATIVE mg/dL
Specific Gravity, Urine: 1.013 (ref 1.005–1.030)
Urobilinogen, UA: 0.2 mg/dL (ref 0.0–1.0)
pH: 6 (ref 5.0–8.0)

## 2014-04-27 LAB — BASIC METABOLIC PANEL
Anion gap: 9 (ref 5–15)
BUN: 14 mg/dL (ref 6–23)
CO2: 23 mmol/L (ref 19–32)
Calcium: 9.5 mg/dL (ref 8.4–10.5)
Chloride: 108 mmol/L (ref 96–112)
Creatinine, Ser: 0.96 mg/dL (ref 0.50–1.10)
GFR, EST AFRICAN AMERICAN: 72 mL/min — AB (ref >90)
GFR, EST NON AFRICAN AMERICAN: 62 mL/min — AB (ref >90)
GLUCOSE: 103 mg/dL — AB (ref 70–99)
POTASSIUM: 3.6 mmol/L (ref 3.5–5.1)
Sodium: 140 mmol/L (ref 135–145)

## 2014-04-27 MED ORDER — SODIUM CHLORIDE 0.9 % IV BOLUS (SEPSIS)
1000.0000 mL | Freq: Once | INTRAVENOUS | Status: AC
  Administered 2014-04-27: 1000 mL via INTRAVENOUS

## 2014-04-27 MED ORDER — OXYCODONE-ACETAMINOPHEN 5-325 MG PO TABS: 1.0000 | ORAL_TABLET | ORAL | Status: DC | PRN

## 2014-04-27 MED ORDER — PROMETHAZINE HCL 25 MG PO TABS: 25.0000 mg | ORAL_TABLET | Freq: Four times a day (QID) | ORAL | Status: DC | PRN

## 2014-04-27 MED ORDER — IOHEXOL 300 MG/ML  SOLN
100.0000 mL | Freq: Once | INTRAMUSCULAR | Status: AC | PRN
  Administered 2014-04-27: 100 mL via INTRAVENOUS

## 2014-04-27 MED ORDER — ONDANSETRON HCL 4 MG/2ML IJ SOLN
4.0000 mg | Freq: Once | INTRAMUSCULAR | Status: AC
  Administered 2014-04-27: 4 mg via INTRAVENOUS
  Filled 2014-04-27: qty 2

## 2014-04-27 MED ORDER — FENTANYL CITRATE 0.05 MG/ML IJ SOLN
50.0000 ug | Freq: Once | INTRAMUSCULAR | Status: AC
  Administered 2014-04-27: 50 ug via INTRAVENOUS
  Filled 2014-04-27: qty 2

## 2014-04-27 MED ORDER — IBUPROFEN 800 MG PO TABS: 800.0000 mg | ORAL_TABLET | Freq: Three times a day (TID) | ORAL | Status: DC

## 2014-04-27 NOTE — ED Notes (Signed)
Patient transported to CT 

## 2014-04-27 NOTE — ED Provider Notes (Signed)
CSN: [id number]     Arrival date & time 04/27/14  1344 History   First MD Initiated Contact with Patient 04/27/14 1345     Chief Complaint  Patient presents with  . Abdominal Pain     (Consider location/radiation/quality/duration/timing/severity/associated sxs/prior Treatment) HPI Comments: 64 year old female, prior history of appendectomy and hysterectomy, presents with left sided abdominal pain which started this morning, has been persistent, was gradual in onset but is gradually worsening and is now severe. It is located on the left mid abdomen near the side, minimal pain in the left lower quadrant, no pain on the right side. She denies nausea vomiting fevers or chills but has had multiple episodes of loose stools this morning. She states "I have been running back and forth to the bathroom"  Patient is a 64 y.o. female presenting with abdominal pain. The history is provided by the patient and the EMS personnel.  Abdominal Pain   Past Medical History  Diagnosis Date  . Cluster headache   . Anxiety   . Insomnia secondary to anxiety   . Incontinence of urine   . Lactose intolerance    Past Surgical History  Procedure Laterality Date  . Appendectomy    . Abdominal hysterectomy     Family History  Problem Relation Age of Onset  . Transient ischemic attack Mother   . Cancer Mother     lung (smoker)  . Anxiety disorder Mother   . Cancer Father     prostate  . Hypertension Father   . Cancer Maternal Grandmother     leukemia   History  Substance Use Topics  . Smoking status: Current Every Day Smoker -- 0.25 packs/day  . Smokeless tobacco: Not on file  . Alcohol Use: No   OB History    No data available     Review of Systems  Gastrointestinal: Positive for abdominal pain.  All other systems reviewed and are negative.     Allergies  Levaquin; Penicillins; Codeine; Demerol; Morphine and related; and Sulfa antibiotics  Home Medications   Prior to Admission  medications   Medication Sig Start Date End Date Taking? Authorizing Provider  ibuprofen (ADVIL,MOTRIN) 800 MG tablet Take 1 tablet (800 mg total) by mouth 3 (three) times daily. 04/27/14   Noemi Chapel, MD  oxyCODONE-acetaminophen (PERCOCET) 5-325 MG per tablet Take 1 tablet by mouth every 4 (four) hours as needed. 04/27/14   Noemi Chapel, MD  promethazine (PHENERGAN) 25 MG tablet Take 1 tablet (25 mg total) by mouth every 6 (six) hours as needed for nausea or vomiting. 04/27/14   Noemi Chapel, MD  sertraline (ZOLOFT) 50 MG tablet Take 1 tablet (50 mg total) by mouth daily. 03/30/14   Mariel Aloe, MD  topiramate (TOPAMAX) 25 MG tablet Take 2 tablets at bedtime for 1 week, then increase to 3 tablets at bedtime for 1 week, then increase to 4 tablets and continue 11/27/13   Cameron Sprang, MD   BP 152/76 mmHg  Pulse 78  Temp(Src) 97.5 F (36.4 C) (Oral)  Resp 16  SpO2 98% Physical Exam  Constitutional: She appears well-developed and well-nourished. No distress.  HENT:  Head: Normocephalic and atraumatic.  Mouth/Throat: Oropharynx is clear and moist. No oropharyngeal exudate.  Eyes: Conjunctivae and EOM are normal. Pupils are equal, round, and reactive to light. Right eye exhibits no discharge. Left eye exhibits no discharge. No scleral icterus.  Neck: Normal range of motion. Neck supple. No JVD present. No thyromegaly present.  Cardiovascular:  Normal rate, regular rhythm, normal heart sounds and intact distal pulses.  Exam reveals no gallop and no friction rub.   No murmur heard. Pulmonary/Chest: Effort normal and breath sounds normal. No respiratory distress. She has no wheezes. She has no rales.  Abdominal: Soft. Bowel sounds are normal. She exhibits no distension and no mass. There is tenderness ( Tender to palpation in the left mid abdomen, mild guarding, no peritoneal signs, no tenderness on the right).  Musculoskeletal: Normal range of motion. She exhibits no edema or tenderness.   Lymphadenopathy:    She has no cervical adenopathy.  Neurological: She is alert. Coordination normal.  Skin: Skin is warm and dry. No rash noted. No erythema.  Psychiatric: She has a normal mood and affect. Her behavior is normal.  Nursing note and vitals reviewed.   ED Course  Procedures (including critical care time) Labs Review Labs Reviewed  BASIC METABOLIC PANEL - Abnormal; Notable for the following:    Glucose, Bld 103 (*)    GFR calc non Af Amer 62 (*)    GFR calc Af Amer 72 (*)    All other components within normal limits  URINALYSIS, ROUTINE W REFLEX MICROSCOPIC - Abnormal; Notable for the following:    APPearance CLOUDY (*)    Hgb urine dipstick LARGE (*)    All other components within normal limits  URINE MICROSCOPIC-ADD ON - Abnormal; Notable for the following:    Squamous Epithelial / LPF FEW (*)    Bacteria, UA FEW (*)    All other components within normal limits  CBC WITH DIFFERENTIAL/PLATELET    Imaging Review Ct Abdomen Pelvis W Contrast  04/27/2014   CLINICAL DATA:  Diffuse abdominal pain and tenderness, bloating, frequent urination, incontinence, history anxiety, appendectomy, hysterectomy  EXAM: CT ABDOMEN AND PELVIS WITH CONTRAST  TECHNIQUE: Multidetector CT imaging of the abdomen and pelvis was performed using the standard protocol following bolus administration of intravenous contrast.  CONTRAST:  114mL OMNIPAQUE IOHEXOL 300 MG/ML SOLN IV. No oral contrast was administered.  COMPARISON:  03/09/2006, 01/19/2012  FINDINGS: Dependent density in both lower lobes.  Tiny nonobstructing RIGHT renal calculi.  6 mm diameter intermediate attenuation nodule inferior pole LEFT kidney image 25 series 7.  LEFT hydronephrosis and hydroureter secondary to a 2-3 mm LEFT ureterovesical junction calculus.  Mild delay in LEFT nephrogram versus RIGHT.  Tiny hepatic cysts with calcified 9 mm gallstone within gallbladder.  Liver, spleen, pancreas, kidneys, and adrenal glands otherwise  normal.  Scattered atherosclerotic calcifications aorta.  Prominent fat at ileocecal valve with fluid filled cecum, unchanged.  Stomach and bowel loops otherwise unremarkable for exam lacking GI contrast.  No mass, adenopathy, free air or free fluid.  Degenerative disc disease changes L4-L5.  IMPRESSION: LEFT hydronephrosis and hydroureter secondary to a 2-3 mm diameter LEFT UVJ calculus with associated mild delay in LEFT nephrogram versus RIGHT.  Nonobstructing RIGHT renal calculi.  Cholelithiasis.  Indeterminate 6 mm nodule inferior pole LEFT kidney corresponding to a 6 mm intermediate attenuation nodule on the 2013 exam suspect stable tiny hyperdense LEFT renal cyst.   Electronically Signed   By: Lavonia Dana M.D.   On: 04/27/2014 14:35      MDM   Final diagnoses:  Abdominal pain  Left ureteral stone    The patient appears to be uncomfortable, she does not have cough pain, no peritoneal signs, possible diverticulitis, possible colitis, mild diarrheal illness, she states this is nonbloody, non-watery but loose stools. Vital signs are unremarkable and  reassuring, will give fluids, pain medication, labs and CT scan.  CT scan reveals left distal ureteral kidney stone. Urinalysis shows hematuria, basic metabolic panel shows preserved renal function.  The patient was informed of her kidney stone, I informed her of the renal cyst, she will need close follow-up, vital signs are normal, symptoms are much improved after medications, prescriptions will be given as below.   Meds given in ED:  Medications  ondansetron (ZOFRAN) injection 4 mg (4 mg Intravenous Given 04/27/14 1413)  fentaNYL (SUBLIMAZE) injection 50 mcg (50 mcg Intravenous Given 04/27/14 1413)  sodium chloride 0.9 % bolus 1,000 mL (1,000 mLs Intravenous New Bag/Given 04/27/14 1413)  iohexol (OMNIPAQUE) 300 MG/ML solution 100 mL (100 mLs Intravenous Contrast Given 04/27/14 1416)    New Prescriptions   IBUPROFEN (ADVIL,MOTRIN) 800 MG TABLET     Take 1 tablet (800 mg total) by mouth 3 (three) times daily.   OXYCODONE-ACETAMINOPHEN (PERCOCET) 5-325 MG PER TABLET    Take 1 tablet by mouth every 4 (four) hours as needed.   PROMETHAZINE (PHENERGAN) 25 MG TABLET    Take 1 tablet (25 mg total) by mouth every 6 (six) hours as needed for nausea or vomiting.      Noemi Chapel, MD 04/27/14 1537

## 2014-04-27 NOTE — Discharge Instructions (Signed)
Please call your doctor for a followup appointment within 24-48 hours. When you talk to your doctor please let them know that you were seen in the emergency department and have them acquire all of your records so that they can discuss the findings with you and formulate a treatment plan to fully care for your new and ongoing problems.  Your exam and or your xrays have shown that you likely have a kidney stone.  You should follow up with the Urologist of your choosing or the Urologist listed above in the next 2-3 days if you have not passed the stone.  You should urinate in to the strainer until you pass the stone.    Flomax helps with passing the stone by opening up the Ureters (tubes), Vicodin and an antiinflammatory for pain if you are not allergic to these medicines.  Phenergan or Zofran for nausea.  Return to the ER for severe or worsening pain, vomiting or fevers or if you are unable to control your pain with the medicines provided.  Kidney Stones Kidney stones (ureteral lithiasis) are deposits that form inside your kidneys. The intense pain is caused by the stone moving through the urinary tract. When the stone moves, the ureter goes into spasm around the stone. The stone is usually passed in the urine.  CAUSES   A disorder that makes certain neck glands produce too much parathyroid hormone (primary hyperparathyroidism).   A buildup of uric acid crystals.   Narrowing (stricture) of the ureter.   A kidney obstruction present at birth (congenital obstruction).   Previous surgery on the kidney or ureters.   Numerous kidney infections.  SYMPTOMS   Feeling sick to your stomach (nauseous).   Throwing up (vomiting).   Blood in the urine (hematuria).   Pain that usually spreads (radiates) to the groin.   Frequency or urgency of urination.  DIAGNOSIS   Taking a history and physical exam.   Blood or urine tests.   Computerized X-ray scan (CT scan).   Occasionally, an examination  of the inside of the urinary bladder (cystoscopy) is performed.  TREATMENT   Observation.   Increasing your fluid intake.   Surgery may be needed if you have severe pain or persistent obstruction.  The size, location, and chemical composition are all important variables that will determine the proper choice of action for you. Talk to your caregiver to better understand your situation so that you will minimize the risk of injury to yourself and your kidney.  HOME CARE INSTRUCTIONS   Drink enough water and fluids to keep your urine clear or pale yellow.   Strain all urine through the provided strainer. Keep all particulate matter and stones for your caregiver to see. The stone causing the pain may be as small as a grain of salt. It is very important to use the strainer each and every time you pass your urine. The collection of your stone will allow your caregiver to analyze it and verify that a stone has actually passed.   Only take over-the-counter or prescription medicines for pain, discomfort, or fever as directed by your caregiver.   Make a follow-up appointment with your caregiver as directed.   Get follow-up X-rays if required. The absence of pain does not always mean that the stone has passed. It may have only stopped moving. If the urine remains completely obstructed, it can cause loss of kidney function or even complete destruction of the kidney. It is your responsibility to make sure  X-rays and follow-ups are completed. Ultrasounds of the kidney can show blockages and the status of the kidney. Ultrasounds are not associated with any radiation and can be performed easily in a matter of minutes.  SEEK IMMEDIATE MEDICAL CARE IF:   Pain cannot be controlled with the prescribed medicine.   You have a fever.   The severity or intensity of pain increases over 18 hours and is not relieved by pain medicine.   You develop a new onset of abdominal pain.   You feel faint or pass out.  MAKE  SURE YOU:   Understand these instructions.   Will watch your condition.   Will get help right away if you are not doing well or get worse.  Document Released: 01/09/2005 Document Revised: 12/29/2010 Document Reviewed: 05/07/2009 [facility] Patient Information 2012 [place].  RESOURCE GUIDE  Chronic Pain Problems: Contact [facility]  [phone] Patients need to be referred by their primary care doctor.  Insufficient Money for Medicine: Contact United Way:  call "211" or [place] [phone].  No Primary Care Doctor: - Call [facility]  [phone] - can help you locate a primary care doctor that  accepts your insurance, provides certain services, etc. - Physician Referral Service- [phone]  Agencies that provide inexpensive medical care: - [facility]  [facility]  [phone] - [facility]  [phone] - [facility]  [phone] - [facility]  [phone] - [facility]  [phone]  [place] Providers: - [facility]- [address]  [phone], Mon-Fri 9am-7pm, Sat 9am-1pm - [place], [address] [place]  [place], [address] [place]  [place]- [address]  [place], [address], [phone]  Only accepts [place] patients after they have their name  applied to their card  Self Pay (no insurance) in [place]: - Sickle Cell Patients: Dr Kevan Ny, [facility]  [place], [facility]- [place]  [facility]- [address], [facility]- see information above (Speak to [employer] if you do not have insurance)        -  [facility]- [place], [facility] [place],  [place], [place]  Dr Vista Lawman-  [address], [place], [facility]- [address], [phone]       -  [facility]- [address], [place], also [address] [phone]       -    [facility]- [address], [place], 1st & 3rd Saturday   every month, 10am-1pm  1) Find a Doctor and Pay Out of Pocket Although you won't have to find out who is covered by your insurance plan, it is a good idea to ask around  and get recommendations. You will then need to call the office and see if the doctor you have chosen will accept you as a new patient and what types of options they offer for patients who are self-pay. Some doctors offer discounts or will set up payment plans for their patients who do not have insurance, but you will need to ask so you aren't surprised when you get to your appointment.  2) Contact Your Local Health Department Not all health departments have doctors that can see patients for sick visits, but many do, so it is worth a call to see if yours does. If you don't know where your local health department is, you can check in your phone book. The CDC also has a tool to help you locate your state's health department, and many state websites also have listings of all of their local health departments.  3) Find a [facility] If your illness is not likely to be very severe or complicated, you may want to try a walk in clinic. These are popping up all over the country in pharmacies, drugstores, and shopping centers. They're usually staffed by nurse practitioners or physician assistants that have been trained to treat common illnesses and complaints. They're usually fairly quick and inexpensive. However, if you have serious medical issues or  chronic medical problems, these are probably not your best option  STD Testing - [place], [facility], [address], [place], phone [phone] or [phone].  Monday - Friday, call for an appointment. - [place], [facility], [address], [place], phone [phone] or [phone].  Monday - Friday, call for an appointment.  Abuse/Neglect: - [place] [phone] - [place] [phone] (After Hours)  Emergency Shelter:  [facility] [phone]  Maternity Homes: - [name] [phone] - [place] [phone]  MRSA Hotline #:   (386)764-4800  [facility] [place] Dept. [address] [place] Phone:  [phone]                                  Phone:  [phone]                   Phone:  [place], [place] in [place], [address],  [phone], [place] [phone] or [phone] (After Hours)   [place]  Substance Abuse Resources: - Alcohol and Drug Services  [phone] - [place] [phone] - [facility] [place] [phone] - Residential & Outpatient Substance Abuse Program  [phone]  Psychological Services: - [place]  [place], [place] [address] [place], [place]: [phone] or [phone], [email or web address]  Dental Assistance  If unable to pay or uninsured, contact:  [facility] or [facility]. to become qualified for the adult dental clinic.  Patients with Medicaid: [facility] [phone] [address], [place] [address], [phone]  If unable to pay, or uninsured, contact [facility] [phone]) or [place] [phone] in [place], [place] in [facility]) to become qualified for the adult dental clinic  Other [place]- [place], [place], [place], [address], [place], [place], 2nd and 4th Thursday of the month at 6:30am.  10 clients each day by appointment, can sometimes see walk-in patients if someone does not show for an appointment. [facility]- [address] [place], [place], [address], [place], [place], [place], [address], [place] Department- [place] Department- [place] Department- [phone]

## 2014-05-19 ENCOUNTER — Ambulatory Visit: Payer: Medicaid Other | Admitting: Family Medicine

## 2014-06-01 ENCOUNTER — Ambulatory Visit: Payer: Self-pay | Admitting: Neurology

## 2014-06-01 ENCOUNTER — Telehealth: Payer: Self-pay

## 2014-06-01 NOTE — Telephone Encounter (Signed)
Patient did not come to a new patient appointment.

## 2014-06-05 ENCOUNTER — Encounter: Payer: Self-pay | Admitting: Neurology

## 2014-06-05 ENCOUNTER — Ambulatory Visit (INDEPENDENT_AMBULATORY_CARE_PROVIDER_SITE_OTHER): Payer: Medicaid Other | Admitting: Neurology

## 2014-06-05 VITALS — BP 126/73 | HR 78 | Ht 67.0 in | Wt 134.4 lb

## 2014-06-05 DIAGNOSIS — R519 Headache, unspecified: Secondary | ICD-10-CM

## 2014-06-05 DIAGNOSIS — R251 Tremor, unspecified: Secondary | ICD-10-CM | POA: Diagnosis not present

## 2014-06-05 DIAGNOSIS — R269 Unspecified abnormalities of gait and mobility: Secondary | ICD-10-CM | POA: Diagnosis not present

## 2014-06-05 DIAGNOSIS — R413 Other amnesia: Secondary | ICD-10-CM | POA: Diagnosis not present

## 2014-06-05 DIAGNOSIS — R51 Headache: Secondary | ICD-10-CM

## 2014-06-05 DIAGNOSIS — R202 Paresthesia of skin: Secondary | ICD-10-CM

## 2014-06-05 HISTORY — DX: Other amnesia: R41.3

## 2014-06-05 HISTORY — DX: Unspecified abnormalities of gait and mobility: R26.9

## 2014-06-05 NOTE — Progress Notes (Signed)
Reason for visit: Possible seizures  Referring physician: Dr. Mellody Rachel Bryan is a 64 y.o. female  History of present illness:  Rachel Bryan is a 64 year old right-handed white female with multiple somatic complaints. The patient last had MRI brain evaluation in August 2015 for right-sided weakness that has persisted. The MRI was normal at that time. The patient indicates that she began using a cane about one year ago, before the right-sided weakness began. The patient has returned to this office with multiple complaints indicating that she began having some difficulty with memory that began 6 or 7 months ago. The patient has difficulty with word finding, and she has difficulty keeping up with medications and appointments. Her boyfriend has to manage the finances. The patient does not operate a motor vehicle. The patient began having episodes of shaking one month ago. The episodes last 2 or 3 minutes, and are associated with rolling back of the eyes, decreased responsiveness. The episodes tend to occur when the patient becomes upset about something. The patient also has headaches that also occur when she gets upset occurring 2 or 3 times a month on average. The headaches are on top of the head sometimes associated with nausea, the headaches may last all night long. She denies any visual complaints with the headache. The patient has been placed on Topamax working up to 100 mg at night. It is not clear that this medication has helped the seizure-type events. The patient also has developed some numbness involving the right hand that began 3 weeks ago. She is concerned about this issue as well. She denies neck pain or low back pain or pain down arms or legs. She does have some urinary incontinence issues, but no incontinence of bowel. She is sent to this office for an evaluation.  Past Medical History  Diagnosis Date  . Cluster headache   . Anxiety   . Insomnia secondary to anxiety   .  Incontinence of urine   . Lactose intolerance   . Memory difficulties 06/05/2014  . Abnormality of gait 06/05/2014    Past Surgical History  Procedure Laterality Date  . Appendectomy    . Abdominal hysterectomy      Family History  Problem Relation Age of Onset  . Transient ischemic attack Mother   . Cancer Mother     lung (smoker)  . Anxiety disorder Mother   . Cancer Father     prostate  . Hypertension Father   . Cancer Maternal Grandmother     leukemia  . Mental retardation Sister   . Healthy Brother   . Healthy Brother     Social history:  reports that she has been smoking.  She does not have any smokeless tobacco history on file. She reports that she does not drink alcohol or use illicit drugs.  Medications:  Prior to Admission medications   Medication Sig Start Date End Date Taking? Authorizing Provider  promethazine (PHENERGAN) 25 MG tablet Take 1 tablet (25 mg total) by mouth every 6 (six) hours as needed for nausea or vomiting. 04/27/14  Yes Noemi Chapel, MD  topiramate (TOPAMAX) 25 MG tablet Take 2 tablets at bedtime for 1 week, then increase to 3 tablets at bedtime for 1 week, then increase to 4 tablets and continue 11/27/13  Yes Cameron Sprang, MD      Allergies  Allergen Reactions  . Levaquin [Levofloxacin] Other (See Comments)    Per pt report.  Burning veins   .  Penicillins Palpitations  . Codeine Hives and Other (See Comments)    sweating  . Demerol [Meperidine] Hives  . Morphine And Related Other (See Comments)    Hallucinations   . Sulfa Antibiotics Hives    ROS:  Out of a complete 14 system review of symptoms, the patient complains only of the following symptoms, and all other reviewed systems are negative.  Palpitations of the heart Ringing in the ears Muscle cramps Memory loss, headache, numbness, slurred speech, seizures Not enough sleep, change in appetite Sleepiness   Blood pressure 126/73, pulse 78, height 5\' 7"  (1.702 m), weight  134 lb 6.4 oz (60.963 kg).  Physical Exam  General: The patient is alert and cooperative at the time of the examination.  Eyes: Pupils are equal, round, and reactive to light. Discs are flat bilaterally.  Neck: The neck is supple, no carotid bruits are noted.  Respiratory: The respiratory examination is clear.  Cardiovascular: The cardiovascular examination reveals a regular rate and rhythm, no obvious murmurs or rubs are noted.  Skin: Extremities are without significant edema.  Neurologic Exam  Mental status: The patient is alert and oriented x 3 at the time of the examination. The patient has apparent normal recent and remote memory, with an apparently normal attention span and concentration ability.Mini-Mental status examination done today shows a total score 26/30.  Cranial nerves: Facial symmetry is present. There is good sensation of the face to pinprick and soft touch bilaterally. The strength of the facial muscles and the muscles to head turning and shoulder shrug are normal bilaterally. Speech is well enunciated, no aphasia or dysarthria is noted.  intermittently, the patient will have a stuttering type speech pattern that will come and go. Extraocular movements are full. Visual fields are full. The tongue is midline, and the patient has symmetric elevation of the soft palate. No obvious hearing deficits are noted.  Motor: The motor testing reveals 5 over 5 strength of all 4 extremities.The patient has giveaway weakness with the right leg, no true weakness.  Good symmetric motor tone is noted throughout.  Sensory: Sensory testing is intact to pinprick, soft touch, vibration sensation, and position sense on all 4 extremities, With exception that there is some decrease in position sense in both hands all. No evidence of extinction is noted.  Coordination: Cerebellar testing reveals good finger-nose-finger and heel-to-shin bilaterally.  Gait and station: Gait is associated with a  wide-based stance, the patient usually walks with a cane. Tandem gait is somewhat unsteady.  Romberg is negative. No drift is seen.  Reflexes: Deep tendon reflexes are symmetric, but are depressed  bilaterally. Toes are downgoing bilaterally.   MRI brain 08/28/13:  IMPRESSION: No acute intracranial abnormality or mass.  * MRI scan images were reviewed online. I agree with the written report.    Assessment/Plan:  1. Reported memory disturbance   2. Reported episodes of trembling, possible seizures   3. Headache   4. Gait disturbance   5. Right hand numbness   6. Nonorganic clinical examination  The patient has multiple complaints. Clinical examination shows giveaway weakness and intermittent stuttering speech suggesting a psychogenic component to the examination. The patient will be sent for MRI evaluation, and she will undergo blood work today. Nerve conduction studies will be done on both arms, EMG on the right arm to evaluate the right hand and arm paresthesias. The patient will have an EEG study. The patient is on Topamax, and she will continue this medication for now. If  the above workup is unremarkable, I will not pursue any further neurologic evaluation. The patient has been discharged from [facility] neurology previously because of multiple no shows.   Jill Alexanders MD 06/06/2014 6:16 PM  [facility] [address] [place], [place] [address]  Phone [phone] Fax [phone]

## 2014-06-05 NOTE — Patient Instructions (Signed)

## 2014-06-08 ENCOUNTER — Other Ambulatory Visit: Payer: Self-pay | Admitting: *Deleted

## 2014-06-08 MED ORDER — SERTRALINE HCL 50 MG PO TABS: 50.0000 mg | ORAL_TABLET | Freq: Every day | ORAL | Status: DC

## 2014-06-09 ENCOUNTER — Telehealth: Payer: Self-pay | Admitting: Neurology

## 2014-06-09 LAB — METHYLMALONIC ACID, SERUM: METHYLMALONIC ACID: 378 nmol/L (ref 0–378)

## 2014-06-09 LAB — RPR: RPR: NONREACTIVE

## 2014-06-09 LAB — VITAMIN B12: Vitamin B-12: 169 pg/mL — ABNORMAL LOW (ref 211–946)

## 2014-06-09 LAB — HIV ANTIBODY (ROUTINE TESTING W REFLEX): HIV Screen 4th Generation wRfx: NONREACTIVE

## 2014-06-09 LAB — COPPER, SERUM: Copper: 121 ug/dL (ref 72–166)

## 2014-06-09 NOTE — Telephone Encounter (Signed)
I called the patient. The blood work was unremarkable with exception that the vitamin B12 level was low. I'll have the patient come in to get a vitamin B12 shot, and then go on vitamin  B12 tablets taking 1000 g daily.

## 2014-06-11 ENCOUNTER — Ambulatory Visit (INDEPENDENT_AMBULATORY_CARE_PROVIDER_SITE_OTHER): Payer: Medicaid Other | Admitting: Neurology

## 2014-06-11 ENCOUNTER — Ambulatory Visit (INDEPENDENT_AMBULATORY_CARE_PROVIDER_SITE_OTHER): Payer: Medicaid Other

## 2014-06-11 ENCOUNTER — Encounter: Payer: Self-pay | Admitting: Neurology

## 2014-06-11 DIAGNOSIS — R413 Other amnesia: Secondary | ICD-10-CM

## 2014-06-11 DIAGNOSIS — R51 Headache: Secondary | ICD-10-CM

## 2014-06-11 DIAGNOSIS — R519 Headache, unspecified: Secondary | ICD-10-CM

## 2014-06-11 DIAGNOSIS — E538 Deficiency of other specified B group vitamins: Secondary | ICD-10-CM

## 2014-06-11 DIAGNOSIS — R251 Tremor, unspecified: Secondary | ICD-10-CM | POA: Diagnosis not present

## 2014-06-11 DIAGNOSIS — R269 Unspecified abnormalities of gait and mobility: Secondary | ICD-10-CM

## 2014-06-11 MED ORDER — CYANOCOBALAMIN 1000 MCG/ML IJ SOLN
1000.0000 ug | Freq: Once | INTRAMUSCULAR | Status: AC
  Administered 2014-06-11: 1000 ug via INTRAMUSCULAR

## 2014-06-11 NOTE — Progress Notes (Signed)
I called the patient. The EEG study was normal. MRI evaluation of the brain and nerve conduction EMG studies are scheduled to be done. I discussed the results of the EEG with the patient.

## 2014-06-11 NOTE — Procedures (Signed)
    History:  Rachel Bryan is a 64 year old patient with a history of chronic right-sided weakness and some difficulty with memory beginning about 6 or 7 months prior to this evaluation. He began having episodes of shaking with eyes rolling back one month ago with decreased responsiveness. The patient is being evaluated for possible seizures.  This is a routine EEG. No skull defects are noted. Medications include promethazine, Zoloft, Topamax, and vitamin B12.   EEG classification: Normal awake and drowsy  Description of the recording: The background rhythms of this recording consists of a fairly well modulated medium amplitude alpha rhythm of 9 Hz that is reactive to eye opening and closure. As the record progresses, the patient appears to remain in the waking state throughout the recording. Photic stimulation was performed, resulting in a bilateral and symmetric photic driving response. Hyperventilation was also performed, resulting in a minimal buildup of the background rhythm activities without significant slowing seen. Toward the end of the recording, the patient enters the drowsy state with slight symmetric slowing seen. The patient never enters stage II sleep. At no time during the recording does there appear to be evidence of spike or spike wave discharges or evidence of focal slowing. EKG monitor shows no evidence of cardiac rhythm abnormalities with a heart rate of 78.  Impression: This is a normal EEG recording in the waking and drowsy state. No evidence of ictal or interictal discharges are seen.

## 2014-06-13 ENCOUNTER — Inpatient Hospital Stay: Admission: RE | Admit: 2014-06-13 | Payer: Medicaid Other | Source: Ambulatory Visit

## 2014-06-15 ENCOUNTER — Other Ambulatory Visit: Payer: Self-pay | Admitting: *Deleted

## 2014-06-19 ENCOUNTER — Encounter: Payer: Medicaid Other | Admitting: Neurology

## 2014-06-25 ENCOUNTER — Inpatient Hospital Stay: Admission: RE | Admit: 2014-06-25 | Payer: Medicaid Other | Source: Ambulatory Visit

## 2014-06-29 ENCOUNTER — Ambulatory Visit (INDEPENDENT_AMBULATORY_CARE_PROVIDER_SITE_OTHER): Payer: Medicaid Other | Admitting: Family Medicine

## 2014-06-29 ENCOUNTER — Encounter: Payer: Self-pay | Admitting: Family Medicine

## 2014-06-29 VITALS — BP 134/63 | HR 81 | Temp 98.1°F | Wt 129.0 lb

## 2014-06-29 DIAGNOSIS — F329 Major depressive disorder, single episode, unspecified: Secondary | ICD-10-CM

## 2014-06-29 DIAGNOSIS — J209 Acute bronchitis, unspecified: Secondary | ICD-10-CM

## 2014-06-29 DIAGNOSIS — F32A Depression, unspecified: Secondary | ICD-10-CM

## 2014-06-29 DIAGNOSIS — I889 Nonspecific lymphadenitis, unspecified: Secondary | ICD-10-CM | POA: Diagnosis not present

## 2014-06-29 MED ORDER — SERTRALINE HCL 100 MG PO TABS: 50.0000 mg | ORAL_TABLET | Freq: Every day | ORAL | Status: DC

## 2014-06-29 MED ORDER — ISOSORBIDE MONONITRATE ER 30 MG PO TB24: 30.0000 mg | ORAL_TABLET | Freq: Every day | ORAL | Status: DC

## 2014-06-29 MED ORDER — DOXYCYCLINE HYCLATE 100 MG PO TABS: 100.0000 mg | ORAL_TABLET | Freq: Two times a day (BID) | ORAL | Status: DC

## 2014-06-29 MED ORDER — CARVEDILOL 6.25 MG PO TABS: 6.2500 mg | ORAL_TABLET | Freq: Two times a day (BID) | ORAL | Status: DC

## 2014-06-29 NOTE — Progress Notes (Signed)
    Subjective    Rachel Bryan is a 64 y.o. female that presents for a follow-up visit for chronic issues.   1. Coughing: Symptoms started two weeks ago. Symptoms include sore throat, productive cough with dark green sputum. No fevers, chills, shortness of breath, nausea or emesis. She has associated diarrhea. Coughing worse at night time. She has taken Nyquil, Tussin, Chlroseden. She has multiple sick contacts in her home.   2. Depression: She has been taking Zoloft daily. She states that she has good days and bad days. She has decreased appetite and has lost some weight because of that. She does endorse some crying spells.    History  Substance Use Topics  . Smoking status: Current Every Day Smoker -- 1.00 packs/day  . Smokeless tobacco: Not on file  . Alcohol Use: No    Allergies  Allergen Reactions  . Levaquin [Levofloxacin] Other (See Comments)    Per pt report.  Burning veins   . Penicillins Palpitations  . Codeine Hives and Other (See Comments)    sweating  . Demerol [Meperidine] Hives  . Morphine And Related Other (See Comments)    Hallucinations   . Sulfa Antibiotics Hives    Meds ordered this encounter  Medications  . DISCONTD: carvedilol (COREG) 6.25 MG tablet    Sig: Take 1 tablet (6.25 mg total) by mouth 2 (two) times daily with a meal.    Dispense:  60 tablet    Refill:  1  . DISCONTD: isosorbide mononitrate (IMDUR) 30 MG 24 hr tablet    Sig: Take 1 tablet (30 mg total) by mouth daily.    Dispense:  30 tablet    Refill:  1  . doxycycline (VIBRA-TABS) 100 MG tablet    Sig: Take 1 tablet (100 mg total) by mouth 2 (two) times daily.    Dispense:  20 tablet    Refill:  0  . sertraline (ZOLOFT) 100 MG tablet    Sig: Take 0.5 tablets (50 mg total) by mouth daily.    Dispense:  30 tablet    Refill:  2    ROS  Per HPI   Objective   BP 134/63 mmHg  Pulse 81  Temp(Src) 98.1 F (36.7 C) (Oral)  Wt 129 lb (58.514 kg)  General: Fair appearing, no  distress HEENT: RIght, large, tender, submandibular lymphadenitis present. No edema or erythema of floor of oropharynx. No oral exudates or erythema.  Neuro: Alert, oriented Psych: flat affect, normal speech, no SI  Assessment and Plan   Please refer to problem based charting of assessment and plan

## 2014-06-29 NOTE — Patient Instructions (Signed)
Thank you for coming to see me today. It was a pleasure. Today we talked about:   Cough/sore throat: I will treat you with antibiotics for 10 days.  Depression. I will increase your Zoloft to 100mg  daily  Please make an appointment to see me in 3-4 weeks for follow-up.  If you have any questions or concerns, please do not hesitate to call the office at [phone].  Sincerely,  Cordelia Poche, MD

## 2014-07-09 ENCOUNTER — Encounter: Payer: Self-pay | Admitting: Neurology

## 2014-07-09 ENCOUNTER — Encounter: Payer: Medicaid Other | Admitting: Neurology

## 2014-07-09 ENCOUNTER — Inpatient Hospital Stay: Admission: RE | Admit: 2014-07-09 | Payer: Medicaid Other | Source: Ambulatory Visit

## 2014-07-09 ENCOUNTER — Telehealth: Payer: Self-pay

## 2014-07-09 NOTE — Telephone Encounter (Signed)
Patient has had 3 no shows. (06/01/14; 06/19/14; 07/09/14)

## 2014-07-09 NOTE — Telephone Encounter (Signed)
Patient did not come to EMG/NCV appointment.

## 2014-07-10 DIAGNOSIS — J209 Acute bronchitis, unspecified: Secondary | ICD-10-CM | POA: Insufficient documentation

## 2014-07-10 NOTE — Assessment & Plan Note (Signed)
Will treat with doxycycline 100mg  x10 days

## 2014-07-10 NOTE — Assessment & Plan Note (Signed)
Patient appears to be somewhat improved but not much. Appetite seems to be an issue and will have to watch weights carefully. Will increase Zoloft to 100mg  but may benefit from switch to Remeron. Will continue to monitor response.

## 2014-07-16 ENCOUNTER — Other Ambulatory Visit: Payer: Self-pay | Admitting: Neurology

## 2014-07-17 ENCOUNTER — Other Ambulatory Visit: Payer: Self-pay | Admitting: *Deleted

## 2014-07-28 ENCOUNTER — Ambulatory Visit: Payer: Medicaid Other | Admitting: Family Medicine

## 2014-07-30 ENCOUNTER — Other Ambulatory Visit: Payer: Self-pay | Admitting: Neurology

## 2014-09-08 ENCOUNTER — Ambulatory Visit (INDEPENDENT_AMBULATORY_CARE_PROVIDER_SITE_OTHER): Payer: Medicaid Other | Admitting: Family Medicine

## 2014-09-08 VITALS — BP 121/58 | HR 67 | Temp 97.8°F | Ht 67.0 in | Wt 132.7 lb

## 2014-09-08 DIAGNOSIS — R252 Cramp and spasm: Secondary | ICD-10-CM | POA: Diagnosis not present

## 2014-09-08 DIAGNOSIS — R269 Unspecified abnormalities of gait and mobility: Secondary | ICD-10-CM | POA: Diagnosis not present

## 2014-09-08 DIAGNOSIS — L659 Nonscarring hair loss, unspecified: Secondary | ICD-10-CM | POA: Insufficient documentation

## 2014-09-08 DIAGNOSIS — R51 Headache: Secondary | ICD-10-CM

## 2014-09-08 DIAGNOSIS — R413 Other amnesia: Secondary | ICD-10-CM

## 2014-09-08 DIAGNOSIS — R519 Headache, unspecified: Secondary | ICD-10-CM | POA: Insufficient documentation

## 2014-09-08 MED ORDER — MINOXIDIL 2 % EX SOLN: Freq: Two times a day (BID) | CUTANEOUS | Status: DC

## 2014-09-08 MED ORDER — SERTRALINE HCL 100 MG PO TABS: 50.0000 mg | ORAL_TABLET | Freq: Every day | ORAL | Status: DC

## 2014-09-08 MED ORDER — TOPIRAMATE 25 MG PO TABS: ORAL_TABLET | ORAL | Status: DC

## 2014-09-08 MED ORDER — BACLOFEN 10 MG PO TABS: 10.0000 mg | ORAL_TABLET | Freq: Three times a day (TID) | ORAL | Status: DC

## 2014-09-08 MED ORDER — ATTENDS BREATHABLE BRIEFS MED MISC
1.0000 | Freq: Every day | Status: DC | PRN
Start: 2014-09-08 — End: 2020-11-12

## 2014-09-08 NOTE — Patient Instructions (Signed)
Thank you for coming to see me today. It was a pleasure. Today we talked about:   Cramps: I am prescribing Baclofen which is a muscle relaxer. You can also use heat for the cramps. I am getting labs  Headache: refill Topamax  Depression: refill Zoloft  Incontinence: briefs prescribed  Memory: make an appointment for the geriatric clinic  Balance, we will discuss this at our next visit  Please make an appointment to see me in 1 month for follow-up .  If you have any questions or concerns, please do not hesitate to call the office at [phone].  Sincerely,  Cordelia Poche, MD

## 2014-09-08 NOTE — Progress Notes (Signed)
    Subjective    Rachel Bryan is a 64 y.o. female that presents for a follow-up visit for chronic issues.   1. Hair loss: Chronic. She has taken biotin for her hair, which did not help. She notices hair in her brush a lot.   2. Staggering: Chronic issue. Sometimes she feels lightheaded. She also states she has symptoms of vertigo with the room turning around. Symptoms usually occuring every day. No falls. She was seeing neurology prior, but no longer is a patient.  3. Hand cramps: started about one year ago. Happens a lot when she is writing. Happens more in her right hands but also occurs in left hand and feet. These episodes are very painful and happen a few times a week.  4. Headaches: She is having them once every three to four days. Topamax helps with symptoms. She is not going to neurology for management anymore.  Social History  Substance Use Topics  . Smoking status: Current Every Day Smoker -- 1.00 packs/day  . Smokeless tobacco: Not on file  . Alcohol Use: No    Allergies  Allergen Reactions  . Levaquin [Levofloxacin] Other (See Comments)    Per pt report.  Burning veins   . Penicillins Palpitations  . Codeine Hives and Other (See Comments)    sweating  . Demerol [Meperidine] Hives  . Morphine And Related Other (See Comments)    Hallucinations   . Sulfa Antibiotics Hives    No orders of the defined types were placed in this encounter.    ROS  Per HPI   Objective   BP 121/58 mmHg  Pulse 67  Temp(Src) 97.8 F (36.6 C) (Oral)  Ht 5\' 7"  (1.702 m)  Wt 132 lb 11.2 oz (60.192 kg)  BMI 20.78 kg/m2  General: Fair appearing, no distress Extremities: Right hand with normal muscle bulk, digits with normal strength. During exam, patient had a serious muscle cramp in her right hand that appeared very painful. Dr. Ree Kida helped massage cramp out of patient. Sensation intact  Assessment and Plan   Please refer to problem based charting of assessment and plan

## 2014-09-08 NOTE — Assessment & Plan Note (Signed)
Try minoxidil 2% solution

## 2014-09-08 NOTE — Assessment & Plan Note (Signed)
-

## 2014-09-08 NOTE — Assessment & Plan Note (Signed)
Recommend follow-up in geriatrics clinic

## 2014-09-08 NOTE — Assessment & Plan Note (Signed)
Baclofen 10mg  TID Heat Prn May need referral to PM&R for management

## 2014-09-08 NOTE — Assessment & Plan Note (Signed)
Follow-up in Geriatrics clinic

## 2014-09-09 ENCOUNTER — Telehealth: Payer: Self-pay | Admitting: *Deleted

## 2014-09-09 LAB — COMPLETE METABOLIC PANEL WITH GFR
ALBUMIN: 4 g/dL (ref 3.6–5.1)
ALT: 8 U/L (ref 6–29)
AST: 11 U/L (ref 10–35)
Alkaline Phosphatase: 56 U/L (ref 33–130)
BILIRUBIN TOTAL: 0.4 mg/dL (ref 0.2–1.2)
BUN: 16 mg/dL (ref 7–25)
CALCIUM: 9.5 mg/dL (ref 8.6–10.4)
CO2: 24 mmol/L (ref 20–31)
Chloride: 107 mmol/L (ref 98–110)
Creat: 0.88 mg/dL (ref 0.50–0.99)
GFR, EST AFRICAN AMERICAN: 81 mL/min (ref >=60)
GFR, Est Non African American: 71 mL/min (ref >=60)
Glucose, Bld: 82 mg/dL (ref 65–99)
POTASSIUM: 3.9 mmol/L (ref 3.5–5.3)
Sodium: 140 mmol/L (ref 135–146)
TOTAL PROTEIN: 7 g/dL (ref 6.1–8.1)

## 2014-09-09 LAB — MAGNESIUM: MAGNESIUM: 2.1 mg/dL (ref 1.5–2.5)

## 2014-09-09 MED ORDER — BACLOFEN 10 MG PO TABS: 10.0000 mg | ORAL_TABLET | Freq: Three times a day (TID) | ORAL | Status: DC

## 2014-09-09 NOTE — Telephone Encounter (Signed)
Patient called this morning stating she can not afford to pay over $100 for Baclofen at [facility] and requested Rx be sent to [facility] for a cheaper price.  Rx resent to [facility].  Derl Barrow, RN

## 2014-09-15 ENCOUNTER — Encounter: Payer: Self-pay | Admitting: Family Medicine

## 2014-10-06 ENCOUNTER — Ambulatory Visit: Payer: Medicaid Other | Admitting: Family Medicine

## 2014-11-19 ENCOUNTER — Ambulatory Visit (INDEPENDENT_AMBULATORY_CARE_PROVIDER_SITE_OTHER): Payer: Medicaid Other | Admitting: Family Medicine

## 2014-11-19 VITALS — BP 114/78 | HR 93 | Temp 97.3°F | Wt 123.0 lb

## 2014-11-19 DIAGNOSIS — I7410 Embolism and thrombosis of unspecified parts of aorta: Secondary | ICD-10-CM | POA: Diagnosis not present

## 2014-11-19 DIAGNOSIS — K5669 Other intestinal obstruction: Secondary | ICD-10-CM

## 2014-11-19 DIAGNOSIS — K56600 Partial intestinal obstruction, unspecified as to cause: Secondary | ICD-10-CM

## 2014-11-19 MED ORDER — IBUPROFEN 600 MG PO TABS: 600.0000 mg | ORAL_TABLET | Freq: Three times a day (TID) | ORAL | Status: DC | PRN

## 2014-11-19 NOTE — Progress Notes (Signed)
    Subjective   Rachel Bryan is a 64 y.o. female that presents for a same day visit  1. Hospital follow-up: Patient was seen at [facility] for bowel obstruction s/p lysis of abdominal adhesions. 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 She initially went to the ED for evaluation secondary to vomiting bile and abdominal pain. Patient has been doing well since discharge. Wound has not had discharge or erythema. No fevers or chills.   ROS Per HPI  Social History  Substance Use Topics  . Smoking status: Current Every Day Smoker -- 1.00 packs/day  . Smokeless tobacco: Not on file  . Alcohol Use: No    Allergies  Allergen Reactions  . Levaquin [Levofloxacin] Other (See Comments)    Per pt report.  Burning veins   . Penicillins Palpitations  . Codeine Hives and Other (See Comments)    sweating  . Demerol [Meperidine] Hives  . Morphine And Related Other (See Comments)    Hallucinations   . Sulfa Antibiotics Hives    Objective   BP 114/78 mmHg  Pulse 93  Temp(Src) 97.3 F (36.3 C) (Oral)  Wt 123 lb (55.792 kg)  General: Fair appearing, no distress Gastrointestinal: Soft, mildly tender over incision. Staples in place. No purulence, marked erythema or swelling  Assessment and Plan   Meds ordered this encounter  Medications  . ibuprofen (ADVIL,MOTRIN) 600 MG tablet    Sig: Take 1 tablet (600 mg total) by mouth every 8 (eight) hours as needed.    Dispense:  15 tablet    Refill:  0    Partial bowel obstruction, subsequent encounter: patient doing well post op. Staples (19) removed with minimal pain and no bleeding. Patient to follow-up with surgery.  Aortic mural thrombus: patient to follow-up with Vascular surgery after acute management of recent abdominal surgery

## 2014-11-19 NOTE — Patient Instructions (Addendum)
Thank you for coming to see me today. It was a pleasure. Today we talked about:   Recent hospital admission: I'm glad you are doing well. I have removed 19 staples from your belly. Please keep an eye out for the wound separating or discharge. If you develop fevers, abdominal pain, please return promptly. Please keep the wound dry as possible, although, you can take quick showers. Please continue your plavix as prescribed by your vascular surgeon.  Please make an appointment to see me in 2-4 weeks for follow-up of chronic issues.  If you have any questions or concerns, please do not hesitate to call the office at [phone].  Sincerely,  Cordelia Poche, MD   Suture Removal, Care After Refer to this sheet in the next few weeks. These instructions provide you with information on caring for yourself after your procedure. Your health care provider may also give you more specific instructions. Your treatment has been planned according to current medical practices, but problems sometimes occur. Call your health care provider if you have any problems or questions after your procedure. WHAT TO EXPECT AFTER THE PROCEDURE After your stitches (sutures) are removed, it is typical to have the following:  Some discomfort and swelling in the wound area.  Slight redness in the area. HOME CARE INSTRUCTIONS   If you have skin adhesive strips over the wound area, do not take the strips off. They will fall off on their own in a few days. If the strips remain in place after 14 days, you may remove them.  Change any bandages (dressings) at least once a day or as directed by your health care provider. If the bandage sticks, soak it off with warm, soapy water.  Apply cream or ointment only as directed by your health care provider. If using cream or ointment, wash the area with soap and water 2 times a day to remove all the cream or ointment. Rinse off the soap and pat the area dry with a clean towel.  Keep the  wound area dry and clean. If the bandage becomes wet or dirty, or if it develops a bad smell, change it as soon as possible.  Continue to protect the wound from injury.  Use sunscreen when out in the sun. New scars become sunburned easily. SEEK MEDICAL CARE IF:  You have increasing redness, swelling, or pain in the wound.  You see pus coming from the wound.  You have a fever.  You notice a bad smell coming from the wound or dressing.  Your wound breaks open (edges not staying together).   This information is not intended to replace advice given to you by your health care provider. Make sure you discuss any questions you have with your health care provider.   Document Released: 10/04/2000 Document Revised: 10/30/2012 Document Reviewed: 08/21/2012 Elsevier Interactive Patient Education [employer].

## 2014-11-24 DIAGNOSIS — I7410 Embolism and thrombosis of unspecified parts of aorta: Secondary | ICD-10-CM | POA: Insufficient documentation

## 2014-12-09 ENCOUNTER — Ambulatory Visit (INDEPENDENT_AMBULATORY_CARE_PROVIDER_SITE_OTHER): Payer: Medicaid Other | Admitting: Family Medicine

## 2014-12-09 VITALS — BP 122/70 | HR 71 | Temp 98.3°F

## 2014-12-09 DIAGNOSIS — F329 Major depressive disorder, single episode, unspecified: Secondary | ICD-10-CM

## 2014-12-09 DIAGNOSIS — T148XXA Other injury of unspecified body region, initial encounter: Secondary | ICD-10-CM

## 2014-12-09 DIAGNOSIS — T148 Other injury of unspecified body region: Secondary | ICD-10-CM

## 2014-12-09 DIAGNOSIS — F32A Depression, unspecified: Secondary | ICD-10-CM

## 2014-12-09 DIAGNOSIS — Z23 Encounter for immunization: Secondary | ICD-10-CM | POA: Diagnosis present

## 2014-12-09 LAB — CBC WITH DIFFERENTIAL/PLATELET
Basophils Absolute: 0.1 10*3/uL (ref 0.0–0.1)
Basophils Relative: 1 % (ref 0–1)
Eosinophils Absolute: 0.5 10*3/uL (ref 0.0–0.7)
Eosinophils Relative: 6 % — ABNORMAL HIGH (ref 0–5)
HEMATOCRIT: 35 % — AB (ref 36.0–46.0)
Hemoglobin: 11 g/dL — ABNORMAL LOW (ref 12.0–15.0)
LYMPHS PCT: 41 % (ref 12–46)
Lymphs Abs: 3.1 10*3/uL (ref 0.7–4.0)
MCH: 27.2 pg (ref 26.0–34.0)
MCHC: 31.4 g/dL (ref 30.0–36.0)
MCV: 86.4 fL (ref 78.0–100.0)
MONO ABS: 0.6 10*3/uL (ref 0.1–1.0)
MONOS PCT: 8 % (ref 3–12)
MPV: 9.7 fL (ref 8.6–12.4)
Neutro Abs: 3.3 10*3/uL (ref 1.7–7.7)
Neutrophils Relative %: 44 % (ref 43–77)
Platelets: 335 10*3/uL (ref 150–400)
RBC: 4.05 MIL/uL (ref 3.87–5.11)
RDW: 13.7 % (ref 11.5–15.5)
WBC: 7.5 10*3/uL (ref 4.0–10.5)

## 2014-12-09 NOTE — Patient Instructions (Addendum)
Thank you for coming to see me today. It was a pleasure. Today we talked about:   Bruises: this is likely related to your thinning skin. The Plavix may contribute but I will check your blood count  Healthcare maintenance: I want you to get a mammogram. We can discuss a colonoscopy at a later date  Depression: I'm glad you are feeling good today. No changes to your medications.  Please make an appointment to see me in 3-6 months or sooner for follow-up.  If you have any questions or concerns, please do not hesitate to call the office at [phone].  Sincerely,  Cordelia Poche, MD
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
## 2014-12-11 ENCOUNTER — Other Ambulatory Visit: Payer: Self-pay

## 2014-12-11 DIAGNOSIS — Z1231 Encounter for screening mammogram for malignant neoplasm of breast: Secondary | ICD-10-CM

## 2014-12-15 NOTE — Assessment & Plan Note (Signed)
Patient doing very well. Continue Zoloft 50mg  daily

## 2014-12-30 ENCOUNTER — Ambulatory Visit
Admission: RE | Admit: 2014-12-30 | Discharge: 2014-12-30 | Disposition: A | Discharge status: Home / Self Care | Payer: Medicaid Other | Source: Ambulatory Visit

## 2014-12-30 DIAGNOSIS — Z1231 Encounter for screening mammogram for malignant neoplasm of breast: Secondary | ICD-10-CM

## 2015-01-29 ENCOUNTER — Ambulatory Visit: Payer: Medicaid Other | Admitting: Family Medicine

## 2015-02-08 ENCOUNTER — Ambulatory Visit: Payer: Medicaid Other | Admitting: Family Medicine

## 2015-03-24 ENCOUNTER — Ambulatory Visit (INDEPENDENT_AMBULATORY_CARE_PROVIDER_SITE_OTHER): Payer: Medicaid Other | Admitting: Family Medicine

## 2015-03-24 VITALS — BP 130/70 | HR 71 | Temp 98.2°F | Wt 125.2 lb

## 2015-03-24 DIAGNOSIS — R159 Full incontinence of feces: Secondary | ICD-10-CM | POA: Diagnosis not present

## 2015-03-24 DIAGNOSIS — R42 Dizziness and giddiness: Secondary | ICD-10-CM

## 2015-03-24 DIAGNOSIS — R251 Tremor, unspecified: Secondary | ICD-10-CM

## 2015-03-24 LAB — CBC WITH DIFFERENTIAL/PLATELET
BASOS ABS: 0 10*3/uL (ref 0.0–0.1)
Basophils Relative: 0 % (ref 0–1)
EOS PCT: 4 % (ref 0–5)
Eosinophils Absolute: 0.3 10*3/uL (ref 0.0–0.7)
HEMATOCRIT: 35.6 % — AB (ref 36.0–46.0)
Hemoglobin: 11.3 g/dL — ABNORMAL LOW (ref 12.0–15.0)
LYMPHS ABS: 3 10*3/uL (ref 0.7–4.0)
Lymphocytes Relative: 41 % (ref 12–46)
MCH: 27 pg (ref 26.0–34.0)
MCHC: 31.7 g/dL (ref 30.0–36.0)
MCV: 85.2 fL (ref 78.0–100.0)
MONO ABS: 0.6 10*3/uL (ref 0.1–1.0)
MONOS PCT: 8 % (ref 3–12)
MPV: 10 fL (ref 8.6–12.4)
Neutro Abs: 3.4 10*3/uL (ref 1.7–7.7)
Neutrophils Relative %: 47 % (ref 43–77)
Platelets: 320 10*3/uL (ref 150–400)
RBC: 4.18 MIL/uL (ref 3.87–5.11)
RDW: 14.1 % (ref 11.5–15.5)
WBC: 7.2 10*3/uL (ref 4.0–10.5)

## 2015-03-24 NOTE — Patient Instructions (Signed)
Thank you for coming to see me today. It was a pleasure. Today we talked about:   Dizziness: I will get an MRI of your brain to make sure there is nothing going on there. If not, we may need to treat you symptomatically. Please follow-up after your MRI  Tremor: I want to make sure this is not related to anything going on in your brain. If not, I will start you on a medication to help minimize the tremor to allow you to be more functional  Fecal incontinence: I will refer you to the gastroenterologist. I do not think this is related to a spinal cord issue from what you've told me.   Please make an appointment to see me after your MRI.  If you have any questions or concerns, please do not hesitate to call the office at [phone].  Sincerely,  Cordelia Poche, MD
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
## 2015-03-25 LAB — COMPLETE METABOLIC PANEL WITH GFR
ALT: 7 U/L (ref 6–29)
AST: 11 U/L (ref 10–35)
Albumin: 4.2 g/dL (ref 3.6–5.1)
Alkaline Phosphatase: 63 U/L (ref 33–130)
BUN: 16 mg/dL (ref 7–25)
CALCIUM: 9.7 mg/dL (ref 8.6–10.4)
CHLORIDE: 106 mmol/L (ref 98–110)
CO2: 22 mmol/L (ref 20–31)
Creat: 0.85 mg/dL (ref 0.50–0.99)
GFR, EST AFRICAN AMERICAN: 84 mL/min (ref >=60)
GFR, Est Non African American: 73 mL/min (ref >=60)
Glucose, Bld: 87 mg/dL (ref 65–99)
POTASSIUM: 3.7 mmol/L (ref 3.5–5.3)
Sodium: 140 mmol/L (ref 135–146)
Total Bilirubin: 0.4 mg/dL (ref 0.2–1.2)
Total Protein: 6.9 g/dL (ref 6.1–8.1)

## 2015-04-03 ENCOUNTER — Other Ambulatory Visit: Payer: Self-pay | Admitting: Family Medicine

## 2015-04-05 ENCOUNTER — Other Ambulatory Visit: Payer: Medicaid Other

## 2015-04-13 ENCOUNTER — Other Ambulatory Visit: Payer: Medicaid Other

## 2015-05-07 ENCOUNTER — Telehealth: Payer: Self-pay | Admitting: Family Medicine

## 2015-05-07 NOTE — Telephone Encounter (Signed)
Received call from patient. Says that she has a severe migraine and wants something called in for the pain. No fevers, chills, weakness, or numbness. No loss of consciousness. Told patient that I could not send anything in without seeing her first and directed her to be either seen at urgent care or the ED. Patient voiced understanding and had no further questions.  Algis Greenhouse. Jerline Pain, [place] Resident PGY-2 05/07/2015 1:40 PM

## 2015-05-31 ENCOUNTER — Other Ambulatory Visit: Payer: Self-pay | Admitting: Family Medicine

## 2015-06-08 ENCOUNTER — Telehealth: Payer: Self-pay | Admitting: *Deleted

## 2015-06-08 NOTE — Telephone Encounter (Signed)
Angle from [facility] called stating patient No showed for appointment today.  However, patient can call to reschedule.  Derl Barrow, RN

## 2015-08-01 ENCOUNTER — Other Ambulatory Visit: Payer: Self-pay | Admitting: Family Medicine

## 2015-08-02 NOTE — Telephone Encounter (Signed)
Patient made an appt for this week. Dynasia Kercheval,CMA

## 2015-08-05 ENCOUNTER — Ambulatory Visit (INDEPENDENT_AMBULATORY_CARE_PROVIDER_SITE_OTHER): Payer: Medicaid Other | Admitting: Internal Medicine

## 2015-08-05 ENCOUNTER — Encounter: Payer: Self-pay | Admitting: Internal Medicine

## 2015-08-05 VITALS — BP 120/83 | HR 75 | Temp 98.1°F | Wt 121.2 lb

## 2015-08-05 DIAGNOSIS — G4700 Insomnia, unspecified: Secondary | ICD-10-CM | POA: Diagnosis present

## 2015-08-05 MED ORDER — BACLOFEN 10 MG PO TABS: 10.0000 mg | ORAL_TABLET | Freq: Three times a day (TID) | ORAL | Status: DC

## 2015-08-05 MED ORDER — TOPIRAMATE 25 MG PO TABS: 100.0000 mg | ORAL_TABLET | Freq: Every day | ORAL | Status: DC

## 2015-08-05 MED ORDER — SERTRALINE HCL 100 MG PO TABS: 50.0000 mg | ORAL_TABLET | Freq: Every day | ORAL | Status: DC

## 2015-08-05 MED ORDER — TRAZODONE HCL 50 MG PO TABS: 25.0000 mg | ORAL_TABLET | Freq: Every evening | ORAL | Status: DC | PRN

## 2015-08-05 NOTE — Progress Notes (Signed)
   Subjective:    Rachel Bryan - 65 y.o. female MRN [id number]  Date of birth: [birth year]  HPI  Rachel Bryan is here for follow up for medication refills and acute complaint of insomnia.   Insomnia: Has been occuring for one month. Reports that she goes to bed between 9-11 pm and then tosses and turns. Finally goes to sleep at 2 am and then has to get up at 7 am. Only thinks she gets about 3-4 hours of sleep per night. Reports that she has a lot of stress at night that keeps her up. Does not nap during the day. Will catch herself dozing off about 5 pm. Sleeps in living room and has TV on at night until about midnight. Believes she took some type of sleeping medication in the past but doesn't remember the name of medication.     Health Maintenance Due  Topic Date Due  . Hepatitis C Screening  [birth year]  . TETANUS/TDAP  03/01/1971  . PAP SMEAR  02/28/1973  . COLONOSCOPY  02/28/2002  . ZOSTAVAX  02/29/2012    -  reports that she has been smoking.  She does not have any smokeless tobacco history on file. - Review of Systems: Per HPI. - Past Medical History: Patient Active Problem List   Diagnosis Date Noted  . Aortic mural thrombus ([place]) 11/24/2014  . Muscle cramps 09/08/2014  . Chronic daily headache 09/08/2014  . Hair loss 09/08/2014  . Acute bronchitis 07/10/2014  . Memory difficulties 06/05/2014  . Tremor 06/05/2014  . Abnormality of gait 06/05/2014  . Body odor 03/30/2014  . Malnutrition of moderate degree ([place]) 08/29/2013  . Complicated migraine [id number]  . Tenderness of right calf 07/31/2013  . Urinary incontinence 12/25/2012  . Diarrhea 12/25/2012  . Falls 09/13/2012  . Depression 02/05/2012  . Dermoid cyst of arm 01/15/2012  . Headache 01/15/2012  . Anxiety 01/12/2012  . Insomnia 01/12/2012   - Medications: reviewed and updated    Objective:   Physical Exam BP 120/83 mmHg  Pulse 75  Temp(Src) 98.1 F (36.7 C) (Oral)  Wt 121 lb 3.2 oz (54.976 kg) Gen:  NAD, alert, cooperative with exam, well-appearing CV: RRR, good S1/S2, no murmur, no edema, capillary refill brisk  Resp: CTABL, no wheezes, non-labored Psych: appears somewhat anxious, answers questions appropriately   Assessment & Plan:   Insomnia Suspect mostly related to acute stressful situation at home. Patient not in ideal environment for restful sleep; however, no other options available.  -trial of Trazodone  -sleep hygiene discussed  -continue with Zoloft  -patient to schedule appointment with [place], D.O. 08/05/2015, 4:57 PM PGY-2, [place]

## 2015-08-05 NOTE — Assessment & Plan Note (Signed)
Suspect mostly related to acute stressful situation at home. Patient not in ideal environment for restful sleep; however, no other options available.  -trial of Trazodone  -sleep hygiene discussed  -continue with Zoloft  -patient to schedule appointment with [facility]

## 2015-08-05 NOTE — Patient Instructions (Addendum)
Today we discussed your issue with sleeping. I have prescribed a medication to help you get to sleep.   Please practice good sleep hygiene as much as possible. Turn TV off 2 hours prior to bedtime, go to bed at the same time each night, develop a bed time routine that helps make you sleep (examples: reading, taking a bath, non-caffeine tea, etc).   Please make an appointment at the front desk for [facility]). I think this will help with a safe place for you to discuss your feelings.   Please make a follow up appointment with Dr. Juleen China within the next month.

## 2015-08-18 ENCOUNTER — Telehealth: Payer: Self-pay | Admitting: *Deleted

## 2015-08-18 NOTE — Telephone Encounter (Signed)
Patient asking if there is something else that can be prescribed, states the medication that was given to her last week for her leg cramps isnt helping and she has even been doubling up on them.

## 2015-08-18 NOTE — Telephone Encounter (Signed)
I believe she is referring to the Baclofen. If she is needing to double up on it without having any relief she needs to have an appointment to discuss other treatment options.   Phill Myron, D.O. 08/18/2015, 3:58 PM PGY-2, [place]

## 2015-08-19 ENCOUNTER — Ambulatory Visit: Payer: Medicaid Other

## 2015-08-19 NOTE — Telephone Encounter (Signed)
LVM for pt to call our office. If pt calls, please see note below. Ottis Stain, CMA

## 2015-08-31 NOTE — Telephone Encounter (Signed)
PT has appointment with PCP on 8/15 contacted her and she said that it is still bad and the medicine she has is not working,  and I told her to be sure to discuss this with her PCP at the appointment. Katharina Caper, April D, [place]

## 2015-09-07 ENCOUNTER — Ambulatory Visit: Payer: Medicaid Other | Admitting: Internal Medicine

## 2015-10-20 ENCOUNTER — Other Ambulatory Visit: Payer: Self-pay | Admitting: *Deleted

## 2015-10-20 NOTE — Telephone Encounter (Signed)
Pt is calling for a refill on her baclofen. jw

## 2015-10-21 MED ORDER — BACLOFEN 10 MG PO TABS: 10.0000 mg | ORAL_TABLET | Freq: Three times a day (TID) | ORAL | 0 refills | Status: DC

## 2015-11-04 ENCOUNTER — Ambulatory Visit: Payer: Medicaid Other | Admitting: Internal Medicine

## 2015-11-23 ENCOUNTER — Other Ambulatory Visit: Payer: Self-pay | Admitting: Internal Medicine

## 2015-11-23 DIAGNOSIS — Z1231 Encounter for screening mammogram for malignant neoplasm of breast: Secondary | ICD-10-CM

## 2015-11-24 ENCOUNTER — Other Ambulatory Visit: Payer: Self-pay | Admitting: Internal Medicine

## 2015-11-24 DIAGNOSIS — G4700 Insomnia, unspecified: Secondary | ICD-10-CM

## 2015-11-29 ENCOUNTER — Ambulatory Visit: Payer: Medicaid Other | Admitting: Family Medicine

## 2015-12-07 ENCOUNTER — Other Ambulatory Visit: Payer: Self-pay | Admitting: Internal Medicine

## 2015-12-26 ENCOUNTER — Other Ambulatory Visit: Payer: Self-pay | Admitting: Internal Medicine

## 2015-12-27 NOTE — Telephone Encounter (Signed)
Dr. Juleen China is out of the office currently. Per chart review, it seems that Dr. Juleen China wanted patient to be seen in clinic for her muscle cramps soon. Refilled medication. Please call patient to make a follow up appointment with Dr. Juleen China as soon as possible.

## 2015-12-31 ENCOUNTER — Ambulatory Visit: Payer: Medicaid Other

## 2016-01-12 ENCOUNTER — Ambulatory Visit: Payer: Medicaid Other | Admitting: Internal Medicine

## 2016-01-12 NOTE — Telephone Encounter (Signed)
Contacted pt to see about getting an appointment scheduled to follow up, she was scheduled today and did not make the appointment. Pt stated that she would contact us once she feels better to get this appointment scheduled. Katharina Caper, April D, [place]

## 2016-01-13 ENCOUNTER — Other Ambulatory Visit: Payer: Self-pay | Admitting: Internal Medicine

## 2016-01-31 ENCOUNTER — Other Ambulatory Visit: Payer: Self-pay | Admitting: Family Medicine

## 2016-02-04 ENCOUNTER — Ambulatory Visit: Payer: Medicaid Other

## 2016-02-16 ENCOUNTER — Ambulatory Visit: Payer: Medicaid Other | Admitting: Internal Medicine

## 2016-02-24 ENCOUNTER — Other Ambulatory Visit: Payer: Self-pay | Admitting: Internal Medicine

## 2016-03-12 ENCOUNTER — Other Ambulatory Visit: Payer: Self-pay | Admitting: Internal Medicine

## 2016-03-18 ENCOUNTER — Other Ambulatory Visit: Payer: Self-pay | Admitting: Internal Medicine

## 2016-03-18 DIAGNOSIS — G4700 Insomnia, unspecified: Secondary | ICD-10-CM

## 2016-03-24 ENCOUNTER — Telehealth: Payer: Self-pay | Admitting: Internal Medicine

## 2016-03-24 NOTE — Telephone Encounter (Signed)
Pt ordered her pull ups on-line yesterday. She ordered them through [employer].  The company will be sending dr some paperwork that will need to be filled out.  Just wanted dr Juleen China to know

## 2016-03-28 ENCOUNTER — Ambulatory Visit (INDEPENDENT_AMBULATORY_CARE_PROVIDER_SITE_OTHER): Payer: Medicaid Other | Admitting: Internal Medicine

## 2016-03-28 ENCOUNTER — Encounter: Payer: Self-pay | Admitting: Internal Medicine

## 2016-03-28 ENCOUNTER — Other Ambulatory Visit (HOSPITAL_COMMUNITY)
Admission: RE | Admit: 2016-03-28 | Discharge: 2016-03-28 | Disposition: A | Discharge status: Home / Self Care | Payer: Medicaid Other | Source: Ambulatory Visit | Attending: Family Medicine | Admitting: Family Medicine

## 2016-03-28 VITALS — BP 122/64 | HR 82 | Temp 97.6°F | Ht 67.0 in | Wt 128.4 lb

## 2016-03-28 DIAGNOSIS — F329 Major depressive disorder, single episode, unspecified: Secondary | ICD-10-CM

## 2016-03-28 DIAGNOSIS — Z124 Encounter for screening for malignant neoplasm of cervix: Secondary | ICD-10-CM | POA: Diagnosis not present

## 2016-03-28 DIAGNOSIS — F32A Depression, unspecified: Secondary | ICD-10-CM

## 2016-03-28 DIAGNOSIS — Z1151 Encounter for screening for human papillomavirus (HPV): Secondary | ICD-10-CM | POA: Diagnosis present

## 2016-03-28 DIAGNOSIS — Z01411 Encounter for gynecological examination (general) (routine) with abnormal findings: Secondary | ICD-10-CM | POA: Insufficient documentation

## 2016-03-28 MED ORDER — TOPIRAMATE 25 MG PO TABS: 100.0000 mg | ORAL_TABLET | Freq: Every day | ORAL | 5 refills | Status: DC

## 2016-03-28 MED ORDER — CITALOPRAM HYDROBROMIDE 20 MG PO TABS: 20.0000 mg | ORAL_TABLET | Freq: Every day | ORAL | 0 refills | Status: DC

## 2016-03-28 MED ORDER — BACLOFEN 10 MG PO TABS: ORAL_TABLET | ORAL | 0 refills | Status: DC

## 2016-03-28 NOTE — Patient Instructions (Signed)
I have sent in Celexa for your depression. Please make a follow up in 2 weeks. If you have any suicidal thoughts stop the medication and let us know. You can always make an appointment for behavioral health for counseling.

## 2016-03-31 DIAGNOSIS — Z124 Encounter for screening for malignant neoplasm of cervix: Secondary | ICD-10-CM | POA: Insufficient documentation

## 2016-03-31 LAB — CYTOLOGY - PAP
DIAGNOSIS: NEGATIVE
HPV (WINDOPATH): NOT DETECTED

## 2016-03-31 NOTE — Assessment & Plan Note (Addendum)
Have discontinued Zoloft due to inconsistent use and side effect of sedation. Interestingly, it as listed as one of the less sedating SSRIs on UpToDate. Will attempt trial of Celexa 20 mg (do not plan to dose increase given age >68). Encouraged counseling as adjunctive treatment for depression and patient declined. Reports she uses walking and journaling for her therapy. Patient to return in 1-2 weeks for follow up.

## 2016-03-31 NOTE — Assessment & Plan Note (Signed)
Patient reports hysterectomy for uterine cancer. No cervix present on exam. However, guidelines are that she should have had vaginal cuff screening yearly for every 20 years. There is no record in the EMR that she had this (can only find normal PAP in 2009) and patient does not recollect. Have decided to perform yearly screening for 5 years then discontinue if all normal given her age.

## 2016-03-31 NOTE — Progress Notes (Signed)
Subjective:    Rachel Bryan - 66 y.o. female MRN [id number]  Date of birth: 1950/11/15  HPI  Rachel Bryan is here for pap smear and depressiom.  Pap: Patient presenting requesting PAP. She reports having hysterectomy for uterine cancer 35-40 years ago. Is unsure whether she still has cervix present. Reports she does not remember having screening PAPs s/p uterine cancer. Denies pelvic pain, abnormal vaginal bleeding or discharge.    Depression:  Report of symptoms: Feeling of failure to her family for not being able to provide the care she wants to give them. Reports crying a lot. Feels moody and irritable towards those she loves. Not interested in anything.  Duration of CURRENT symptoms:Months (at least >6)  Age of onset of first mood disturbance:Unsure, adulthood  Impact on function:Makes completing tasks difficult.  Psychiatric History - Diagnoses:Depression  - Hospitalizations:  None  - Pharmacotherapy: Currently on Zoloft but has not been taking regularly as she feels it makes her sedated (presents to clinic with mostly full bottle that still has refills available, last prescribed in July 2017)  - Outpatient therapy: Has tried counseling but feels like it doesn't work.  Family history of psychiatric issues:Sons with depression. Mother with depression/anxiety. Grandson with bipolar and schizophrenia.  Current and history of substance use: No present or past use.  Denies suicidal ideation and past history of suicide attempts.    PHQ-9:17 MDQ:3 GAD7:10   -  reports that she has been smoking.  She has been smoking about 1.00 pack per day. She has never used smokeless tobacco. - Review of Systems: Per HPI. - Past Medical History: Patient Active Problem List   Diagnosis Date Noted  . Screening for cervical cancer 03/31/2016  . 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 Muscle cramps 09/08/2014  . Chronic daily headache 09/08/2014  . Hair loss 09/08/2014  . Acute bronchitis  07/10/2014  . Memory difficulties 06/05/2014  . Tremor 06/05/2014  . Abnormality of gait 06/05/2014  . Body odor 03/30/2014  . Malnutrition of moderate degree ([place]) 08/29/2013  . Complicated migraine 96/78/9381  . Tenderness of right calf 07/31/2013  . Urinary incontinence 12/25/2012  . Diarrhea 12/25/2012  . Falls 09/13/2012  . Depression 02/05/2012  . Dermoid cyst of arm 01/15/2012  . Headache 01/15/2012  . Anxiety 01/12/2012  . Insomnia 01/12/2012   - Medications: reviewed and updated   Objective:   Physical Exam BP 122/64   Pulse 82   Temp 97.6 F (36.4 C) (Oral)   Ht 5\' 7"  (1.702 m)   Wt 128 lb 6.4 oz (58.2 kg)   SpO2 97%   BMI 20.11 kg/m  Gen: NAD, alert, cooperative with exam, well-appearing GU/GYN: Exam performed in the presence of a chaperone. External genitalia within normal limits.  Vaginal mucosa pink, moist, normal rugae.  No cervix visualized on speculum exam.  Bimanual exam without cervix. No adnexal masses bilaterally.       Assessment & Plan:   Screening for cervical cancer Patient reports hysterectomy for uterine cancer. No cervix present on exam. However, guidelines are that she should have had vaginal cuff screening yearly for every 20 years. There is no record in the EMR that she had this (can only find normal PAP in 2009) and patient does not recollect. Have decided to perform yearly screening for 5 years then discontinue if all normal given her age.   Depression Have discontinued Zoloft due to inconsistent use and side effect of sedation. Interestingly, it as  listed as one of the less sedating SSRIs on UpToDate. Will attempt trial of Celexa 20 mg (do not plan to dose increase given age >53). Encouraged counseling as adjunctive treatment for depression and patient declined. Reports she uses walking and journaling for her therapy. Patient to return in 1-2 weeks for follow up.    Recommended patient get a mammogram as it has been almost 2 years since  previous mammogram.   Phill Myron, D.O. 03/31/2016, 3:06 PM PGY-2, [place]

## 2016-04-03 ENCOUNTER — Telehealth: Payer: Self-pay | Admitting: *Deleted

## 2016-04-03 NOTE — Telephone Encounter (Signed)
Pt informed. Rachel Bryan, CMA  

## 2016-04-03 NOTE — Telephone Encounter (Signed)
LVM for pt to call back to inform her of below. Zimmerman Rumple, Ellyse Rotolo D, CMA  

## 2016-04-03 NOTE — Telephone Encounter (Signed)
-----   Message from Nicolette Bang, DO sent at 04/03/2016  9:43 AM EDT ----- Please call patient and let her know PAP was negative.   Phill Myron, D.O. 04/03/2016, 9:43 AM PGY-2, [facility]

## 2016-04-12 ENCOUNTER — Ambulatory Visit: Payer: Medicaid Other

## 2016-04-12 ENCOUNTER — Telehealth: Payer: Self-pay

## 2016-04-12 NOTE — Telephone Encounter (Signed)
-----   Message from Nicolette Bang, DO sent at 04/03/2016  9:43 AM EDT ----- Please call patient and let her know PAP was negative.   Phill Myron, D.O. 04/03/2016, 9:43 AM PGY-2, [place]

## 2016-04-12 NOTE — Telephone Encounter (Signed)
LVM for pt to call the office. Is she calls, please give her the information below. Ottis Stain, CMA

## 2016-04-26 ENCOUNTER — Ambulatory Visit: Payer: Medicaid Other | Admitting: Internal Medicine

## 2016-05-07 ENCOUNTER — Other Ambulatory Visit: Payer: Self-pay | Admitting: Internal Medicine

## 2016-06-25 ENCOUNTER — Other Ambulatory Visit: Payer: Self-pay | Admitting: Internal Medicine

## 2016-08-10 ENCOUNTER — Other Ambulatory Visit: Payer: Self-pay | Admitting: Internal Medicine

## 2016-08-10 DIAGNOSIS — G4700 Insomnia, unspecified: Secondary | ICD-10-CM

## 2016-11-21 ENCOUNTER — Ambulatory Visit: Payer: Self-pay | Admitting: Family Medicine

## 2016-11-21 ENCOUNTER — Telehealth: Payer: Self-pay

## 2016-11-21 NOTE — Telephone Encounter (Signed)
Pt called. Did not make appt today because transportation did not come. Pt rescheduled for Nov 20th. Pt needs a refill of trazodone and plavax. Would like to get enough to last until her appt on Nov 20th. Please let pt know if that can be done. Ottis Stain, CMA

## 2016-11-23 ENCOUNTER — Other Ambulatory Visit: Payer: Self-pay | Admitting: Family Medicine

## 2016-11-23 DIAGNOSIS — G4700 Insomnia, unspecified: Secondary | ICD-10-CM

## 2016-11-23 MED ORDER — TRAZODONE HCL 50 MG PO TABS: ORAL_TABLET | ORAL | 3 refills | Status: DC

## 2016-11-23 NOTE — Telephone Encounter (Signed)
Refilled trazodone, but did not see plavix on her med list.   Please call patient to let her know. Thanks  Staci Dack L. Rosalyn Gess, [facility] Resident PGY-2 11/23/2016 2:36 PM

## 2016-11-23 NOTE — Telephone Encounter (Signed)
LVM for pt to call the office. If pt calls, please give her the information below. Thedford Bunton T Noralyn Karim, CMA  

## 2016-12-12 ENCOUNTER — Ambulatory Visit: Payer: Medicaid Other | Admitting: Family Medicine

## 2017-02-09 ENCOUNTER — Ambulatory Visit: Payer: Medicaid Other | Admitting: Family Medicine

## 2017-02-09 IMAGING — CT CT ABD-PELV W/ CM
2 of 5 series · 16 of 46 positions shown, 18 images · IV contrast (APPLIED)
Comparison: 03/09/2006, 01/19/2012

CLINICAL DATA: Diffuse abdominal pain and tenderness, bloating,
frequent urination, incontinence, history anxiety, appendectomy,
hysterectomy

EXAM:
CT ABDOMEN AND PELVIS WITH CONTRAST
TECHNIQUE: Multidetector CT imaging of the abdomen and pelvis was performed
using the standard protocol following bolus administration of
intravenous contrast.
CONTRAST:  100mL OMNIPAQUE IOHEXOL 300 MG/ML SOLN IV. No oral
contrast was administered.

[Series 2: abd/pelvis 5.0 b31f · axial · 0.67mm/px · z∈[-536,-110]mm · 13 of 95 slices shown, 15 images]
[im 5/95  soft-tissue]
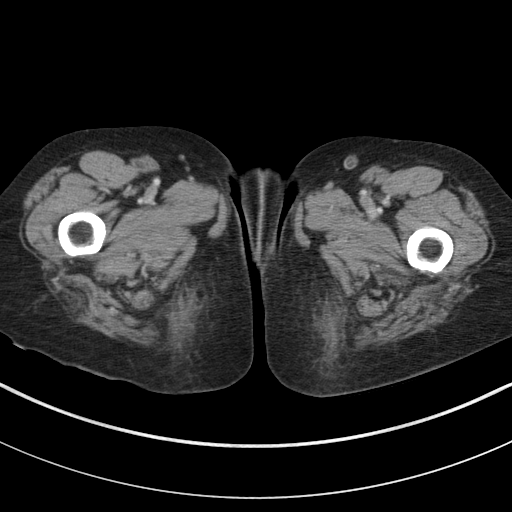
[im 5/95  bone]
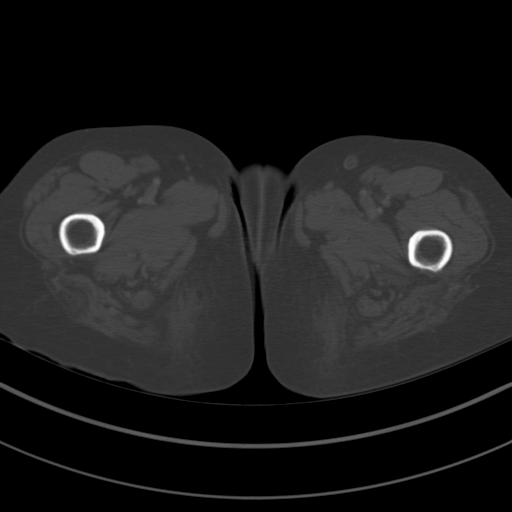
[im 15/95  soft-tissue]
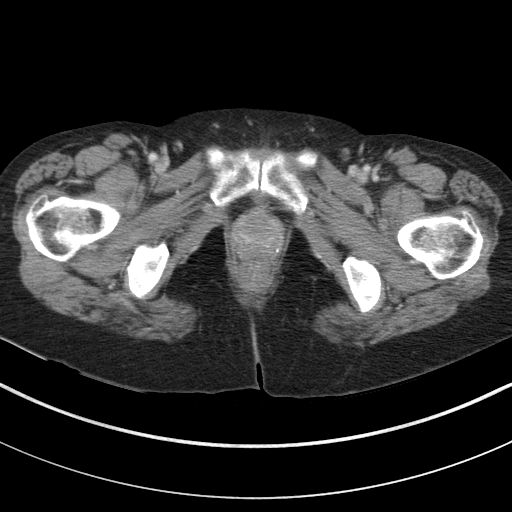
[im 19/95  soft-tissue]
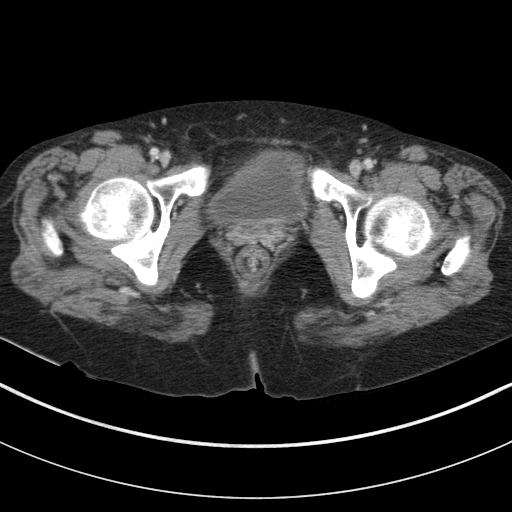
[im 29/95  soft-tissue]
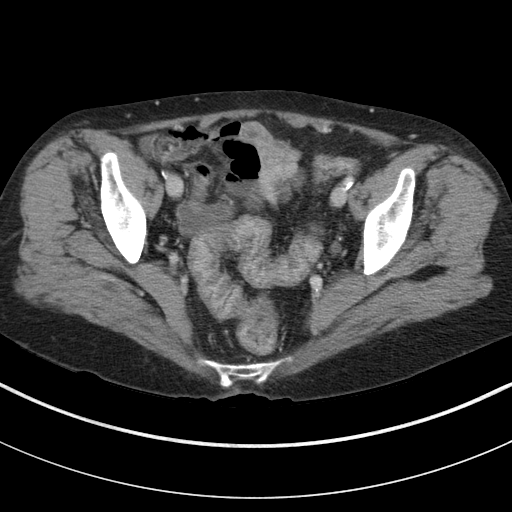
[im 33/95  soft-tissue]
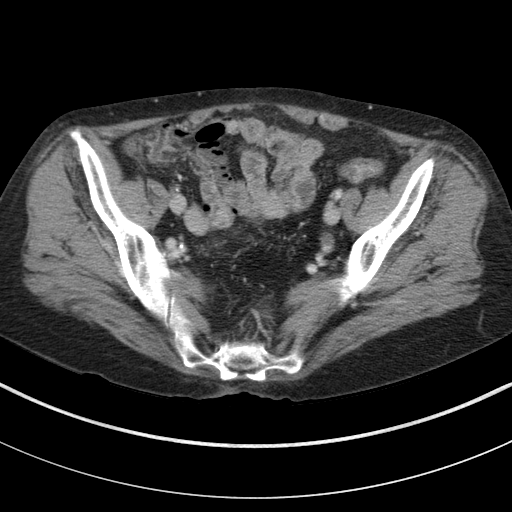
[im 43/95  soft-tissue]
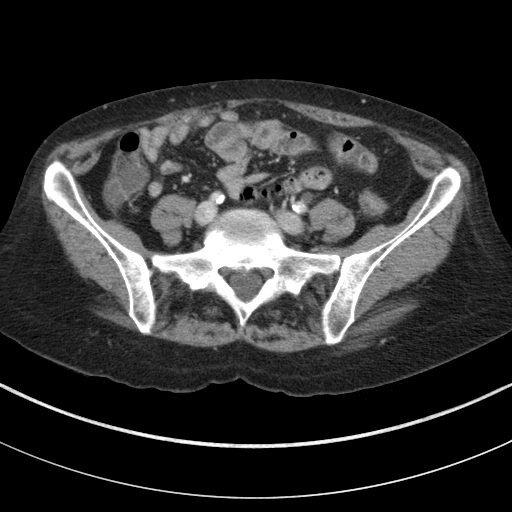
[im 48/95  soft-tissue]
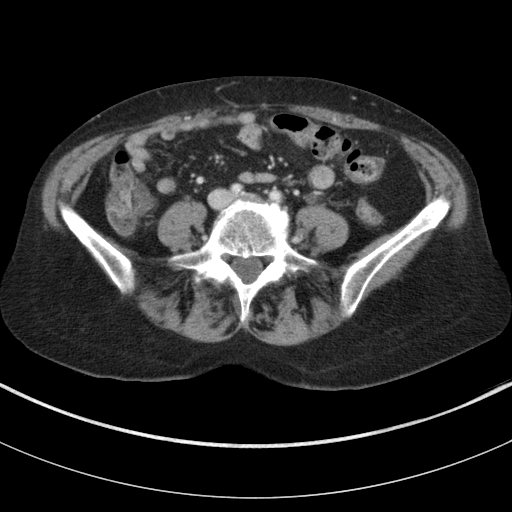
[im 52/95  soft-tissue]
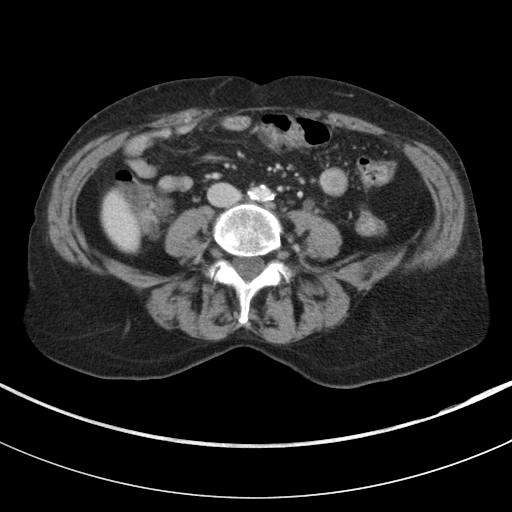
[im 62/95  soft-tissue]
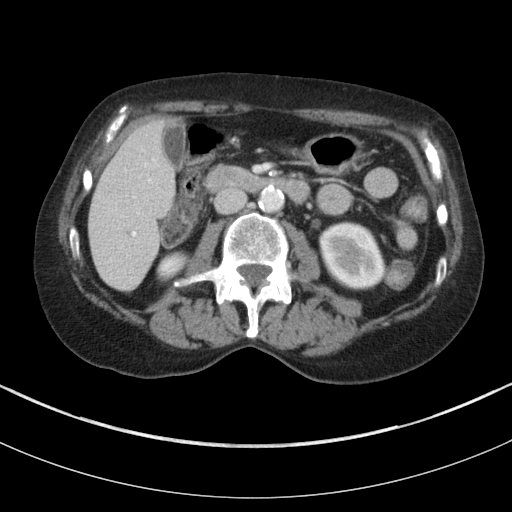
[im 62/95  bone]
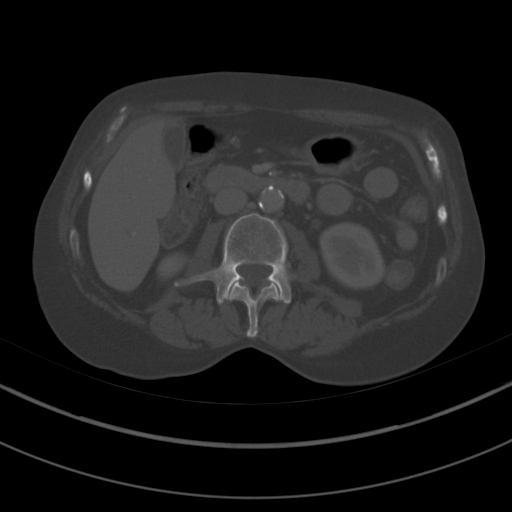
[im 66/95  soft-tissue]
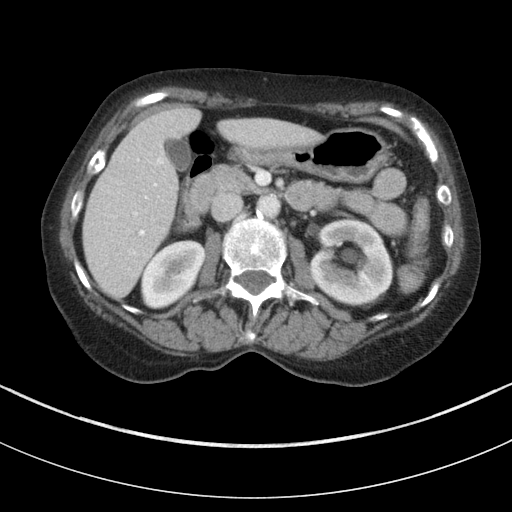
[im 76/95  soft-tissue]
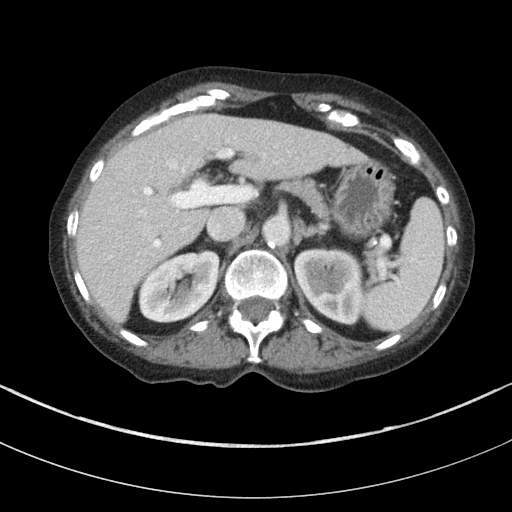
[im 80/95  soft-tissue]
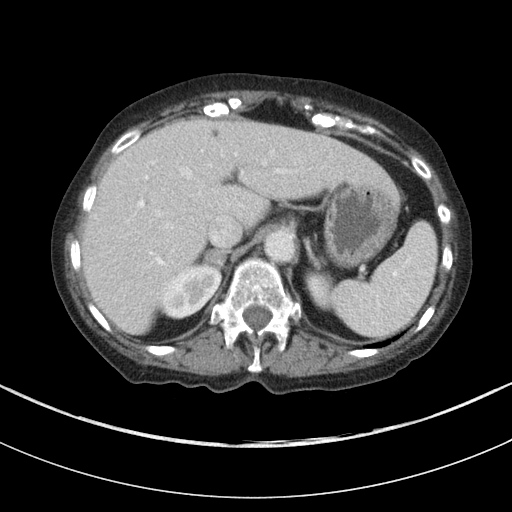
[im 90/95  soft-tissue]
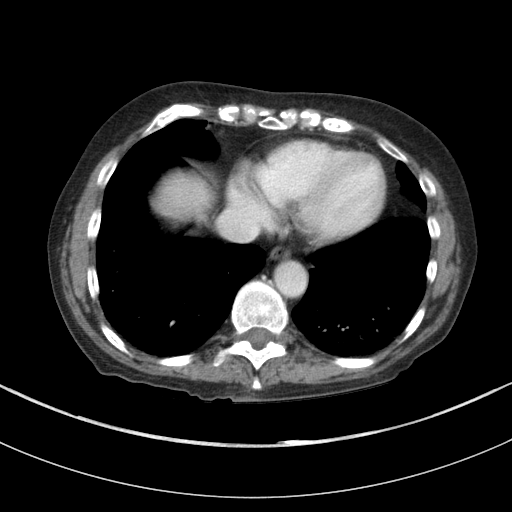

[Series 5: abd/pelvis 3.0 coronal · coronal · 0.74mm/px · 3 of 72 slices shown]
[im 24/72  soft-tissue]
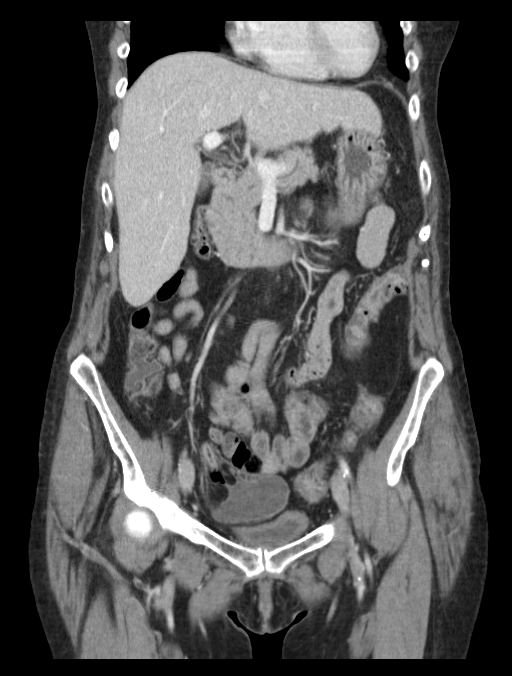
[im 32/72  soft-tissue]
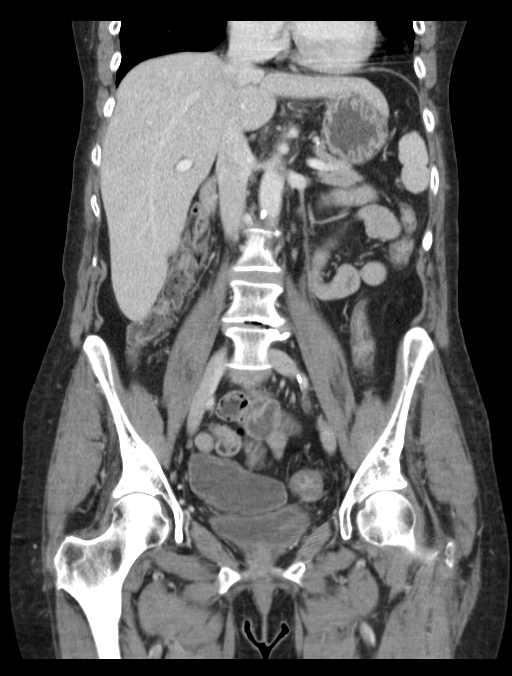
[im 40/72  soft-tissue]
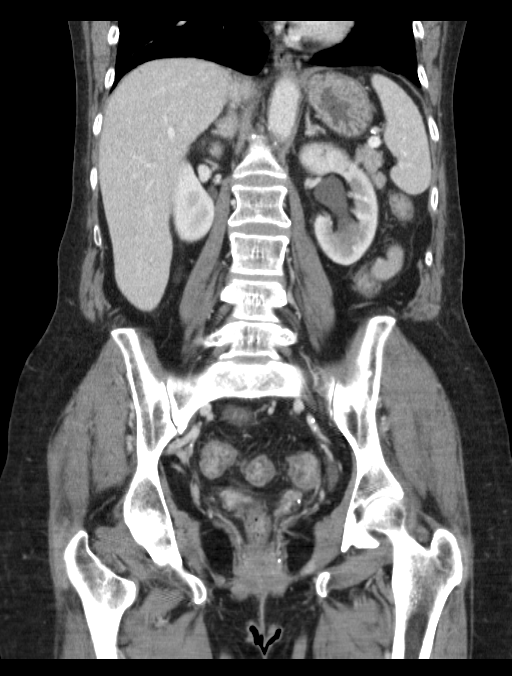

[16 of 46 positions shown; findings below may reference images not displayed]

FINDINGS: Dependent density in both lower lobes.

Tiny nonobstructing RIGHT renal calculi.

6 mm diameter intermediate attenuation nodule inferior pole LEFT
kidney image 25 series 7.

LEFT hydronephrosis and hydroureter secondary to a 2-3 mm LEFT
ureterovesical junction calculus.

Mild delay in LEFT nephrogram versus RIGHT.

Tiny hepatic cysts with calcified 9 mm gallstone within gallbladder.

Liver, spleen, pancreas, kidneys, and adrenal glands otherwise
normal.

Scattered atherosclerotic calcifications aorta.

Prominent fat at ileocecal valve with fluid filled cecum, unchanged.

Stomach and bowel loops otherwise unremarkable for exam lacking GI
contrast.

No mass, adenopathy, free air or free fluid.

Degenerative disc disease changes L4-L5.
IMPRESSION: LEFT hydronephrosis and hydroureter secondary to a 2-3 mm diameter
LEFT UVJ calculus with associated mild delay in LEFT nephrogram
versus RIGHT.

Nonobstructing RIGHT renal calculi.

Cholelithiasis.

Indeterminate 6 mm nodule inferior pole LEFT kidney corresponding to
a 6 mm intermediate attenuation nodule on the 2113 exam suspect
stable tiny hyperdense LEFT renal cyst.

## 2017-02-27 ENCOUNTER — Ambulatory Visit: Payer: Medicaid Other | Admitting: Family Medicine

## 2017-03-19 ENCOUNTER — Telehealth: Payer: Self-pay | Admitting: Family Medicine

## 2017-03-19 ENCOUNTER — Encounter: Payer: Self-pay | Admitting: Student

## 2017-03-19 NOTE — Telephone Encounter (Signed)
Will forward to provider who will see patient to create letter. Kasie Leccese, Salome Spotted, CMA

## 2017-03-19 NOTE — Telephone Encounter (Signed)
Pt is needing a letter sent to transportation verifying her sister [employer] ride with her to her drs appt on Thursday.  Fax number is [phone].

## 2017-03-19 NOTE — Telephone Encounter (Signed)
Created letter and routed to white and admin pool.

## 2017-03-20 NOTE — Telephone Encounter (Signed)
Letter printed and faxed to [phone]

## 2017-03-22 ENCOUNTER — Ambulatory Visit (INDEPENDENT_AMBULATORY_CARE_PROVIDER_SITE_OTHER): Payer: Medicare HMO | Admitting: Student

## 2017-03-22 ENCOUNTER — Other Ambulatory Visit: Payer: Self-pay

## 2017-03-22 ENCOUNTER — Ambulatory Visit: Payer: Medicaid Other | Admitting: Student

## 2017-03-22 ENCOUNTER — Encounter: Payer: Self-pay | Admitting: Student

## 2017-03-22 VITALS — BP 118/66 | HR 81 | Temp 97.7°F | Ht 67.0 in | Wt 125.8 lb

## 2017-03-22 DIAGNOSIS — R05 Cough: Secondary | ICD-10-CM | POA: Diagnosis not present

## 2017-03-22 DIAGNOSIS — R059 Cough, unspecified: Secondary | ICD-10-CM

## 2017-03-22 MED ORDER — AZITHROMYCIN 250 MG PO TABS: ORAL_TABLET | ORAL | 0 refills | Status: AC

## 2017-03-22 MED ORDER — PREDNISONE 50 MG PO TABS: 50.0000 mg | ORAL_TABLET | Freq: Every day | ORAL | 0 refills | Status: DC

## 2017-03-22 MED ORDER — ALBUTEROL SULFATE HFA 108 (90 BASE) MCG/ACT IN AERS: 2.0000 | INHALATION_SPRAY | Freq: Four times a day (QID) | RESPIRATORY_TRACT | 0 refills | Status: DC | PRN

## 2017-03-22 NOTE — Progress Notes (Signed)
Subjective:    Rachel Bryan is a 67 y.o. old female here for cough.   HPI Cough: this has been going on for one week. Started with runny nose and congestion.  She says cough is dry but she has yellowish to grayish phlegm in exam room.  No hemoptysis. Cough progression is about the same. No fever. Occasional shortness of breath with cough. Reports left sided chest pain with cough. Not exertional.  She also reports poor appetite and fatigue. She tried tussin over the counter without significant improvement. Denies nausea, vomiting, diarrhea, excessive night sweat, leg swelling or pain except for her usual muscle cramps.  She hasn't had flu vaccine this season.  She states grand-daughter with cough and diagnosed with allergy. Denies postnasal dripping, reflux or heartburn.   Have been smoking a pack a day since she was 67 years of age.  She is not ready to quit.  PMH/Problem List: has Anxiety; Insomnia; Dermoid cyst of arm; Depression; Falls; Urinary incontinence; Malnutrition of moderate degree ([place]); Complicated migraine; Memory difficulties; Tremor; Abnormality of gait; Acute bronchitis; Aortic mural thrombus ([facility]); and Screening for cervical cancer on their problem list.   has a past medical history of Abnormality of gait (06/05/2014), Anxiety, Cluster headache, Incontinence of urine, Insomnia secondary to anxiety, Lactose intolerance, and Memory difficulties (06/05/2014).  FH:  Family History  Problem Relation Age of Onset  . Transient ischemic attack Mother   . Cancer Mother        lung (smoker)  . Anxiety disorder Mother   . Cancer Father        prostate  . Hypertension Father   . Cancer Maternal Grandmother        leukemia  . Mental retardation Sister   . Healthy Brother   . Healthy Brother     [facility] Social History   Tobacco Use  . Smoking status: Current Every Day Smoker    Packs/day: 1.00  . Smokeless tobacco: Never Used  Substance Use Topics  . Alcohol use: No   Alcohol/week: 0.0 oz  . Drug use: No    Review of Systems Review of systems negative except for pertinent positives and negatives in history of present illness above.     Objective:     Vitals:   03/22/17 1039  BP: 118/66  Pulse: 81  Temp: 97.7 F (36.5 C)  TempSrc: Oral  SpO2: 93%  Weight: 125 lb 12.8 oz (57.1 kg)  Height: 5\' 7"  (1.702 m)   Body mass index is 19.7 kg/m.  Physical Exam  GEN: Pleasant, elderly female, appears older than age Head: normocephalic and atraumatic  Eyes: conjunctiva without injection, sclera anicteric Nares: no rhinorrhea, congestion or erythema  Oropharynx: mmm without erythema or exudation.  No petechiae.  Uvula midline.  Poor dentition HEM: negative for cervical or periauricular lymphadenopathies CVS: RRR, nl s1 & s2, no murmurs, no edema RESP: no IWOB. Good air movement bilaterally. End-expiratory wheeze. no crackles GI: BS present & normal, soft, NTND MSK: No calf tenderness or swelling SKIN: no apparent skin lesion ENDO: negative thyromegally NEURO: alert and oiented appropriately, no gross deficits  PSYCH: euthymic mood with congruent affect Assessment and Plan:  1. Cough: Concerning for COPD exacerbation in the setting of viral URI although she had no diagnosis of COPD or spirometry.  She has 50-pack-year smoking history.  She continues to smoke about a pack a day.  She appears to have productive cough with yellowish to grayish phlegm without hemoptysis.  Lung exam with  end expiratory wheeze bilaterally.  No crackles.  Other than cough intermittently, she does not appear to have respiratory distress.  Oxygen saturation 93% on room air.  Treating a COPD exacerbation with steroid, Z-Pak and albuterol inhaler.  Suggested trying Mucinex DM and a tablespoonful of honey before bedtime.  Recommended good hydration.  Discussed return precautions.  Recommended quitting smoking but she is a still in precontemplation state.  Recommended follow-up with  PCP for spirometry when she is back to her baseline.   Return if symptoms worsen or fail to improve.  Mercy Riding, MD 03/22/17 Pager: [phone]

## 2017-03-22 NOTE — Patient Instructions (Addendum)
It was great seeing you today! We have addressed the following issues today  Cough: This could be due to your recent viral illness.  I am also concerned about COPD.  We sent a prescription for antibiotic, steroid and breathing treatment to your pharmacy.  I also suggest getting Mucinex DM, which is available over-the-counter.  You can take a tablespoonful of honey before bedtime, which is helpful with cough.  Keep yourself hydrated.  Follow-up with your primary care doctor in 2-3 weeks or sooner if no improvement.  - A tablespoonful of honey before bedtime is helpful for cough  - Get plenty of rest and adequate hydration.  - Consume warm fluids (soup or tea). It relieves stuffy nose, and to loosen phlegm.  - Can try saline nasal spray or a Neti Pot for stuffy nose  CONTACT YOUR DOCTOR IF YOU EXPERIENCE ANY OF THE FOLLOWING: - High fever, chest pain, shortness of breath or  not able to keep down food or fluids.  - Cough that gets worse while other cold symptoms improve - Flare up of any chronic lung problem, such as asthma - Your symptoms persist longer than 2 weeks   Tobacco use: I strongly recommend quitting smoking. Call 1800-QUIT-NOW for help with stopping smoking.  If we did any lab work today, and the results require attention, either me or my nurse will get in touch with you. If everything is normal, you will get a letter in mail and a message via . If you don't hear from [place] in two weeks, please give [place] a call. Otherwise, we look forward to seeing you again at your next visit. If you have any questions or concerns before then, please call the clinic at [phone].  Please bring all your medications to every doctors visit  Sign up for My Chart to have easy access to your labs results, and communication with your Primary care physician.    Please check-out at the front desk before leaving the clinic.    Take Care,   Dr. Cyndia Skeeters

## 2017-04-11 ENCOUNTER — Telehealth: Payer: Self-pay

## 2017-04-11 NOTE — Telephone Encounter (Signed)
Page,  Could you ask this patient what "breathing machine" she is using, because her request is unusual.  Thanks  Marjie Skiff, MD [place], PGY-2

## 2017-04-11 NOTE — Telephone Encounter (Signed)
Pt left message on nurse line requesting a letter written stating she uses a "breathing machine" and her electricity cannot be turned off. I do not see anywhere in her chart where we have prescribed a nebulizer or nebulizer medications to this patients. Only Rx for albuterol inhaler. Left fax number for [place] [phone] at [facility] Will forward to MD. Wallace Cullens, RN

## 2017-04-11 NOTE — Telephone Encounter (Addendum)
Called pt to discuss, pt states her granddaughter, Madell Heino #[phone], is on a nebulizer. I will create a new note in [facility] chart. Noah Delaine is not your patient. Disregard this message.

## 2017-04-16 ENCOUNTER — Ambulatory Visit: Payer: Medicare HMO | Admitting: Family Medicine

## 2017-05-19 ENCOUNTER — Inpatient Hospital Stay (HOSPITAL_COMMUNITY)
Admission: EM | Admit: 2017-05-19 | Discharge: 2017-05-25 | DRG: 390 | Disposition: A | Discharge status: Home / Self Care | Payer: Medicare HMO | Attending: Family Medicine | Admitting: Family Medicine

## 2017-05-19 ENCOUNTER — Encounter (HOSPITAL_COMMUNITY): Payer: Self-pay | Admitting: Emergency Medicine

## 2017-05-19 DIAGNOSIS — Z888 Allergy status to other drugs, medicaments and biological substances status: Secondary | ICD-10-CM

## 2017-05-19 DIAGNOSIS — Z885 Allergy status to narcotic agent status: Secondary | ICD-10-CM

## 2017-05-19 DIAGNOSIS — F17210 Nicotine dependence, cigarettes, uncomplicated: Secondary | ICD-10-CM | POA: Diagnosis present

## 2017-05-19 DIAGNOSIS — Z0189 Encounter for other specified special examinations: Secondary | ICD-10-CM

## 2017-05-19 DIAGNOSIS — Z880 Allergy status to penicillin: Secondary | ICD-10-CM

## 2017-05-19 DIAGNOSIS — Z7902 Long term (current) use of antithrombotics/antiplatelets: Secondary | ICD-10-CM

## 2017-05-19 DIAGNOSIS — E739 Lactose intolerance, unspecified: Secondary | ICD-10-CM | POA: Diagnosis present

## 2017-05-19 DIAGNOSIS — Z79899 Other long term (current) drug therapy: Secondary | ICD-10-CM

## 2017-05-19 DIAGNOSIS — F419 Anxiety disorder, unspecified: Secondary | ICD-10-CM | POA: Diagnosis present

## 2017-05-19 DIAGNOSIS — I513 Intracardiac thrombosis, not elsewhere classified: Secondary | ICD-10-CM | POA: Diagnosis present

## 2017-05-19 DIAGNOSIS — K8020 Calculus of gallbladder without cholecystitis without obstruction: Secondary | ICD-10-CM | POA: Diagnosis present

## 2017-05-19 DIAGNOSIS — E876 Hypokalemia: Secondary | ICD-10-CM | POA: Diagnosis not present

## 2017-05-19 DIAGNOSIS — Z882 Allergy status to sulfonamides status: Secondary | ICD-10-CM

## 2017-05-19 DIAGNOSIS — R319 Hematuria, unspecified: Secondary | ICD-10-CM | POA: Diagnosis present

## 2017-05-19 DIAGNOSIS — K56600 Partial intestinal obstruction, unspecified as to cause: Secondary | ICD-10-CM | POA: Diagnosis not present

## 2017-05-19 DIAGNOSIS — K56609 Unspecified intestinal obstruction, unspecified as to partial versus complete obstruction: Secondary | ICD-10-CM | POA: Diagnosis not present

## 2017-05-19 DIAGNOSIS — F329 Major depressive disorder, single episode, unspecified: Secondary | ICD-10-CM | POA: Diagnosis present

## 2017-05-19 LAB — URINALYSIS, ROUTINE W REFLEX MICROSCOPIC
Bilirubin Urine: NEGATIVE
Glucose, UA: NEGATIVE mg/dL
Ketones, ur: NEGATIVE mg/dL
Nitrite: NEGATIVE
PH: 5.5 (ref 5.0–8.0)
PROTEIN: NEGATIVE mg/dL
Specific Gravity, Urine: 1.01 (ref 1.005–1.030)

## 2017-05-19 LAB — COMPREHENSIVE METABOLIC PANEL
ALT: 10 U/L — AB (ref 14–54)
AST: 16 U/L (ref 15–41)
Albumin: 4 g/dL (ref 3.5–5.0)
Alkaline Phosphatase: 62 U/L (ref 38–126)
Anion gap: 9 (ref 5–15)
BUN: 16 mg/dL (ref 6–20)
CHLORIDE: 104 mmol/L (ref 101–111)
CO2: 24 mmol/L (ref 22–32)
CREATININE: 0.92 mg/dL (ref 0.44–1.00)
Calcium: 9.8 mg/dL (ref 8.9–10.3)
GFR calc Af Amer: >60 mL/min (ref >60)
GFR calc non Af Amer: >60 mL/min (ref >60)
Glucose, Bld: 113 mg/dL — ABNORMAL HIGH (ref 65–99)
POTASSIUM: 3.4 mmol/L — AB (ref 3.5–5.1)
Sodium: 137 mmol/L (ref 135–145)
Total Bilirubin: 0.8 mg/dL (ref 0.3–1.2)
Total Protein: 7.2 g/dL (ref 6.5–8.1)

## 2017-05-19 LAB — CBC
HEMATOCRIT: 41.6 % (ref 36.0–46.0)
Hemoglobin: 13.7 g/dL (ref 12.0–15.0)
MCH: 28.7 pg (ref 26.0–34.0)
MCHC: 32.9 g/dL (ref 30.0–36.0)
MCV: 87 fL (ref 78.0–100.0)
PLATELETS: 303 10*3/uL (ref 150–400)
RBC: 4.78 MIL/uL (ref 3.87–5.11)
RDW: 13.3 % (ref 11.5–15.5)
WBC: 12.9 10*3/uL — ABNORMAL HIGH (ref 4.0–10.5)

## 2017-05-19 LAB — URINALYSIS, MICROSCOPIC (REFLEX)

## 2017-05-19 LAB — LIPASE, BLOOD: LIPASE: 37 U/L (ref 11–51)

## 2017-05-19 NOTE — ED Triage Notes (Signed)
Per eMS:  Patient presents to ED for sudden onset of mid-abdominal, sharp, pulsating pain with a hx and similar sensation to previous crohn's episodes.  Patient c/o 1 episode of emesis, and 1 episode of diarrhea.

## 2017-05-20 ENCOUNTER — Emergency Department (HOSPITAL_COMMUNITY): Payer: Medicare HMO

## 2017-05-20 ENCOUNTER — Inpatient Hospital Stay (HOSPITAL_COMMUNITY): Payer: Medicare HMO

## 2017-05-20 ENCOUNTER — Encounter (HOSPITAL_COMMUNITY): Payer: Self-pay | Admitting: Radiology

## 2017-05-20 DIAGNOSIS — K56609 Unspecified intestinal obstruction, unspecified as to partial versus complete obstruction: Secondary | ICD-10-CM | POA: Diagnosis present

## 2017-05-20 DIAGNOSIS — Z888 Allergy status to other drugs, medicaments and biological substances status: Secondary | ICD-10-CM | POA: Diagnosis not present

## 2017-05-20 DIAGNOSIS — Z882 Allergy status to sulfonamides status: Secondary | ICD-10-CM | POA: Diagnosis not present

## 2017-05-20 DIAGNOSIS — Z79899 Other long term (current) drug therapy: Secondary | ICD-10-CM | POA: Diagnosis not present

## 2017-05-20 DIAGNOSIS — K8020 Calculus of gallbladder without cholecystitis without obstruction: Secondary | ICD-10-CM | POA: Diagnosis present

## 2017-05-20 DIAGNOSIS — F17210 Nicotine dependence, cigarettes, uncomplicated: Secondary | ICD-10-CM | POA: Diagnosis present

## 2017-05-20 DIAGNOSIS — R319 Hematuria, unspecified: Secondary | ICD-10-CM | POA: Diagnosis present

## 2017-05-20 DIAGNOSIS — F329 Major depressive disorder, single episode, unspecified: Secondary | ICD-10-CM | POA: Diagnosis present

## 2017-05-20 DIAGNOSIS — Z0189 Encounter for other specified special examinations: Secondary | ICD-10-CM | POA: Diagnosis not present

## 2017-05-20 DIAGNOSIS — F419 Anxiety disorder, unspecified: Secondary | ICD-10-CM | POA: Diagnosis present

## 2017-05-20 DIAGNOSIS — I513 Intracardiac thrombosis, not elsewhere classified: Secondary | ICD-10-CM | POA: Diagnosis present

## 2017-05-20 DIAGNOSIS — K56600 Partial intestinal obstruction, unspecified as to cause: Secondary | ICD-10-CM | POA: Diagnosis present

## 2017-05-20 DIAGNOSIS — E739 Lactose intolerance, unspecified: Secondary | ICD-10-CM | POA: Diagnosis present

## 2017-05-20 DIAGNOSIS — Z885 Allergy status to narcotic agent status: Secondary | ICD-10-CM | POA: Diagnosis not present

## 2017-05-20 DIAGNOSIS — Z7902 Long term (current) use of antithrombotics/antiplatelets: Secondary | ICD-10-CM | POA: Diagnosis not present

## 2017-05-20 DIAGNOSIS — Z880 Allergy status to penicillin: Secondary | ICD-10-CM | POA: Diagnosis not present

## 2017-05-20 DIAGNOSIS — E876 Hypokalemia: Secondary | ICD-10-CM | POA: Diagnosis not present

## 2017-05-20 LAB — LACTIC ACID, PLASMA
LACTIC ACID, VENOUS: 1 mmol/L (ref 0.5–1.9)
LACTIC ACID, VENOUS: 1 mmol/L (ref 0.5–1.9)

## 2017-05-20 MED ORDER — HEPARIN SODIUM (PORCINE) 5000 UNIT/ML IJ SOLN
5000.0000 [IU] | Freq: Three times a day (TID) | INTRAMUSCULAR | Status: DC
  Administered 2017-05-20 – 2017-05-25 (×13): 5000 [IU] via SUBCUTANEOUS
  Filled 2017-05-20 (×13): qty 1

## 2017-05-20 MED ORDER — DEXTROSE-NACL 5-0.9 % IV SOLN: INTRAVENOUS | Status: DC

## 2017-05-20 MED ORDER — POTASSIUM CHLORIDE 2 MEQ/ML IV SOLN
INTRAVENOUS | Status: DC
  Filled 2017-05-20 (×2): qty 1000

## 2017-05-20 MED ORDER — BENZOCAINE 20 % MT AERO
INHALATION_SPRAY | Freq: Once | OROMUCOSAL | Status: AC
  Administered 2017-05-20: 17:00:00 via OROMUCOSAL
  Filled 2017-05-20: qty 57

## 2017-05-20 MED ORDER — HALOPERIDOL LACTATE 5 MG/ML IJ SOLN
0.5000 mg | Freq: Once | INTRAMUSCULAR | Status: AC
Start: 2017-05-20 — End: 2017-05-20
  Administered 2017-05-20: 0.5 mg via INTRAVENOUS
  Filled 2017-05-20: qty 1

## 2017-05-20 MED ORDER — KCL IN DEXTROSE-NACL 20-5-0.9 MEQ/L-%-% IV SOLN
INTRAVENOUS | Status: AC
  Administered 2017-05-20 – 2017-05-21 (×2): via INTRAVENOUS
  Filled 2017-05-20 (×3): qty 1000

## 2017-05-20 MED ORDER — SODIUM CHLORIDE 0.9 % IV BOLUS
1000.0000 mL | Freq: Once | INTRAVENOUS | Status: AC
  Administered 2017-05-20: 1000 mL via INTRAVENOUS

## 2017-05-20 MED ORDER — HYDROMORPHONE HCL 2 MG/ML IJ SOLN: 0.5000 mg | INTRAMUSCULAR | Status: DC | PRN

## 2017-05-20 MED ORDER — SODIUM CHLORIDE 0.9 % IV SOLN: Freq: Once | INTRAVENOUS | Status: DC

## 2017-05-20 MED ORDER — HYDROMORPHONE HCL 2 MG/ML IJ SOLN
0.5000 mg | Freq: Once | INTRAMUSCULAR | Status: AC
  Administered 2017-05-20: 0.5 mg via INTRAVENOUS
  Filled 2017-05-20: qty 1

## 2017-05-20 MED ORDER — DIATRIZOATE MEGLUMINE & SODIUM 66-10 % PO SOLN
90.0000 mL | Freq: Once | ORAL | Status: AC
  Administered 2017-05-20: 90 mL via NASOGASTRIC
  Filled 2017-05-20: qty 90

## 2017-05-20 MED ORDER — IOPAMIDOL (ISOVUE-300) INJECTION 61%
INTRAVENOUS | Status: AC
  Filled 2017-05-20: qty 100

## 2017-05-20 MED ORDER — IOPAMIDOL (ISOVUE-300) INJECTION 61%
100.0000 mL | Freq: Once | INTRAVENOUS | Status: AC | PRN
  Administered 2017-05-20: 100 mL via INTRAVENOUS

## 2017-05-20 MED ORDER — ONDANSETRON HCL 4 MG/2ML IJ SOLN
4.0000 mg | Freq: Four times a day (QID) | INTRAMUSCULAR | Status: DC | PRN
Start: 2017-05-20 — End: 2017-05-25
  Administered 2017-05-20 – 2017-05-23 (×4): 4 mg via INTRAVENOUS
  Filled 2017-05-20 (×4): qty 2

## 2017-05-20 MED ORDER — DIATRIZOATE MEGLUMINE & SODIUM 66-10 % PO SOLN
90.0000 mL | Freq: Once | ORAL | Status: DC
  Filled 2017-05-20: qty 90

## 2017-05-20 MED ORDER — HYDROMORPHONE HCL 1 MG/ML IJ SOLN
0.5000 mg | INTRAMUSCULAR | Status: DC | PRN
  Administered 2017-05-20 (×2): 0.5 mg via INTRAVENOUS
  Filled 2017-05-20 (×3): qty 0.5

## 2017-05-20 MED ORDER — ONDANSETRON HCL 4 MG/2ML IJ SOLN
4.0000 mg | Freq: Once | INTRAMUSCULAR | Status: AC
  Administered 2017-05-20: 4 mg via INTRAVENOUS
  Filled 2017-05-20: qty 2

## 2017-05-20 NOTE — ED Notes (Signed)
Patient had an IV in place but requested that it be removed; pt advised that if it is removed, she may require another when she is seen in the main ED by the provider. States that she understands.

## 2017-05-20 NOTE — Consult Note (Addendum)
Reason for Consult:SBO Referring Physician: Fleurette Bryan is an 67 y.o. female.  HPI: This is a 67 year old female who presents to the emergency room with what appears to be a small bowel obstruction.  I am seeing the patient in consultation I requested admission to the medical service as there is no clear-cut indication for surgical intervention  She reports a laparotomy at age 68 months for intestinal disease of uncertain cause.  She subsequently has had a tubal ligation and abdominal hysterectomy.  2 years ago she had a laparotomy in [facility] and states that she had a bowel obstruction, lysis of adhesions, but did not have any intestinal resection. That event is visible on "Care Everywhere".  She now presents with a 24-hour history of gradual onset abdominal pain, cramping and vomiting.  She has been constipated.  She had a small bowel movement this morning but still has some pain.  No fever chills or diarrhea.  CT scan shows dilated small bowel loops, some of which seem to have thickened wall.  No definite transition point.  Small amount of free fluid.  No swirling.  No signs of ischemia.  Thought to be likely due to early or partial SBO.  No mass or neoplasia seen.  No hernia seen.  Gallstones also noted.  Lab work shows WBC 12,900.  Hemoglobin 13.7.  Potassium 3.4.  Glucose 113.  Creatinine 0.92.  Normal LFTs.    Past Medical History:  Diagnosis Date  . Abnormality of gait 06/05/2014  . Anxiety   . Cluster headache   . Incontinence of urine   . Insomnia secondary to anxiety   . Lactose intolerance   . Memory difficulties 06/05/2014    Past Surgical History:  Procedure Laterality Date  . ABDOMINAL HYSTERECTOMY    . APPENDECTOMY      Family History  Problem Relation Age of Onset  . Transient ischemic attack Mother   . Cancer Mother        lung (smoker)  . Anxiety disorder Mother   . Cancer Father        prostate  . Hypertension Father   . Mental retardation  Sister   . Healthy Brother   . Healthy Brother   . Cancer Maternal Grandmother        leukemia    Social History:  reports that she has been smoking.  She has been smoking about 1.00 pack per day. She has never used smokeless tobacco. She reports that she does not drink alcohol or use drugs.  Allergies:  Allergies  Allergen Reactions  . Levaquin [Levofloxacin] Other (See Comments)    Per pt report.  Burning veins   . Penicillins Hives and Palpitations    Has patient had a PCN reaction causing immediate rash, facial/tongue/throat swelling, SOB or lightheadedness with hypotension: yes Has patient had a PCN reaction causing severe rash involving mucus membranes or skin necrosis: yes Has patient had a PCN reaction that required hospitalization: no Has patient had a PCN reaction occurring within the last 10 years: no If all of the above answers are "NO", then may proceed with Cephalosporin use.   . Codeine Hives and Other (See Comments)    sweating  . Demerol [Meperidine] Hives  . Morphine And Related Other (See Comments)    Hallucinations   . Sulfa Antibiotics Hives    Medications: I have reviewed the patient's current medications.  Results for orders placed or performed during the hospital encounter of 05/19/17 (from  the past 48 hour(s))  Urinalysis, Routine w reflex microscopic     Status: Abnormal   Collection Time: 05/19/17 10:21 PM  Result Value Ref Range   Color, Urine YELLOW YELLOW   APPearance CLEAR CLEAR   Specific Gravity, Urine 1.010 1.005 - 1.030   pH 5.5 5.0 - 8.0   Glucose, UA NEGATIVE NEGATIVE mg/dL   Hgb urine dipstick MODERATE (A) NEGATIVE   Bilirubin Urine NEGATIVE NEGATIVE   Ketones, ur NEGATIVE NEGATIVE mg/dL   Protein, ur NEGATIVE NEGATIVE mg/dL   Nitrite NEGATIVE NEGATIVE   Leukocytes, UA SMALL (A) NEGATIVE    Comment: Performed at [facility] [address] [place], [place] [address]  Urinalysis, Microscopic (reflex)     Status: Abnormal    Collection Time: 05/19/17 10:21 PM  Result Value Ref Range   RBC / HPF 0-5 0 - 5 RBC/hpf   WBC, UA 0-5 0 - 5 WBC/hpf   Bacteria, UA FEW (A) NONE SEEN   Squamous Epithelial / LPF 0-5 0 - 5   Urine-Other LESS THAN 10 mL OF URINE SUBMITTED     Comment: MICROSCOPIC EXAM PERFORMED ON UNCONCENTRATED URINE Performed at [facility], [place] [address], [place], [place] [address]   Lipase, blood     Status: None   Collection Time: 05/19/17 10:34 PM  Result Value Ref Range   Lipase 37 11 - 51 U/L    Comment: Performed at [facility] [address], [place], [place] [address]  Comprehensive metabolic panel     Status: Abnormal   Collection Time: 05/19/17 10:34 PM  Result Value Ref Range   Sodium 137 135 - 145 mmol/L   Potassium 3.4 (L) 3.5 - 5.1 mmol/L   Chloride 104 101 - 111 mmol/L   CO2 24 22 - 32 mmol/L   Glucose, Bld 113 (H) 65 - 99 mg/dL   BUN 16 6 - 20 mg/dL   Creatinine, Ser 0.92 0.44 - 1.00 mg/dL   Calcium 9.8 8.9 - 10.3 mg/dL   Total Protein 7.2 6.5 - 8.1 g/dL   Albumin 4.0 3.5 - 5.0 g/dL   AST 16 15 - 41 U/L   ALT 10 (L) 14 - 54 U/L   Alkaline Phosphatase 62 38 - 126 U/L   Total Bilirubin 0.8 0.3 - 1.2 mg/dL   GFR calc non Af Amer >60 >60 mL/min   GFR calc Af Amer >60 >60 mL/min    Comment: (NOTE) The eGFR has been calculated using the CKD EPI equation. This calculation has not been validated in all clinical situations. eGFR's persistently <60 mL/min signify possible Chronic Kidney Disease.    Anion gap 9 5 - 15    Comment: Performed at [facility] [address] [place], [place] [address]  CBC     Status: Abnormal   Collection Time: 05/19/17 10:34 PM  Result Value Ref Range   WBC 12.9 (H) 4.0 - 10.5 K/uL   RBC 4.78 3.87 - 5.11 MIL/uL   Hemoglobin 13.7 12.0 - 15.0 g/dL   HCT 41.6 36.0 - 46.0 %   MCV 87.0 78.0 - 100.0 fL   MCH 28.7 26.0 - 34.0 pg   MCHC 32.9 30.0 - 36.0 g/dL   RDW 13.3 11.5 - 15.5 %   Platelets 303 150 - 400 K/uL    Comment:  Performed at [facility], [place] [address] [place], [place] [address]    Ct Abdomen Pelvis W Contrast  Result Date: 05/20/2017 CLINICAL DATA:  Centralized abdominal pain with nausea and vomiting 2 days. EXAM: CT ABDOMEN AND PELVIS WITH CONTRAST TECHNIQUE: Multidetector CT imaging of the abdomen and pelvis was performed using the standard protocol following bolus administration of intravenous contrast. CONTRAST:  167m ISOVUE-300 IOPAMIDOL (ISOVUE-300) INJECTION 61% COMPARISON:  11/08/2014 FINDINGS: Lower chest: Lung bases are within normal. Hepatobiliary: There are a few subcentimeter hypodensities within the liver unchanged and too small to characterize but likely cysts. Evidence of cholelithiasis with single 8-9 mm gallstone. Biliary tree is within normal. Pancreas: Normal. Spleen: Normal. Adrenals/Urinary Tract: Adrenal glands are normal. Kidneys normal size without hydronephrosis or nephrolithiasis. Subcentimeter hypodensity over the lower pole cortex of the left kidney too small to characterize but likely a cyst. Ureters and bladder are normal. Stomach/Bowel: Stomach is normal. There are several prominent small bowel loops within the mid to lower abdomen with fecalization of some of the small bowel loops some which are mildly dilated over the lower abdomen measuring up to 3.7 cm in diameter. There are a few thick-walled adjacent small bowel loops in the left mid to lower abdomen. There is mild stranding of the adjacent fat with patchy free fluid adjacent the small bowel loops in the mid to lower abdomen. No definite transition point is identified. Air is present throughout the colon. Previous appendectomy. No free peritoneal air. Vascular/Lymphatic: Mild calcified plaque over the abdominal aorta and iliac arteries. No adenopathy. Reproductive: Previous hysterectomy. Other: None. Musculoskeletal: Degenerate change of the spine with disc space narrowing at the L4-5 level. IMPRESSION: Dilated small  bowel loops over the central mid to lower abdomen some of which have thickened wall. No definite transition point visualized. Mild adjacent inflammatory change and free fluid. Findings likely due to early/partial small bowel obstruction and likely secondary to adhesions. Few small hypodensities within the liver too small to characterize but likely cysts and unchanged. Cholelithiasis. Aortic Atherosclerosis (ICD10-I70.0). Electronically Signed   By: DMarin OlpM.D.   On: 05/20/2017 11:21    ROS Blood pressure 108/72, pulse 75, temperature 98 F (36.7 C), temperature source Oral, resp. rate 18, SpO2 99 %.   Physical Exam General: Alert.  Appears tired but in no acute distress from pain.  Cooperative.  Borderline deconditioned HEENT.  Normocephalic.  Sclera clear. Neck: Supple.  Nontender.  Trachea midline Cardiovascular: Regular rate and rhythm.  No murmur or ectopy.  Radial and femoral pulses intact Lungs: Clear to auscultation.  No rhonchi or wheeze Abdomen: Soft.  Hypoactive bowel sounds.  Mild tenderness but no real guarding or rebound.  Well-healed midline incision.  Well-healed right paramedian incision.  No hernia.  No mass.  No organomegaly. Musculoskeletal.  Moves all 4 extremities to command.  No edema. Neurologic: Alert.  Oriented x4.  Moves all 4 extremities to command psychiatric: Normal mood and affect. Skin: Warm and dry.  Brisk capillary refill   Assessment/Plan: 1)   SBO.  Ileus possible but less likely.  No evidence of bowel compromise.  - No indication for acute surgical intervention. -Admit to medicine. -Medical management -Recommend NG tube and small bowel protocol as ordered -Lab work tomorrow -IV fluids  2)   history laparotomy as [id number].  Procedure unknown.  May have had a atresias.  May have had pyloromyotomy.  3)   history TAH  4)  History laparotomy and lysis of adhesions for SBO  in [place] 2 years ago. Dr. PaPhineas Douglas5)  tobacco abuse  We will  follow.  Adin Hector 05/20/2017, 1:49 PM

## 2017-05-20 NOTE — ED Notes (Signed)
Pt called for vitals. No answer.  

## 2017-05-20 NOTE — Progress Notes (Signed)
Rachel Bryan [id number]  Code Status: FULL  Admission Data: 05/20/2017 7:41 PM  Attending Provider: Nori Riis  SWN:IOEVOJ, Earna Coder, MD  Consults/ Treatment Team: Treatment Team:  Edison Pace, Md, MD  Rachel Bryan is a 67 y.o. female patient admitted from ED awake, alert - oriented X 4 - no acute distress noted. VSS - Blood pressure (!) 148/63, pulse 79, temperature 98 F (36.7 C), temperature source Oral, resp. rate 20, SpO2 96 %. No c/o shortness of breath, no c/o chest pain. NG tube in place and on intermittent suctioning.  nd floor completed with information packet given to patient. Patient declined safety video at this time. Admission INP armband ID verified with patient, and in place.  Fall assessment complete, with patient and family able to verbalize understanding of risk associated with falls, and verbalized understanding to call nsg before up out of bed. Call light within reach, patient able to voice, and demonstrate understanding. Skin and dry.  ?  Will cont to eval and treat per MD orders.  Melonie Florida, RN  05/20/2017 7:41 PM

## 2017-05-20 NOTE — ED Provider Notes (Signed)
[facility] EMERGENCY DEPARTMENT Provider Note   CSN: [id number] Arrival date & time: 05/19/17  2215     History   Chief Complaint Chief Complaint  Patient presents with  . Abdominal Pain    HPI Rachel Bryan is a 67 y.o. female.  HPI 67 year old female with past medical history as below including history of appendectomy, bowel obstruction requiring surgery, here with abdominal pain.  The patient states her symptoms started gradually yesterday.  She endorses diffuse, aching, throbbing, cramp-like pain.  She had associated nausea and profuse vomiting overnight.  She also had constipation and took multiple stool softeners and laxatives.  She is able to have a small bowel movement this morning, but states she has persistent diffuse abdominal pain.  She is also noticed abdominal swelling.  She is persistently nauseous.  Denies any fevers.  No chills.  No urinary symptoms.  Symptoms feel similar to her previous bowel obstruction.  No alleviating factors.  Symptoms are worse with any attempted eating or palpation.  Past Medical History:  Diagnosis Date  . Abnormality of gait 06/05/2014  . Anxiety   . Cluster headache   . Incontinence of urine   . Insomnia secondary to anxiety   . Lactose intolerance   . Memory difficulties 06/05/2014    Patient Active Problem List   Diagnosis Date Noted  . Screening for cervical cancer 03/31/2016  . 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 Acute bronchitis 07/10/2014  . Memory difficulties 06/05/2014  . Tremor 06/05/2014  . Abnormality of gait 06/05/2014  . Malnutrition of moderate degree ([place]) 08/29/2013  . Complicated migraine 32/99/2426  . Urinary incontinence 12/25/2012  . Falls 09/13/2012  . Depression 02/05/2012  . Dermoid cyst of arm 01/15/2012  . Anxiety 01/12/2012  . Insomnia 01/12/2012    Past Surgical History:  Procedure Laterality Date  . ABDOMINAL HYSTERECTOMY    . APPENDECTOMY       OB History   None      Home Medications    Prior to Admission medications   Medication Sig Start Date End Date Taking? Authorizing Provider  acetaminophen (TYLENOL) 325 MG tablet Take 650 mg by mouth every 6 (six) hours as needed for mild pain.   Yes [provider]  albuterol (PROVENTIL HFA;VENTOLIN HFA) 108 (90 Base) MCG/ACT inhaler Inhale 2 puffs into the lungs every 6 (six) hours as needed for wheezing or shortness of breath. 03/22/17  Yes Mercy Riding, MD  citalopram (CELEXA) 20 MG tablet take 1 tablet by mouth once daily 08/14/16  Yes Nicolette Bang, DO  clopidogrel (PLAVIX) 75 MG tablet Take 75 mg by mouth daily. 11/06/14  Yes [provider]  ibuprofen (ADVIL,MOTRIN) 600 MG tablet Take 1 tablet (600 mg total) by mouth every 8 (eight) hours as needed. Patient taking differently: Take 600 mg by mouth every 8 (eight) hours as needed for moderate pain.  11/19/14  Yes Mariel Aloe, MD  Incontinence Supply Disposable (ATTENDS BREATHABLE BRIEFS MED) MISC 1 application by Does not apply route daily as needed. 09/08/14  Yes Mariel Aloe, MD  topiramate (TOPAMAX) 25 MG tablet Take 4 tablets (100 mg total) by mouth daily. 03/28/16  Yes Nicolette Bang, DO  traZODone (DESYREL) 50 MG tablet take 1/2 to 1 tablets by mouth at bedtime if needed for sleep Patient taking differently: Take 50-100 mg by mouth at bedtime as needed for sleep.  11/23/16  Yes Eloise Levels, MD  vitamin B-12 (CYANOCOBALAMIN)  1000 MCG tablet Take 1,000 mcg by mouth daily.   Yes [provider]  baclofen (LIORESAL) 10 MG tablet take 1 tablet by mouth three times a day if needed Patient not taking: Reported on 05/20/2017 03/28/16   Nicolette Bang, DO  minoxidil (MINOXIDIL FOR WOMEN) 2 % external solution Apply topically 2 (two) times daily. Patient not taking: Reported on 05/20/2017 09/08/14   Mariel Aloe, MD  predniSONE (DELTASONE) 50 MG tablet Take 1 tablet (50 mg total) by mouth daily  with breakfast. Patient not taking: Reported on 05/20/2017 03/22/17   Mercy Riding, MD  promethazine (PHENERGAN) 25 MG tablet Take 1 tablet (25 mg total) by mouth every 6 (six) hours as needed for nausea or vomiting. Patient not taking: Reported on 05/20/2017 04/27/14   Noemi Chapel, MD    Family History Family History  Problem Relation Age of Onset  . Transient ischemic attack Mother   . Cancer Mother        lung (smoker)  . Anxiety disorder Mother   . Cancer Father        prostate  . Hypertension Father   . Mental retardation Sister   . Healthy Brother   . Healthy Brother   . Cancer Maternal Grandmother        leukemia    Social History Social History   Tobacco Use  . Smoking status: Current Every Day Smoker    Packs/day: 1.00  . Smokeless tobacco: Never Used  Substance Use Topics  . Alcohol use: No    Alcohol/week: 0.0 oz  . Drug use: No     Allergies   Levaquin [levofloxacin]; Penicillins; Codeine; Demerol [meperidine]; Morphine and related; and Sulfa antibiotics   Review of Systems Review of Systems  Constitutional: Positive for fatigue. Negative for chills and fever.  HENT: Negative for congestion and rhinorrhea.   Eyes: Negative for visual disturbance.  Respiratory: Negative for cough, shortness of breath and wheezing.   Cardiovascular: Negative for chest pain and leg swelling.  Gastrointestinal: Positive for abdominal distention, abdominal pain, nausea and vomiting. Negative for diarrhea.  Genitourinary: Negative for dysuria and flank pain.  Musculoskeletal: Negative for neck pain and neck stiffness.  Skin: Negative for rash and wound.  Allergic/Immunologic: Negative for immunocompromised state.  Neurological: Negative for syncope, weakness and headaches.  All other systems reviewed and are negative.    Physical Exam Updated Vital Signs BP 108/72   Pulse 75   Temp 98 F (36.7 C) (Oral)   Resp 18   SpO2 99%   Physical Exam  Constitutional: She  is oriented to person, place, and time. She appears well-developed and well-nourished. No distress.  HENT:  Head: Normocephalic and atraumatic.  Eyes: Conjunctivae are normal.  Neck: Neck supple.  Cardiovascular: Normal rate, regular rhythm and normal heart sounds. Exam reveals no friction rub.  No murmur heard. Pulmonary/Chest: Effort normal and breath sounds normal. No respiratory distress. She has no wheezes. She has no rales.  Abdominal: Soft. Normal appearance. She exhibits distension. There is generalized tenderness. There is rebound and guarding. There is no rigidity.  Musculoskeletal: She exhibits no edema.  Neurological: She is alert and oriented to person, place, and time. She exhibits normal muscle tone.  Skin: Skin is warm. Capillary refill takes less than 2 seconds.  Psychiatric: She has a normal mood and affect.  Nursing note and vitals reviewed.    ED Treatments / Results  Labs (all labs ordered are listed, but only abnormal results  are displayed) Labs Reviewed  COMPREHENSIVE METABOLIC PANEL - Abnormal; Notable for the following components:      Result Value   Potassium 3.4 (*)    Glucose, Bld 113 (*)    ALT 10 (*)    All other components within normal limits  CBC - Abnormal; Notable for the following components:   WBC 12.9 (*)    All other components within normal limits  URINALYSIS, ROUTINE W REFLEX MICROSCOPIC - Abnormal; Notable for the following components:   Hgb urine dipstick MODERATE (*)    Leukocytes, UA SMALL (*)    All other components within normal limits  URINALYSIS, MICROSCOPIC (REFLEX) - Abnormal; Notable for the following components:   Bacteria, UA FEW (*)    All other components within normal limits  LIPASE, BLOOD    EKG None  Radiology Ct Abdomen Pelvis W Contrast  Result Date: 05/20/2017 CLINICAL DATA:  Centralized abdominal pain with nausea and vomiting 2 days. EXAM: CT ABDOMEN AND PELVIS WITH CONTRAST TECHNIQUE: Multidetector CT  imaging of the abdomen and pelvis was performed using the standard protocol following bolus administration of intravenous contrast. CONTRAST:  131mL ISOVUE-300 IOPAMIDOL (ISOVUE-300) INJECTION 61% COMPARISON:  11/08/2014 FINDINGS: Lower chest: Lung bases are within normal. Hepatobiliary: There are a few subcentimeter hypodensities within the liver unchanged and too small to characterize but likely cysts. Evidence of cholelithiasis with single 8-9 mm gallstone. Biliary tree is within normal. Pancreas: Normal. Spleen: Normal. Adrenals/Urinary Tract: Adrenal glands are normal. Kidneys normal size without hydronephrosis or nephrolithiasis. Subcentimeter hypodensity over the lower pole cortex of the left kidney too small to characterize but likely a cyst. Ureters and bladder are normal. Stomach/Bowel: Stomach is normal. There are several prominent small bowel loops within the mid to lower abdomen with fecalization of some of the small bowel loops some which are mildly dilated over the lower abdomen measuring up to 3.7 cm in diameter. There are a few thick-walled adjacent small bowel loops in the left mid to lower abdomen. There is mild stranding of the adjacent fat with patchy free fluid adjacent the small bowel loops in the mid to lower abdomen. No definite transition point is identified. Air is present throughout the colon. Previous appendectomy. No free peritoneal air. Vascular/Lymphatic: Mild calcified plaque over the abdominal aorta and iliac arteries. No adenopathy. Reproductive: Previous hysterectomy. Other: None. Musculoskeletal: Degenerate change of the spine with disc space narrowing at the L4-5 level. IMPRESSION: Dilated small bowel loops over the central mid to lower abdomen some of which have thickened wall. No definite transition point visualized. Mild adjacent inflammatory change and free fluid. Findings likely due to early/partial small bowel obstruction and likely secondary to adhesions. Few small  hypodensities within the liver too small to characterize but likely cysts and unchanged. Cholelithiasis. Aortic Atherosclerosis (ICD10-I70.0). Electronically Signed   By: Marin Olp M.D.   On: 05/20/2017 11:21    Procedures Procedures (including critical care time)  Medications Ordered in ED Medications  iopamidol (ISOVUE-300) 61 % injection (has no administration in time range)  Benzocaine (HURRCAINE) 20 % mouth spray (has no administration in time range)  sodium chloride 0.9 % bolus 1,000 mL (0 mLs Intravenous Stopped 05/20/17 1157)  ondansetron (ZOFRAN) injection 4 mg (4 mg Intravenous Given 05/20/17 0848)  haloperidol lactate (HALDOL) injection 0.5 mg (0.5 mg Intravenous Given 05/20/17 0848)  iopamidol (ISOVUE-300) 61 % injection 100 mL (100 mLs Intravenous Contrast Given 05/20/17 1024)  HYDROmorphone (DILAUDID) injection 0.5 mg (0.5 mg Intravenous Given 05/20/17 1158)  Initial Impression / Assessment and Plan / ED Course  I have reviewed the triage vital signs and the nursing notes.  Pertinent labs & imaging results that were available during my care of the patient were reviewed by me and considered in my medical decision making (see chart for details).  Clinical Course as of May 20 1305  Sun May 20, 2017  0920 Given patient's poor reaction to analgesics as well as previous antiemetics.  A very low dose of Haldol was given the patient responded very well.  Her pain and nausea is improving.  She has no arrhythmia.   [CI]    Clinical Course User Index [CI] Duffy Bruce, MD    67 year old female here with diffuse abdominal pain and distention.  She had vomiting at home but is not vomiting here.  Imaging is consistent with partial small bowel obstruction.  She has mild leukocytosis.  Renal function is at baseline.  She had marked improvement in her nausea here, but persistent pain.  Will plan to admit.  Patient would like to hold on NG tube which in the absence of any stomach  distention on scan as well as no vomiting, is reasonable.  Will discuss with surgery.   Dr. Rosendo Gros has been consulted. Pt nauseous so NGT placed. Will admit to medicine.  Final Clinical Impressions(s) / ED Diagnoses   Final diagnoses:  SBO (small bowel obstruction) [facility])    ED Discharge Orders    None       Duffy Bruce, MD 05/20/17 [phone]

## 2017-05-20 NOTE — H&P (Signed)
[facility] Admission History and Physical Service Pager: [phone]  Patient name: Rachel Bryan Medical record number: [id number] Date of birth: Oct 04, 1950 Age: 67 y.o. Gender: female  Primary Care Provider: Marjie Skiff, MD Consultants: surgery Code Status: full  Chief Complaint: abdominal pain, nausea, vomiting  Assessment and Plan: Rachel Bryan is a 67 y.o. female presenting with nausea, vomiting, and abdominal pain consistent with small bowel obstruction. PMH is significant for previous small bowel obstruction, CAD, depression/anxiety, tobacco use.   Small Bowel Obstruction:  Patient's symptoms consistent with partial SBO, which was confirmed with CT scan showing dilatation and fecalization of the small intestine.  Vitals, CBC were wnl, CMP only notable for potassium of 3.4.  Surgery was consulted and recommended an NG tube with the hope to avoid surgery.  Received 1L NS bolus and was given dilaudid and zofran for pain and nausea in the ED with improvement in these symptoms. - admit to med-surg, attending Dr. Nori Riis - appreciate surgery recommendations - D5NS+20 mEq KCl at 100 ml/hr - NPO with NG tube  - serial abdominal exams - zofran 4 mg IV Q6H PRN for nausea - dilaudid 0.5 mg IV Q3H PRN for pain  Conjunctival Injection in R eye: This is a new symptom that was noticed by a family member today.  Patient denies grittiness or pain and declines eye drops at this time. 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    Depression/Anxiety: Takes Celexa 20 mg daily and trazodone 50-100 mg QHS. - hold medications while NPO  Tobacco use: Smokes 1 ppd, but declines nicotine patch because her doctor told her it was bad for her heart. - nicotine patch on patient request  FEN/GI: NPO Prophylaxis: heparin  Disposition: admit to med-surg  History of Present Illness:  Rachel Bryan is a 67 y.o.  female presenting with abdominal pain, nausea, and vomiting starting yesterday. She states this same thing happened to her two years ago. She had to have an emergency surgery at that time to remove adhesions.  Her abdominal pain is constant. The pain is located around her belly button. She also notes a lot of bloating. She states that her abdominal pain and nausea are better since getting medications in the ED. She last ate yesterday when she had a half of a hamburger. She has been able to drink fluids without a problem.  Her last BM was this morning and she states it was normal.   Review Of Systems: Per HPI with the following additions: see below  Review of Systems  Constitutional: Negative for chills, fever and weight loss.  HENT: Negative for congestion.   Respiratory: Negative for cough and shortness of breath.   Cardiovascular: Negative for chest pain.  Gastrointestinal: Positive for abdominal pain and nausea. Negative for blood in stool, constipation, diarrhea, melena and vomiting.  Genitourinary: Negative for dysuria and frequency.  Musculoskeletal: Negative for falls.  Neurological: Negative for weakness.  Endo/Heme/Allergies: Does not bruise/bleed easily.  Psychiatric/Behavioral: Negative for depression. The patient is not nervous/anxious.     Patient Active Problem List   Diagnosis Date Noted  . Screening for cervical cancer 03/31/2016  . 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 Acute bronchitis 07/10/2014  . Memory difficulties 06/05/2014  . Tremor 06/05/2014  . Abnormality of gait 06/05/2014  . Malnutrition of moderate degree ([place]) 08/29/2013  . Complicated migraine 54/62/7035  . Urinary incontinence 12/25/2012  .  Falls 09/13/2012  . Depression 02/05/2012  . Dermoid cyst of arm 01/15/2012  . Anxiety 01/12/2012  . Insomnia 01/12/2012    Past Medical History: Past Medical History:  Diagnosis Date  . Abnormality of gait 06/05/2014  . Anxiety   . Cluster headache   .  Incontinence of urine   . Insomnia secondary to anxiety   . Lactose intolerance   . Memory difficulties 06/05/2014    Past Surgical History: Past Surgical History:  Procedure Laterality Date  . ABDOMINAL HYSTERECTOMY    . APPENDECTOMY      Social History: Social History   Tobacco Use  . Smoking status: Current Every Day Smoker    Packs/day: 1.00  . Smokeless tobacco: Never Used  Substance Use Topics  . Alcohol use: No    Alcohol/week: 0.0 oz  . Drug use: No   Additional social history: smokes 1 ppd x 50 years Please also refer to relevant sections of EMR.  Family History: Family History  Problem Relation Age of Onset  . Transient ischemic attack Mother   . Cancer Mother        lung (smoker)  . Anxiety disorder Mother   . Cancer Father        prostate  . Hypertension Father   . Mental retardation Sister   . Healthy Brother   . Healthy Brother   . Cancer Maternal Grandmother        leukemia    Allergies and Medications: Allergies  Allergen Reactions  . Levaquin [Levofloxacin] Other (See Comments)    Per pt report.  Burning veins   . Penicillins Hives and Palpitations    Has patient had a PCN reaction causing immediate rash, facial/tongue/throat swelling, SOB or lightheadedness with hypotension: yes Has patient had a PCN reaction causing severe rash involving mucus membranes or skin necrosis: yes Has patient had a PCN reaction that required hospitalization: no Has patient had a PCN reaction occurring within the last 10 years: no If all of the above answers are "NO", then may proceed with Cephalosporin use.   . Codeine Hives and Other (See Comments)    sweating  . Demerol [Meperidine] Hives  . Morphine And Related Other (See Comments)    Hallucinations   . Sulfa Antibiotics Hives   No current facility-administered medications on file prior to encounter.    Current Outpatient Medications on File Prior to Encounter  Medication Sig Dispense Refill  .  acetaminophen (TYLENOL) 325 MG tablet Take 650 mg by mouth every 6 (six) hours as needed for mild pain.    Marland Kitchen albuterol (PROVENTIL HFA;VENTOLIN HFA) 108 (90 Base) MCG/ACT inhaler Inhale 2 puffs into the lungs every 6 (six) hours as needed for wheezing or shortness of breath. 1 Inhaler 0  . citalopram (CELEXA) 20 MG tablet take 1 tablet by mouth once daily 30 tablet 3  . clopidogrel (PLAVIX) 75 MG tablet Take 75 mg by mouth daily.    Marland Kitchen ibuprofen (ADVIL,MOTRIN) 600 MG tablet Take 1 tablet (600 mg total) by mouth every 8 (eight) hours as needed. (Patient taking differently: Take 600 mg by mouth every 8 (eight) hours as needed for moderate pain. ) 15 tablet 0  . Incontinence Supply Disposable (ATTENDS BREATHABLE BRIEFS MED) MISC 1 application by Does not apply route daily as needed. 24 each 5  . topiramate (TOPAMAX) 25 MG tablet Take 4 tablets (100 mg total) by mouth daily. 120 tablet 5  . traZODone (DESYREL) 50 MG tablet take 1/2 to 1  tablets by mouth at bedtime if needed for sleep (Patient taking differently: Take 50-100 mg by mouth at bedtime as needed for sleep. ) 30 tablet 3  . vitamin B-12 (CYANOCOBALAMIN) 1000 MCG tablet Take 1,000 mcg by mouth daily.    . baclofen (LIORESAL) 10 MG tablet take 1 tablet by mouth three times a day if needed (Patient not taking: Reported on 05/20/2017) 30 tablet 0  . minoxidil (MINOXIDIL FOR WOMEN) 2 % external solution Apply topically 2 (two) times daily. (Patient not taking: Reported on 05/20/2017) 60 mL 0  . predniSONE (DELTASONE) 50 MG tablet Take 1 tablet (50 mg total) by mouth daily with breakfast. (Patient not taking: Reported on 05/20/2017) 5 tablet 0  . promethazine (PHENERGAN) 25 MG tablet Take 1 tablet (25 mg total) by mouth every 6 (six) hours as needed for nausea or vomiting. (Patient not taking: Reported on 05/20/2017) 12 tablet 0    Objective: BP 125/70   Pulse 73   Temp 98 F (36.7 C) (Oral)   Resp 18   SpO2 96%  Exam: Physical Exam   Constitutional: She is oriented to person, place, and time.  Thin, elderly appearing female in NAD  HENT:  Head: Normocephalic and atraumatic.  Poor dentition  Eyes: Pupils are equal, round, and reactive to light. EOM are normal.  Conjunctival injection in outer R eye  Cardiovascular: Normal rate, regular rhythm, normal heart sounds and intact distal pulses.  Pulmonary/Chest: Effort normal and breath sounds normal.  Abdominal: Soft. She exhibits distension. Bowel sounds are increased. There is tenderness in the periumbilical area.  Two vertical abdominal scars visualized  Neurological: She is alert and oriented to person, place, and time.  Skin: Skin is warm and dry.  Psychiatric: She has a normal mood and affect. Her behavior is normal.     Labs and Imaging: CBC BMET  Recent Labs  Lab 05/19/17 2234  WBC 12.9*  HGB 13.7  HCT 41.6  PLT 303   Recent Labs  Lab 05/19/17 2234  NA 137  K 3.4*  CL 104  CO2 24  BUN 16  CREATININE 0.92  GLUCOSE 113*  CALCIUM 9.8     Ct Abdomen Pelvis W Contrast  Result Date: 05/20/2017 CLINICAL DATA:  Centralized abdominal pain with nausea and vomiting 2 days. EXAM: CT ABDOMEN AND PELVIS WITH CONTRAST TECHNIQUE: Multidetector CT imaging of the abdomen and pelvis was performed using the standard protocol following bolus administration of intravenous contrast. CONTRAST:  146mL ISOVUE-300 IOPAMIDOL (ISOVUE-300) INJECTION 61% COMPARISON:  11/08/2014 FINDINGS: Lower chest: Lung bases are within normal. Hepatobiliary: There are a few subcentimeter hypodensities within the liver unchanged and too small to characterize but likely cysts. Evidence of cholelithiasis with single 8-9 mm gallstone. Biliary tree is within normal. Pancreas: Normal. Spleen: Normal. Adrenals/Urinary Tract: Adrenal glands are normal. Kidneys normal size without hydronephrosis or nephrolithiasis. Subcentimeter hypodensity over the lower pole cortex of the left kidney too small to  characterize but likely a cyst. Ureters and bladder are normal. Stomach/Bowel: Stomach is normal. There are several prominent small bowel loops within the mid to lower abdomen with fecalization of some of the small bowel loops some which are mildly dilated over the lower abdomen measuring up to 3.7 cm in diameter. There are a few thick-walled adjacent small bowel loops in the left mid to lower abdomen. There is mild stranding of the adjacent fat with patchy free fluid adjacent the small bowel loops in the mid to lower abdomen. No definite transition  point is identified. Air is present throughout the colon. Previous appendectomy. No free peritoneal air. Vascular/Lymphatic: Mild calcified plaque over the abdominal aorta and iliac arteries. No adenopathy. Reproductive: Previous hysterectomy. Other: None. Musculoskeletal: Degenerate change of the spine with disc space narrowing at the L4-5 level. IMPRESSION: Dilated small bowel loops over the central mid to lower abdomen some of which have thickened wall. No definite transition point visualized. Mild adjacent inflammatory change and free fluid. Findings likely due to early/partial small bowel obstruction and likely secondary to adhesions. Few small hypodensities within the liver too small to characterize but likely cysts and unchanged. Cholelithiasis. Aortic Atherosclerosis (ICD10-I70.0). Electronically Signed   By: Marin Olp M.D.   On: 05/20/2017 11:21   Dg Abd Portable 1v-small Bowel Protocol-position Verification  Result Date: 05/20/2017 CLINICAL DATA:  Nasogastric tube placement. EXAM: PORTABLE ABDOMEN - 1 VIEW COMPARISON:  CT, 05/20/2017. FINDINGS: Nasogastric tube passes below the diaphragm into the mid stomach. Normal bowel gas pattern. Residual contrast noted in the bladder. Gallstone noted in the right upper quadrant. IMPRESSION: Well-positioned nasogastric tube. Electronically Signed   By: Lajean Manes M.D.   On: 05/20/2017 17:45     Kathrene Alu, MD 05/20/2017, 2:33 PM PGY-1, [place] Intern pager: [phone], text pages welcome

## 2017-05-21 ENCOUNTER — Inpatient Hospital Stay (HOSPITAL_COMMUNITY): Payer: Medicare HMO

## 2017-05-21 LAB — BASIC METABOLIC PANEL
ANION GAP: 4 — AB (ref 5–15)
BUN: 16 mg/dL (ref 6–20)
CO2: 27 mmol/L (ref 22–32)
Calcium: 8.6 mg/dL — ABNORMAL LOW (ref 8.9–10.3)
Chloride: 113 mmol/L — ABNORMAL HIGH (ref 101–111)
Creatinine, Ser: 0.77 mg/dL (ref 0.44–1.00)
GFR calc non Af Amer: >60 mL/min (ref >60)
GLUCOSE: 151 mg/dL — AB (ref 65–99)
POTASSIUM: 3.8 mmol/L (ref 3.5–5.1)
Sodium: 144 mmol/L (ref 135–145)

## 2017-05-21 LAB — CBC
HEMATOCRIT: 38.5 % (ref 36.0–46.0)
Hemoglobin: 12.2 g/dL (ref 12.0–15.0)
MCH: 28.5 pg (ref 26.0–34.0)
MCHC: 31.7 g/dL (ref 30.0–36.0)
MCV: 90 fL (ref 78.0–100.0)
PLATELETS: 255 10*3/uL (ref 150–400)
RBC: 4.28 MIL/uL (ref 3.87–5.11)
RDW: 13.7 % (ref 11.5–15.5)
WBC: 8.5 10*3/uL (ref 4.0–10.5)

## 2017-05-21 LAB — HIV ANTIBODY (ROUTINE TESTING W REFLEX): HIV SCREEN 4TH GENERATION: NONREACTIVE

## 2017-05-21 MED ORDER — POLYETHYLENE GLYCOL 3350 17 G PO PACK
17.0000 g | PACK | Freq: Every day | ORAL | Status: DC
  Administered 2017-05-21: 17 g via ORAL
  Filled 2017-05-21: qty 1

## 2017-05-21 MED ORDER — TOPIRAMATE 100 MG PO TABS: 100.0000 mg | ORAL_TABLET | Freq: Every day | ORAL | Status: DC

## 2017-05-21 MED ORDER — TRAMADOL HCL 50 MG PO TABS
50.0000 mg | ORAL_TABLET | Freq: Four times a day (QID) | ORAL | Status: DC | PRN
  Filled 2017-05-21: qty 1

## 2017-05-21 MED ORDER — FENTANYL CITRATE (PF) 100 MCG/2ML IJ SOLN
25.0000 ug | INTRAMUSCULAR | Status: DC | PRN
  Administered 2017-05-21 – 2017-05-22 (×5): 25 ug via INTRAVENOUS
  Filled 2017-05-21 (×5): qty 2

## 2017-05-21 MED ORDER — CITALOPRAM HYDROBROMIDE 20 MG PO TABS: 20.0000 mg | ORAL_TABLET | Freq: Every day | ORAL | Status: DC

## 2017-05-21 MED ORDER — BISACODYL 10 MG RE SUPP: 10.0000 mg | Freq: Every day | RECTAL | Status: DC | PRN

## 2017-05-21 MED ORDER — DOCUSATE SODIUM 100 MG PO CAPS
100.0000 mg | ORAL_CAPSULE | Freq: Two times a day (BID) | ORAL | Status: DC
  Administered 2017-05-21: 100 mg via ORAL
  Filled 2017-05-21: qty 1

## 2017-05-21 MED ORDER — HYDROMORPHONE HCL 1 MG/ML IJ SOLN: 0.5000 mg | INTRAMUSCULAR | Status: DC | PRN

## 2017-05-21 NOTE — Progress Notes (Signed)
[facility] Progress Note     Subjective: CC: SBO Patient reports she had a bowel movement this AM, small. Not passing much flatus. Feels like she needs to have another but has not been out of bed. Wants to get up and move around. Did have some nausea and emesis this AM as well. Not a lot of output from NGT. Abdominal distention improved, no pain currently. Wants some black coffee. VSS.   Objective: Vital signs in last 24 hours: Temp:  [98.3 F (36.8 C)-99 F (37.2 C)] 99 F (37.2 C) (04/29 0519) Pulse Rate:  [68-84] 81 (04/29 0519) Resp:  [16-20] 16 (04/29 0519) BP: (89-148)/(54-90) 132/71 (04/29 0519) SpO2:  [92 %-100 %] 95 % (04/28 2250) Last BM Date: 05/21/17  Intake/Output from previous day: 04/28 0701 - 04/29 0700 In: 2336.7 [I.V.:1336.7; IV Piggyback:1000] Out: 600 [Emesis/NG output:600] Intake/Output this shift: No intake/output data recorded.  PE: Gen:  Alert, NAD, pleasant Card:  Regular rate and rhythm, pedal pulses 2+ BL Pulm:  Normal effort, clear to auscultation bilaterally Abd: Soft, non-tender, non-distended, bowel sounds hypoactive, no HSM, surgical scars Skin: warm and dry, no rashes  Psych: A&Ox3   Lab Results:  Recent Labs    05/19/17 2234 05/21/17 0416  WBC 12.9* 8.5  HGB 13.7 12.2  HCT 41.6 38.5  PLT 303 255   BMET Recent Labs    05/19/17 2234 05/21/17 0416  NA 137 144  K 3.4* 3.8  CL 104 113*  CO2 24 27  GLUCOSE 113* 151*  BUN 16 16  CREATININE 0.92 0.77  CALCIUM 9.8 8.6*   PT/INR No results for input(s): LABPROT, INR in the last 72 hours. CMP     Component Value Date/Time   NA 144 05/21/2017 0416   K 3.8 05/21/2017 0416   CL 113 (H) 05/21/2017 0416   CO2 27 05/21/2017 0416   GLUCOSE 151 (H) 05/21/2017 0416   BUN 16 05/21/2017 0416   CREATININE 0.77 05/21/2017 0416   CREATININE 0.85 03/24/2015 1547   CALCIUM 8.6 (L) 05/21/2017 0416   PROT 7.2 05/19/2017 2234   ALBUMIN 4.0 05/19/2017 2234   AST 16  05/19/2017 2234   ALT 10 (L) 05/19/2017 2234   ALKPHOS 62 05/19/2017 2234   BILITOT 0.8 05/19/2017 2234   GFRNONAA >60 05/21/2017 0416   GFRNONAA 73 03/24/2015 1547   GFRAA >60 05/21/2017 0416   GFRAA 84 03/24/2015 1547   Lipase     Component Value Date/Time   LIPASE 37 05/19/2017 2234       Studies/Results: Ct Abdomen Pelvis W Contrast  Result Date: 05/20/2017 CLINICAL DATA:  Centralized abdominal pain with nausea and vomiting 2 days. EXAM: CT ABDOMEN AND PELVIS WITH CONTRAST TECHNIQUE: Multidetector CT imaging of the abdomen and pelvis was performed using the standard protocol following bolus administration of intravenous contrast. CONTRAST:  144mL ISOVUE-300 IOPAMIDOL (ISOVUE-300) INJECTION 61% COMPARISON:  11/08/2014 FINDINGS: Lower chest: Lung bases are within normal. Hepatobiliary: There are a few subcentimeter hypodensities within the liver unchanged and too small to characterize but likely cysts. Evidence of cholelithiasis with single 8-9 mm gallstone. Biliary tree is within normal. Pancreas: Normal. Spleen: Normal. Adrenals/Urinary Tract: Adrenal glands are normal. Kidneys normal size without hydronephrosis or nephrolithiasis. Subcentimeter hypodensity over the lower pole cortex of the left kidney too small to characterize but likely a cyst. Ureters and bladder are normal. Stomach/Bowel: Stomach is normal. There are several prominent small bowel loops within the mid to lower abdomen with fecalization of  some of the small bowel loops some which are mildly dilated over the lower abdomen measuring up to 3.7 cm in diameter. There are a few thick-walled adjacent small bowel loops in the left mid to lower abdomen. There is mild stranding of the adjacent fat with patchy free fluid adjacent the small bowel loops in the mid to lower abdomen. No definite transition point is identified. Air is present throughout the colon. Previous appendectomy. No free peritoneal air. Vascular/Lymphatic: Mild  calcified plaque over the abdominal aorta and iliac arteries. No adenopathy. Reproductive: Previous hysterectomy. Other: None. Musculoskeletal: Degenerate change of the spine with disc space narrowing at the L4-5 level. IMPRESSION: Dilated small bowel loops over the central mid to lower abdomen some of which have thickened wall. No definite transition point visualized. Mild adjacent inflammatory change and free fluid. Findings likely due to early/partial small bowel obstruction and likely secondary to adhesions. Few small hypodensities within the liver too small to characterize but likely cysts and unchanged. Cholelithiasis. Aortic Atherosclerosis (ICD10-I70.0). Electronically Signed   By: Marin Olp M.D.   On: 05/20/2017 11:21   Dg Abd Portable 1v-small Bowel Obstruction Protocol-initial, 8 Hr Delay  Result Date: 05/21/2017 CLINICAL DATA:  Small bowel obstruction protocol.  8 hour delay. EXAM: PORTABLE ABDOMEN - 1 VIEW COMPARISON:  05/20/2017 FINDINGS: Enteric tube tip in the left upper quadrant consistent with location in the upper stomach. Thick wall gas distended mid abdominal small bowel and colon. Changes could indicate obstruction and/or bowel edema. Similar appearance to previous study. No oral contrast material is visualized. Residual IV contrast material demonstrated in the bladder. Calcified gallstone in the right upper quadrant. IMPRESSION: Gas distended and thick walled mid abdominal small and large bowel suggesting obstruction and/or edema. No significant change since previous study. Electronically Signed   By: Lucienne Capers M.D.   On: 05/21/2017 05:54   Dg Abd Portable 1v-small Bowel Protocol-position Verification  Result Date: 05/20/2017 CLINICAL DATA:  Nasogastric tube placement. EXAM: PORTABLE ABDOMEN - 1 VIEW COMPARISON:  CT, 05/20/2017. FINDINGS: Nasogastric tube passes below the diaphragm into the mid stomach. Normal bowel gas pattern. Residual contrast noted in the bladder.  Gallstone noted in the right upper quadrant. IMPRESSION: Well-positioned nasogastric tube. Electronically Signed   By: Lajean Manes M.D.   On: 05/20/2017 17:45    Anti-infectives: Anti-infectives (From admission, onward)   None       Assessment/Plan Aortic mural thrombus - patient stopped plavix 1 year ago Depression/Anxiety  Tobacco Use  SBO - hx of multiple abdominal surgeries - KUB this AM with no significant change in small bowel and colon, no contrast visualized on film - repeat KUB in AM  - 600 cc recorded out from NGT overnight - not bilious - had a small BM this AM - clamp NGT and allow clears - MOBILIZE!!!! - colace, miralax and dulcolax suppository prn  FEN: NGT clamp, IVF, CLD VTE: SCDs, SQ heparin ID: none   LOS: 1 day    Brigid Re , [facility] 05/21/2017, 10:13 AM Pager: [phone] Consults: [phone] Mon-Fri 7:00 am-4:30 pm Sat-Sun 7:00 am-11:30 am

## 2017-05-21 NOTE — Progress Notes (Signed)
Pt output 1,100 mLs through their NG tube after hooking NG tube to intermittent suctioning. Pt requests something to eat or drink. Explained the reason why she can not have anything to drink or eat. Pt verbalized understanding. Will continue to monitor pt.

## 2017-05-21 NOTE — Progress Notes (Signed)
Pt did not tolerate having NG tube clamped. Complained of nausea and abdominal pain, restarted intermittent suction and administered Fentanyl 24mcg IV. Upon restarting suction, NG tube output 400 cc of brown/green fluid. Notified Winfrey, MD. Pt placed back on NPO. Will continue to monitor pts NG tube output and abdominal pain.

## 2017-05-21 NOTE — Progress Notes (Signed)
Pt complaining of abdominal pain and nausea, MD ordered to turn the NG tube back to intermediate suction. Notified Winfrey, MD. Awaiting orders. Will continue to monitor pt.

## 2017-05-21 NOTE — Discharge Summary (Addendum)
[facility] Discharge Summary  Patient name: Rachel Bryan Medical record number: [id number] Date of birth: [birth year] Age: 67 y.o. Gender: female Date of Admission: 05/19/2017  Date of Discharge: 05/25/17  Admitting Physician: Kathrene Alu, MD  Primary Care Provider: Marjie Skiff, MD Consultants: General surgery  Indication for Hospitalization: small bowel obstruction  Discharge Diagnoses/Problem List:  Small bowel obstruction, resolved Conjunctival injection of R eye, improved Aortic mural thrombus, chronic Depression/anxiety Tobacco use Hematuria  Disposition: home  Discharge Condition: stable, improved  Discharge Exam:  General: NAD, pleasant Cardiovascular: RRR, no m/r/g, no LE edema Respiratory: CTA BL, normal work of breathing Gastrointestinal: soft, nontender, nondistended, normoactive BS MSK: moves 4 extremities equally Derm: no rashes appreciated Neuro: CN II-XII grossly intact Psych: AO, appropriate affect  Brief Hospital Course:  [name] was admitted on 05/20/17 for an early vs partial small bowel obstruction, causing abdominal pain, nausea, and vomiting.  Surgery was consulted in the ED and recommended placement of NG tube, NPO status, and following SBO protocol.  She received IV zofran for nausea and dilaudid for pain.  An NG tube was placed on 4/28.  On 4/29, patient reported that dilaudid was making her nauseated but that her pain was improved.  She also had a bowel movement on 4/29.  Surgery assessed her and recommended clamping the NG tube and allowing her to have clear liquids.  Later that day, she developed worsening abdominal pain, nausea, and vomiting, so the NG tube was put back on suction, and she was switched back to NPO.  Fentanyl was given rather than dilaudid for pain control.  NG was kept on suction throughout the day on 4/30 since her KUB showed absence of contrast in her intestines.  She was ambulated Q4H and  received Reglan and a dulcolax suppository PRN as well.  Patient began having bowel movement and improved on 05/02. She was then transitioned to a clear liquid diet and tolerated a soft diet prior to discharge. Patent given return precautions and instructed to continue soft diet after discharge. Follow up appointment scheduled.   Issues for Follow Up:  1. Will need follow up with urology outpatient for workup of hematuria. 2. Ensure patient is having normal bm and no abdominal pain.   Significant Procedures: none  Significant Labs and Imaging:  Recent Labs  Lab 05/21/17 0416 05/23/17 0604 05/24/17 0958  WBC 8.5 8.5 7.1  HGB 12.2 11.6* 12.5  HCT 38.5 37.7 39.4  PLT 255 232 271   Recent Labs  Lab 05/19/17 2234 05/21/17 0416 05/22/17 0430 05/23/17 0604 05/24/17 0958 05/25/17 0829  NA 137 144 146* 143 139 141  K 3.4* 3.8 3.4* 4.1 3.6 4.1  CL 104 113* 103 105 105 105  CO2 24 27 33* 29 26 27   GLUCOSE 113* 151* 109* 110* 103* 85  BUN 16 16 16 15 10  5*  CREATININE 0.92 0.77 0.74 0.70 0.74 0.71  CALCIUM 9.8 8.6* 9.3 9.1 9.2 9.5  ALKPHOS 62  --   --   --   --   --   AST 16  --   --   --   --   --   ALT 10*  --   --   --   --   --   ALBUMIN 4.0  --   --   --   --   --     Results/Tests Pending at Time of Discharge: none  Discharge Medications:  Allergies as  of 05/25/2017      Reactions   Levaquin [levofloxacin] Other (See Comments)   Per pt report.  Burning veins   Penicillins Hives, Palpitations   Has patient had a PCN reaction causing immediate rash, facial/tongue/throat swelling, SOB or lightheadedness with hypotension: yes Has patient had a PCN reaction causing severe rash involving mucus membranes or skin necrosis: yes Has patient had a PCN reaction that required hospitalization: no Has patient had a PCN reaction occurring within the last 10 years: no If all of the above answers are "NO", then may proceed with Cephalosporin use.   Codeine Hives, Other (See Comments)    sweating   Demerol [meperidine] Hives   Morphine And Related Other (See Comments)   Hallucinations   Sulfa Antibiotics Hives      Medication List    STOP taking these medications   baclofen 10 MG tablet Commonly known as:  LIORESAL   predniSONE 50 MG tablet Commonly known as:  DELTASONE   promethazine 25 MG tablet Commonly known as:  PHENERGAN     TAKE these medications   acetaminophen 325 MG tablet Commonly known as:  TYLENOL Take 650 mg by mouth every 6 (six) hours as needed for mild pain.   albuterol 108 (90 Base) MCG/ACT inhaler Commonly known as:  PROVENTIL HFA;VENTOLIN HFA Inhale 2 puffs into the lungs every 6 (six) hours as needed for wheezing or shortness of breath.   ATTENDS BREATHABLE BRIEFS MED Misc 1 application by Does not apply route daily as needed.   citalopram 20 MG tablet Commonly known as:  CELEXA take 1 tablet by mouth once daily   clopidogrel 75 MG tablet Commonly known as:  PLAVIX Take 75 mg by mouth daily.   docusate sodium 100 MG capsule Commonly known as:  COLACE Take 1 capsule (100 mg total) by mouth 2 (two) times daily.   ibuprofen 600 MG tablet Commonly known as:  ADVIL,MOTRIN Take 1 tablet (600 mg total) by mouth every 8 (eight) hours as needed. What changed:  reasons to take this   minoxidil 2 % external solution Commonly known as:  MINOXIDIL FOR WOMEN Apply topically 2 (two) times daily.   polyethylene glycol packet Commonly known as:  MIRALAX / GLYCOLAX Take 17 g by mouth daily.   topiramate 25 MG tablet Commonly known as:  TOPAMAX Take 4 tablets (100 mg total) by mouth daily.   traZODone 50 MG tablet Commonly known as:  DESYREL take 1/2 to 1 tablets by mouth at bedtime if needed for sleep What changed:    how much to take  how to take this  when to take this  reasons to take this  additional instructions   vitamin B-12 1000 MCG tablet Commonly known as:  CYANOCOBALAMIN Take 1,000 mcg by mouth daily.        Discharge Instructions: Please refer to Patient Instructions section of EMR for full details.  Patient was counseled important signs and symptoms that should prompt return to medical care, changes in medications, dietary instructions, activity restrictions, and follow up appointments.   Follow-Up Appointments:   Perrin Eddleman, [name], DO 05/25/2017, 12:14 PM PGY-1, [place]

## 2017-05-21 NOTE — Progress Notes (Signed)
Clamped pt's NG tube per MD order. Pt tolerating well. Requested cup of black coffee and ice water. MD was in the room and agreed to having everything in moderation. Pt complaining of no pain. Will continue to monitor pt.

## 2017-05-21 NOTE — Progress Notes (Addendum)
Family Medicine Teaching Service Daily Progress Note Intern Pager: [phone]  Patient name: Rachel Bryan Medical record number: [id number] Date of birth: [birth year] Age: 67 y.o. Gender: female  Primary Care Provider: Marjie Skiff, MD Consultants: surgery Code Status: full  Pt Overview and Major Events to Date:  Admitted on 4/28, NG tube placed  Assessment and Plan:  Rachel Bryan is a 67 y.o. female presenting with nausea, vomiting, and abdominal pain consistent with small bowel obstruction. PMH is significant for previous small bowel obstruction, aortic mural thrombus, depression/anxiety, tobacco use.   Small Bowel Obstruction:  Managed with NG tube and is NPO.  Required Dilaudid 0.5 mg twice overnight, last dose at 2200.  Last dose of Zofran was also at 2200.  Output from NG was 600 ml overnight.  Abdominal exam appears improved on 4/29.  Patient's abdominal pain is also improved. - appreciate surgery recommendations - hopes to avoid surgery, has clamped NG tube and may d/c tube later on 4/29 - D5NS+20 mEq KCl at 100 ml/hr; will transition to D5NS w/o KCl after 24 hours - NPO tube clamped, clear liquid diet started - serial abdominal exams - zofran 4 mg IV Q6H PRN for nausea - dilaudid 0.5 mg IV Q4H PRN for pain in 4/29 am->can change to tramadol Q6H PRN  Conjunctival Injection in R eye, improved: This is a new symptom that was noticed by a family member today.  Patient denies grittiness or pain and declines eye drops at this time.  Injection appears improved on 4/29. 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    Depression/Anxiety: Takes Celexa 20 mg daily and trazodone 50-100 mg QHS. - continue home medications  Tobacco use: Smokes 1 ppd, but declines nicotine patch because her doctor told her it was bad for her heart. - nicotine patch on patient request  Hematuria: Patient found to have Hgb in urine with  RBCs found in the past.  With her history of smoking, she is at higher risk for bladder or renal malignancy.   - follow up with urology outpatient  FEN/GI: clear liquid diet Prophylaxis: heparin  Disposition: home after resolution of SBO  Subjective:  Patient says she vomited once this morning and that the Dilaudid is making her nauseated.  She says her abdominal pain and distention have improved.  She had a normal bowel movement this morning.  Objective: Temp:  [98.3 F (36.8 C)-99 F (37.2 C)] 99 F (37.2 C) (04/29 0519) Pulse Rate:  [68-89] 81 (04/29 0519) Resp:  [16-20] 16 (04/29 0519) BP: (89-148)/(54-94) 132/71 (04/29 0519) SpO2:  [92 %-100 %] 95 % (04/28 2250) Physical Exam: General: lying in bed, NG in place, slightly anxious  Cardiovascular: RRR, no MRG Respiratory: CTAB Abdomen: normal bowel sounds, nontender to palpation, distention improved from admission Extremities: moves all spontaneously, no edema  Laboratory: Recent Labs  Lab 05/19/17 2234 05/21/17 0416  WBC 12.9* 8.5  HGB 13.7 12.2  HCT 41.6 38.5  PLT 303 255   Recent Labs  Lab 05/19/17 2234 05/21/17 0416  NA 137 144  K 3.4* 3.8  CL 104 113*  CO2 24 27  BUN 16 16  CREATININE 0.92 0.77  CALCIUM 9.8 8.6*  PROT 7.2  --   BILITOT 0.8  --   ALKPHOS 62  --   ALT 10*  --   AST 16  --   GLUCOSE 113* 151*    Imaging/Diagnostic  Tests: Ct Abdomen Pelvis W Contrast  Result Date: 05/20/2017 CLINICAL DATA:  Centralized abdominal pain with nausea and vomiting 2 days. EXAM: CT ABDOMEN AND PELVIS WITH CONTRAST TECHNIQUE: Multidetector CT imaging of the abdomen and pelvis was performed using the standard protocol following bolus administration of intravenous contrast. CONTRAST:  142mL ISOVUE-300 IOPAMIDOL (ISOVUE-300) INJECTION 61% COMPARISON:  11/08/2014 FINDINGS: Lower chest: Lung bases are within normal. Hepatobiliary: There are a few subcentimeter hypodensities within the liver unchanged and too small to  characterize but likely cysts. Evidence of cholelithiasis with single 8-9 mm gallstone. Biliary tree is within normal. Pancreas: Normal. Spleen: Normal. Adrenals/Urinary Tract: Adrenal glands are normal. Kidneys normal size without hydronephrosis or nephrolithiasis. Subcentimeter hypodensity over the lower pole cortex of the left kidney too small to characterize but likely a cyst. Ureters and bladder are normal. Stomach/Bowel: Stomach is normal. There are several prominent small bowel loops within the mid to lower abdomen with fecalization of some of the small bowel loops some which are mildly dilated over the lower abdomen measuring up to 3.7 cm in diameter. There are a few thick-walled adjacent small bowel loops in the left mid to lower abdomen. There is mild stranding of the adjacent fat with patchy free fluid adjacent the small bowel loops in the mid to lower abdomen. No definite transition point is identified. Air is present throughout the colon. Previous appendectomy. No free peritoneal air. Vascular/Lymphatic: Mild calcified plaque over the abdominal aorta and iliac arteries. No adenopathy. Reproductive: Previous hysterectomy. Other: None. Musculoskeletal: Degenerate change of the spine with disc space narrowing at the L4-5 level. IMPRESSION: Dilated small bowel loops over the central mid to lower abdomen some of which have thickened wall. No definite transition point visualized. Mild adjacent inflammatory change and free fluid. Findings likely due to early/partial small bowel obstruction and likely secondary to adhesions. Few small hypodensities within the liver too small to characterize but likely cysts and unchanged. Cholelithiasis. Aortic Atherosclerosis (ICD10-I70.0). Electronically Signed   By: Marin Olp M.D.   On: 05/20/2017 11:21   Dg Abd Portable 1v-small Bowel Obstruction Protocol-initial, 8 Hr Delay  Result Date: 05/21/2017 CLINICAL DATA:  Small bowel obstruction protocol.  8 hour delay.  EXAM: PORTABLE ABDOMEN - 1 VIEW COMPARISON:  05/20/2017 FINDINGS: Enteric tube tip in the left upper quadrant consistent with location in the upper stomach. Thick wall gas distended mid abdominal small bowel and colon. Changes could indicate obstruction and/or bowel edema. Similar appearance to previous study. No oral contrast material is visualized. Residual IV contrast material demonstrated in the bladder. Calcified gallstone in the right upper quadrant. IMPRESSION: Gas distended and thick walled mid abdominal small and large bowel suggesting obstruction and/or edema. No significant change since previous study. Electronically Signed   By: Lucienne Capers M.D.   On: 05/21/2017 05:54   Dg Abd Portable 1v-small Bowel Protocol-position Verification  Result Date: 05/20/2017 CLINICAL DATA:  Nasogastric tube placement. EXAM: PORTABLE ABDOMEN - 1 VIEW COMPARISON:  CT, 05/20/2017. FINDINGS: Nasogastric tube passes below the diaphragm into the mid stomach. Normal bowel gas pattern. Residual contrast noted in the bladder. Gallstone noted in the right upper quadrant. IMPRESSION: Well-positioned nasogastric tube. Electronically Signed   By: Lajean Manes M.D.   On: 05/20/2017 17:45     Kathrene Alu, MD 05/21/2017, 7:14 AM PGY-1, [place] Intern pager: [phone], text pages welcome

## 2017-05-22 ENCOUNTER — Inpatient Hospital Stay (HOSPITAL_COMMUNITY): Payer: Medicare HMO

## 2017-05-22 ENCOUNTER — Other Ambulatory Visit: Payer: Self-pay

## 2017-05-22 LAB — BASIC METABOLIC PANEL
Anion gap: 10 (ref 5–15)
BUN: 16 mg/dL (ref 6–20)
CALCIUM: 9.3 mg/dL (ref 8.9–10.3)
CHLORIDE: 103 mmol/L (ref 101–111)
CO2: 33 mmol/L — AB (ref 22–32)
CREATININE: 0.74 mg/dL (ref 0.44–1.00)
GFR calc non Af Amer: >60 mL/min (ref >60)
Glucose, Bld: 109 mg/dL — ABNORMAL HIGH (ref 65–99)
Potassium: 3.4 mmol/L — ABNORMAL LOW (ref 3.5–5.1)
SODIUM: 146 mmol/L — AB (ref 135–145)

## 2017-05-22 LAB — TROPONIN I

## 2017-05-22 MED ORDER — KCL IN DEXTROSE-NACL 20-5-0.45 MEQ/L-%-% IV SOLN
INTRAVENOUS | Status: DC
  Administered 2017-05-22 (×2): 1000 mL via INTRAVENOUS
  Administered 2017-05-23: via INTRAVENOUS
  Administered 2017-05-23: 1000 mL via INTRAVENOUS
  Administered 2017-05-23 – 2017-05-25 (×2): via INTRAVENOUS
  Filled 2017-05-22 (×9): qty 1000

## 2017-05-22 MED ORDER — MENTHOL 3 MG MT LOZG
1.0000 | LOZENGE | OROMUCOSAL | Status: DC | PRN
  Administered 2017-05-22 (×2): 3 mg via ORAL
  Filled 2017-05-22 (×2): qty 9

## 2017-05-22 MED ORDER — POTASSIUM CHLORIDE 10 MEQ/100ML IV SOLN
10.0000 meq | INTRAVENOUS | Status: AC
  Administered 2017-05-22 (×3): 10 meq via INTRAVENOUS
  Filled 2017-05-22 (×3): qty 100

## 2017-05-22 MED ORDER — METOCLOPRAMIDE HCL 5 MG/ML IJ SOLN
10.0000 mg | Freq: Four times a day (QID) | INTRAMUSCULAR | Status: DC
  Administered 2017-05-22 – 2017-05-25 (×12): 10 mg via INTRAVENOUS
  Filled 2017-05-22 (×12): qty 2

## 2017-05-22 NOTE — Progress Notes (Signed)
[facility] Progress Note     Subjective: CC: sbo Patient without flatus or BM. No nausea or vomiting with NGT back to suction. Denies abdominal pain currently. Wants to go home. Has not been out of bed.  VSS.   Objective: Vital signs in last 24 hours: Temp:  [98.6 F (37 C)-99 F (37.2 C)] 98.6 F (37 C) (04/30 0553) Pulse Rate:  [77-96] 77 (04/30 0553) Resp:  [16-19] 19 (04/30 0553) BP: (123-142)/(64-82) 123/67 (04/30 0553) SpO2:  [93 %-95 %] 94 % (04/30 0553) Last BM Date: 05/21/17  Intake/Output from previous day: 04/29 0701 - 04/30 0700 In: 800 [I.V.:500; NG/GT:300] Out: 1700 [Emesis/NG output:1700] Intake/Output this shift: No intake/output data recorded.  PE: Gen:  Alert, NAD, pleasant Card:  Regular rate and rhythm, pedal pulses 2+ BL Pulm:  Normal effort, clear to auscultation bilaterally Abd: Soft, non-tender, non-distended, bowel sounds tinkling, no HSM Skin: warm and dry, no rashes  Psych: A&Ox3   Lab Results:  Recent Labs    05/19/17 2234 05/21/17 0416  WBC 12.9* 8.5  HGB 13.7 12.2  HCT 41.6 38.5  PLT 303 255   BMET Recent Labs    05/21/17 0416 05/22/17 0430  NA 144 146*  K 3.8 3.4*  CL 113* 103  CO2 27 33*  GLUCOSE 151* 109*  BUN 16 16  CREATININE 0.77 0.74  CALCIUM 8.6* 9.3   PT/INR No results for input(s): LABPROT, INR in the last 72 hours. CMP     Component Value Date/Time   NA 146 (H) 05/22/2017 0430   K 3.4 (L) 05/22/2017 0430   CL 103 05/22/2017 0430   CO2 33 (H) 05/22/2017 0430   GLUCOSE 109 (H) 05/22/2017 0430   BUN 16 05/22/2017 0430   CREATININE 0.74 05/22/2017 0430   CREATININE 0.85 03/24/2015 1547   CALCIUM 9.3 05/22/2017 0430   PROT 7.2 05/19/2017 2234   ALBUMIN 4.0 05/19/2017 2234   AST 16 05/19/2017 2234   ALT 10 (L) 05/19/2017 2234   ALKPHOS 62 05/19/2017 2234   BILITOT 0.8 05/19/2017 2234   GFRNONAA >60 05/22/2017 0430   GFRNONAA 73 03/24/2015 1547   GFRAA >60 05/22/2017 0430   GFRAA 84  03/24/2015 1547   Lipase     Component Value Date/Time   LIPASE 37 05/19/2017 2234       Studies/Results: Ct Abdomen Pelvis W Contrast  Result Date: 05/20/2017 CLINICAL DATA:  Centralized abdominal pain with nausea and vomiting 2 days. EXAM: CT ABDOMEN AND PELVIS WITH CONTRAST TECHNIQUE: Multidetector CT imaging of the abdomen and pelvis was performed using the standard protocol following bolus administration of intravenous contrast. CONTRAST:  183mL ISOVUE-300 IOPAMIDOL (ISOVUE-300) INJECTION 61% COMPARISON:  11/08/2014 FINDINGS: Lower chest: Lung bases are within normal. Hepatobiliary: There are a few subcentimeter hypodensities within the liver unchanged and too small to characterize but likely cysts. Evidence of cholelithiasis with single 8-9 mm gallstone. Biliary tree is within normal. Pancreas: Normal. Spleen: Normal. Adrenals/Urinary Tract: Adrenal glands are normal. Kidneys normal size without hydronephrosis or nephrolithiasis. Subcentimeter hypodensity over the lower pole cortex of the left kidney too small to characterize but likely a cyst. Ureters and bladder are normal. Stomach/Bowel: Stomach is normal. There are several prominent small bowel loops within the mid to lower abdomen with fecalization of some of the small bowel loops some which are mildly dilated over the lower abdomen measuring up to 3.7 cm in diameter. There are a few thick-walled adjacent small bowel loops in the left mid  to lower abdomen. There is mild stranding of the adjacent fat with patchy free fluid adjacent the small bowel loops in the mid to lower abdomen. No definite transition point is identified. Air is present throughout the colon. Previous appendectomy. No free peritoneal air. Vascular/Lymphatic: Mild calcified plaque over the abdominal aorta and iliac arteries. No adenopathy. Reproductive: Previous hysterectomy. Other: None. Musculoskeletal: Degenerate change of the spine with disc space narrowing at the L4-5  level. IMPRESSION: Dilated small bowel loops over the central mid to lower abdomen some of which have thickened wall. No definite transition point visualized. Mild adjacent inflammatory change and free fluid. Findings likely due to early/partial small bowel obstruction and likely secondary to adhesions. Few small hypodensities within the liver too small to characterize but likely cysts and unchanged. Cholelithiasis. Aortic Atherosclerosis (ICD10-I70.0). Electronically Signed   By: Marin Olp M.D.   On: 05/20/2017 11:21   Dg Abd Portable 1v-small Bowel Obstruction Protocol-initial, 8 Hr Delay  Result Date: 05/21/2017 CLINICAL DATA:  Small bowel obstruction protocol.  8 hour delay. EXAM: PORTABLE ABDOMEN - 1 VIEW COMPARISON:  05/20/2017 FINDINGS: Enteric tube tip in the left upper quadrant consistent with location in the upper stomach. Thick wall gas distended mid abdominal small bowel and colon. Changes could indicate obstruction and/or bowel edema. Similar appearance to previous study. No oral contrast material is visualized. Residual IV contrast material demonstrated in the bladder. Calcified gallstone in the right upper quadrant. IMPRESSION: Gas distended and thick walled mid abdominal small and large bowel suggesting obstruction and/or edema. No significant change since previous study. Electronically Signed   By: Lucienne Capers M.D.   On: 05/21/2017 05:54   Dg Abd Portable 1v-small Bowel Protocol-position Verification  Result Date: 05/20/2017 CLINICAL DATA:  Nasogastric tube placement. EXAM: PORTABLE ABDOMEN - 1 VIEW COMPARISON:  CT, 05/20/2017. FINDINGS: Nasogastric tube passes below the diaphragm into the mid stomach. Normal bowel gas pattern. Residual contrast noted in the bladder. Gallstone noted in the right upper quadrant. IMPRESSION: Well-positioned nasogastric tube. Electronically Signed   By: Lajean Manes M.D.   On: 05/20/2017 17:45    Anti-infectives: Anti-infectives (From admission,  onward)   None       Assessment/Plan Aortic mural thrombus - patient stopped plavix 1 year ago Depression/Anxiety  Tobacco Use  SBO - hx of multiple abdominal surgeries - KUB this AM with no significant change in small bowel and colon, no contrast visualized on film - repeat KUB tomottow - 1700 cc/24 h out from NGT - keep NGT to LIWS, IV reglan 10 mg q6h today - MOBILIZE!!!! - dulcolax suppository prn  FEN: NGT to LIWS, ice chips, IVF VTE: SCDs, SQ heparin ID: none  Plan: try reglan and leaving NGT to suction today. If patient fails to improve, may require operative intervention    LOS: 2 days    Brigid Re , [facility] 05/22/2017, 7:55 AM Pager: [phone] Consults: [phone] Mon-Fri 7:00 am-4:30 pm Sat-Sun 7:00 am-11:30 am

## 2017-05-22 NOTE — Progress Notes (Signed)
Patient complaining of chest pain unrelief by pain medicine. MD made aware. Will continue to monitor.

## 2017-05-22 NOTE — Progress Notes (Signed)
Patient able to ambulate in the hallway with staff, approximately 400 feet. No acute distress noted, no complaints. Will continue to monitor.

## 2017-05-22 NOTE — Progress Notes (Signed)
Family Medicine Teaching Service Daily Progress Note Intern Pager: [phone]  Patient name: Rachel Bryan Medical record number: [id number] Date of birth: December 02, 1950 Age: 67 y.o. Gender: female  Primary Care Provider: Marjie Skiff, MD Consultants: surgery Code Status: full  Pt Overview and Major Events to Date:  Admitted on 4/28, NG tube placed  Assessment and Plan:  Rachel Bryan is a 67 y.o. female presenting with nausea, vomiting, and abdominal pain consistent with small bowel obstruction. PMH is significant for previous small bowel obstruction, aortic mural thrombus, depression/anxiety, tobacco use.   Small Bowel Obstruction:  Managed with NG tube and is NPO.   Output from NG was 1700 ml overnight.  Required fentanyl 25 mcg twice overnight.  KUB shows no contrast in small bowel or colon, so will need continued NG with suction. - appreciate surgery recommendations - NG back to suction after patient with N/V/increased pain after NG was clamped on 4/29.  Patient needs to mobilize, and dulcolax suppository prn with Reglan 10 mg Q6H were added today - D51/2NS+20 mEq KCl at 100 ml/hr with KCL 10 mEq added to bag - NPO with NG at suction - serial abdominal exams - ambulate patient in the hall Q4H - zofran 4 mg IV Q6H PRN for nausea - fentanyl 25 mcg Q2H PRN for pain  Hypokalemia: Patient with potassium of 3.4 on 4/30.   - fluids with KCl - daily BMP  Depression/Anxiety: Takes Celexa 20 mg daily and trazodone 50-100 mg QHS. - continue home medications  Tobacco use: Smokes 1 ppd, but declines nicotine patch because her doctor told her it was bad for her heart. - nicotine patch on patient request  Hematuria: Patient found to have Hgb in urine with RBCs found in the past.  With her history of smoking, she is at higher risk for bladder or renal malignancy.   - follow up with urology outpatient  FEN/GI: NPO Prophylaxis: heparin  Disposition: home after resolution of  SBO  Subjective:  Patient says she is tired of the NG tube and not being able to eat and wants to go home.  She says the NG tube is hurting her throat and her stomach.  She denies nausea or abdominal pain this morning.  She passed gas yesterday evening.  Objective: Temp:  [98.6 F (37 C)-99 F (37.2 C)] 98.6 F (37 C) (04/30 0553) Pulse Rate:  [77-96] 77 (04/30 0553) Resp:  [16-19] 19 (04/30 0553) BP: (123-142)/(64-82) 123/67 (04/30 0553) SpO2:  [93 %-95 %] 94 % (04/30 0553) Physical Exam: General: lying in bed, NG in place, appears frustrated  Cardiovascular: RRR, no MRG Respiratory: CTAB Abdomen: normal bowel sounds, nontender to palpation, distention continues to be improved from admission Extremities: moves all extremities spontaneously, no edema  Laboratory: Recent Labs  Lab 05/19/17 2234 05/21/17 0416  WBC 12.9* 8.5  HGB 13.7 12.2  HCT 41.6 38.5  PLT 303 255   Recent Labs  Lab 05/19/17 2234 05/21/17 0416 05/22/17 0430  NA 137 144 146*  K 3.4* 3.8 3.4*  CL 104 113* 103  CO2 24 27 33*  BUN 16 16 16   CREATININE 0.92 0.77 0.74  CALCIUM 9.8 8.6* 9.3  PROT 7.2  --   --   BILITOT 0.8  --   --   ALKPHOS 62  --   --   ALT 10*  --   --   AST 16  --   --   GLUCOSE 113* 151* 109*    Imaging/Diagnostic  Tests: Ct Abdomen Pelvis W Contrast  Result Date: 05/20/2017 CLINICAL DATA:  Centralized abdominal pain with nausea and vomiting 2 days. EXAM: CT ABDOMEN AND PELVIS WITH CONTRAST TECHNIQUE: Multidetector CT imaging of the abdomen and pelvis was performed using the standard protocol following bolus administration of intravenous contrast. CONTRAST:  168mL ISOVUE-300 IOPAMIDOL (ISOVUE-300) INJECTION 61% COMPARISON:  11/08/2014 FINDINGS: Lower chest: Lung bases are within normal. Hepatobiliary: There are a few subcentimeter hypodensities within the liver unchanged and too small to characterize but likely cysts. Evidence of cholelithiasis with single 8-9 mm gallstone. Biliary  tree is within normal. Pancreas: Normal. Spleen: Normal. Adrenals/Urinary Tract: Adrenal glands are normal. Kidneys normal size without hydronephrosis or nephrolithiasis. Subcentimeter hypodensity over the lower pole cortex of the left kidney too small to characterize but likely a cyst. Ureters and bladder are normal. Stomach/Bowel: Stomach is normal. There are several prominent small bowel loops within the mid to lower abdomen with fecalization of some of the small bowel loops some which are mildly dilated over the lower abdomen measuring up to 3.7 cm in diameter. There are a few thick-walled adjacent small bowel loops in the left mid to lower abdomen. There is mild stranding of the adjacent fat with patchy free fluid adjacent the small bowel loops in the mid to lower abdomen. No definite transition point is identified. Air is present throughout the colon. Previous appendectomy. No free peritoneal air. Vascular/Lymphatic: Mild calcified plaque over the abdominal aorta and iliac arteries. No adenopathy. Reproductive: Previous hysterectomy. Other: None. Musculoskeletal: Degenerate change of the spine with disc space narrowing at the L4-5 level. IMPRESSION: Dilated small bowel loops over the central mid to lower abdomen some of which have thickened wall. No definite transition point visualized. Mild adjacent inflammatory change and free fluid. Findings likely due to early/partial small bowel obstruction and likely secondary to adhesions. Few small hypodensities within the liver too small to characterize but likely cysts and unchanged. Cholelithiasis. Aortic Atherosclerosis (ICD10-I70.0). Electronically Signed   By: Marin Olp M.D.   On: 05/20/2017 11:21   Dg Abd Portable 1v-small Bowel Obstruction Protocol-initial, 8 Hr Delay  Result Date: 05/21/2017 CLINICAL DATA:  Small bowel obstruction protocol.  8 hour delay. EXAM: PORTABLE ABDOMEN - 1 VIEW COMPARISON:  05/20/2017 FINDINGS: Enteric tube tip in the left  upper quadrant consistent with location in the upper stomach. Thick wall gas distended mid abdominal small bowel and colon. Changes could indicate obstruction and/or bowel edema. Similar appearance to previous study. No oral contrast material is visualized. Residual IV contrast material demonstrated in the bladder. Calcified gallstone in the right upper quadrant. IMPRESSION: Gas distended and thick walled mid abdominal small and large bowel suggesting obstruction and/or edema. No significant change since previous study. Electronically Signed   By: Lucienne Capers M.D.   On: 05/21/2017 05:54   Dg Abd Portable 1v-small Bowel Protocol-position Verification  Result Date: 05/20/2017 CLINICAL DATA:  Nasogastric tube placement. EXAM: PORTABLE ABDOMEN - 1 VIEW COMPARISON:  CT, 05/20/2017. FINDINGS: Nasogastric tube passes below the diaphragm into the mid stomach. Normal bowel gas pattern. Residual contrast noted in the bladder. Gallstone noted in the right upper quadrant. IMPRESSION: Well-positioned nasogastric tube. Electronically Signed   By: Lajean Manes M.D.   On: 05/20/2017 17:45     Kathrene Alu, MD 05/22/2017, 9:22 AM PGY-1, [place] Intern pager: [phone], text pages welcome

## 2017-05-23 ENCOUNTER — Inpatient Hospital Stay (HOSPITAL_COMMUNITY): Payer: Medicare HMO

## 2017-05-23 LAB — CBC
HEMATOCRIT: 37.7 % (ref 36.0–46.0)
Hemoglobin: 11.6 g/dL — ABNORMAL LOW (ref 12.0–15.0)
MCH: 27.9 pg (ref 26.0–34.0)
MCHC: 30.8 g/dL (ref 30.0–36.0)
MCV: 90.6 fL (ref 78.0–100.0)
Platelets: 232 10*3/uL (ref 150–400)
RBC: 4.16 MIL/uL (ref 3.87–5.11)
RDW: 13.4 % (ref 11.5–15.5)
WBC: 8.5 10*3/uL (ref 4.0–10.5)

## 2017-05-23 LAB — BASIC METABOLIC PANEL
Anion gap: 9 (ref 5–15)
BUN: 15 mg/dL (ref 6–20)
CO2: 29 mmol/L (ref 22–32)
Calcium: 9.1 mg/dL (ref 8.9–10.3)
Chloride: 105 mmol/L (ref 101–111)
Creatinine, Ser: 0.7 mg/dL (ref 0.44–1.00)
GFR calc Af Amer: >60 mL/min (ref >60)
GFR calc non Af Amer: >60 mL/min (ref >60)
GLUCOSE: 110 mg/dL — AB (ref 65–99)
POTASSIUM: 4.1 mmol/L (ref 3.5–5.1)
Sodium: 143 mmol/L (ref 135–145)

## 2017-05-23 MED ORDER — BISACODYL 10 MG RE SUPP
10.0000 mg | Freq: Once | RECTAL | Status: AC
  Administered 2017-05-23: 10 mg via RECTAL
  Filled 2017-05-23: qty 1

## 2017-05-23 NOTE — Progress Notes (Signed)
Family Medicine Teaching Service Daily Progress Note Intern Pager: [phone]  Patient name: Rachel Bryan Medical record number: [id number] Date of birth: 1950-04-21 Age: 67 y.o. Gender: female  Primary Care Provider: Marjie Skiff, MD Consultants: surgery Code Status: full  Pt Overview and Major Events to Date:  Admitted on 4/28, NG tube placed  Assessment and Plan:  Rachel Bryan is a 67 y.o. female presenting with nausea, vomiting, and abdominal pain consistent with small bowel obstruction. PMH is significant for previous small bowel obstruction, aortic mural thrombus, depression/anxiety, tobacco use.   Small Bowel Obstruction:  Managed with NG tube and is NPO.   Output from NG was 1150 ml overnight.  Required no fentanyl 25 overnight.  Continued NG with suction. Patient with flatus this am and reports mobilizing.  - appreciate surgery recommendations - NG back to suction and had repeat KUB this am. May need surgery based on KUB results.  - D51/2NS+20 mEq KCl at 100 ml/hr with KCL 10 mEq added to bag - NPO with NG at suction - serial abdominal exams- KUB read pending this am - ambulate patient in the hall Q4H - zofran 4 mg IV Q6H PRN for nausea - fentanyl 25 mcg Q2H PRN for pain  Hypokalemia, resolved: Patient with potassium of 4.1 on 5/01.   - fluids with KCl - daily BMP  Depression/Anxiety: Takes Celexa 20 mg daily and trazodone 50-100 mg QHS. - continue home medications  Tobacco use: Smokes 1 ppd, but declines nicotine patch because her doctor told her it was bad for her heart. - nicotine patch on patient request  Hematuria: Patient found to have Hgb in urine with RBCs found in the past.  With her history of smoking, she is at higher risk for bladder or renal malignancy.   - follow up with urology outpatient  FEN/GI: NPO with ice chips Prophylaxis: heparin  Disposition: home after resolution of SBO  Subjective:  Patient sitting up in chair and is anxious about  possibility of surgery. Reports her second surgery did not go well. patient would like to go home as soon as she is safe to. Feels better today.   Objective: Temp:  [98 F (36.7 C)-98.9 F (37.2 C)] 98 F (36.7 C) (05/01 0540) Pulse Rate:  [80-90] 90 (05/01 0540) Resp:  [16-20] 16 (05/01 0540) BP: (139-151)/(56-74) 139/69 (05/01 0540) SpO2:  [94 %-96 %] 96 % (05/01 0540) Weight:  [125 lb 14.1 oz (57.1 kg)] 125 lb 14.1 oz (57.1 kg) (04/30 1607) Physical Exam: General: NAD, pleasant, NG in place on suction from wall Neck: Supple, no LAD Cardiovascular: RRR, no m/r/g, no LE edema Respiratory: CTA BL, normal work of breathing Gastrointestinal: soft, nontender, nondistended, no bowel sounds appreciated MSK: moves 4 extremities equally Derm: no rashes appreciated Neuro: CN II-XII grossly intact Psych: AOx3, appropriate affect   Laboratory: Recent Labs  Lab 05/19/17 2234 05/21/17 0416 05/23/17 0604  WBC 12.9* 8.5 8.5  HGB 13.7 12.2 11.6*  HCT 41.6 38.5 37.7  PLT 303 255 232   Recent Labs  Lab 05/19/17 2234 05/21/17 0416 05/22/17 0430 05/23/17 0604  NA 137 144 146* 143  K 3.4* 3.8 3.4* 4.1  CL 104 113* 103 105  CO2 24 27 33* 29  BUN 16 16 16 15   CREATININE 0.92 0.77 0.74 0.70  CALCIUM 9.8 8.6* 9.3 9.1  PROT 7.2  --   --   --   BILITOT 0.8  --   --   --  ALKPHOS 62  --   --   --   ALT 10*  --   --   --   AST 16  --   --   --   GLUCOSE 113* 151* 109* 110*    Imaging/Diagnostic Tests: Dg Abd Portable 1v  Result Date: 05/22/2017 CLINICAL DATA:  67 year old female with abdominal pain. EXAM: PORTABLE ABDOMEN - 1 VIEW COMPARISON:  0702 hours today, and earlier. FINDINGS: Portable AP supine view at 0855 hours. Unchanged NG tube position. Unchanged bowel gas pattern. Gas-filled mid abdominal small bowel measuring about 40 millimeters diameter. Stable cholelithiasis. Stable and negative lung bases. IMPRESSION: 1. Unchanged NG tube position with side hole at the distal  thoracic esophagus. Advance 7 cm to ensure side hole placement within the stomach. 2. Stable bowel gas pattern since the CT Abdomen and Pelvis on 05/20/2017. Electronically Signed   By: Genevie Ann M.D.   On: 05/22/2017 09:52   Dg Abd Portable 1v  Result Date: 05/22/2017 CLINICAL DATA:  67 year old female with abdominal pain. EXAM: PORTABLE ABDOMEN - 1 VIEW COMPARISON:  KUB 05/21/2017. CT Abdomen and Pelvis 05/20/2017, and earlier. FINDINGS: Portable AP supine view at 0702 hours. Enteric tube is in place, and the tip projects within the stomach but the side hole is at the distal esophagus. Allowing for portable technique the lung bases appear clear. Continued stable bowel gas pattern with mildly dilated gas-filled mid abdominal small bowel loops up to 41 millimeters diameter. Stable abdominal visceral contours, cholelithiasis redemonstrated. No acute osseous abnormality identified. IMPRESSION: 1. Enteric tube in place, side hole at the distal esophagus level. Advanced 7 cm to ensure side hole placement within the stomach. 2. Stable bowel gas pattern since the CT Abdomen and Pelvis on 05/20/2017. Electronically Signed   By: Genevie Ann M.D.   On: 05/22/2017 09:48    Roan [place], [name], DO 05/23/2017, 7:28 AM PGY-1, [place] Intern pager: [phone], text pages welcome

## 2017-05-23 NOTE — Progress Notes (Signed)
Per MD recommendation, NG was advanced by 10 cm. Patient tolerated well. Will continue to monitor.

## 2017-05-23 NOTE — Progress Notes (Signed)
[facility] Progress Note     Subjective: CC: sbo Patient denies nausea or abdominal pain. Belly is not distended. Feels like she may have passed a little flatus, but not entirely sure. Had some chest pain overnight that is now improved.  UOP good. VSS.   Objective: Vital signs in last 24 hours: Temp:  [98 F (36.7 C)-98.9 F (37.2 C)] 98 F (36.7 C) (05/01 0540) Pulse Rate:  [80-90] 90 (05/01 0540) Resp:  [16-20] 16 (05/01 0540) BP: (139-151)/(56-74) 139/69 (05/01 0540) SpO2:  [94 %-96 %] 96 % (05/01 0540) Weight:  [57.1 kg (125 lb 14.1 oz)] 57.1 kg (125 lb 14.1 oz) (04/30 1607) Last BM Date: 05/21/17  Intake/Output from previous day: 04/30 0701 - 05/01 0700 In: 2166.7 [I.V.:1866.7; IV Piggyback:300] Out: 1150 [Emesis/NG output:1150] Intake/Output this shift: No intake/output data recorded.  PE: Gen:  Alert, NAD, pleasant Card:  Regular rate and rhythm, pedal pulses 2+ BL Pulm:  Normal effort, clear to auscultation bilaterally Abd: Soft, non-tender, non-distended, bowel sounds hypoactive, no HSM Skin: warm and dry, no rashes  Psych: A&Ox3   Lab Results:  Recent Labs    05/21/17 0416 05/23/17 0604  WBC 8.5 8.5  HGB 12.2 11.6*  HCT 38.5 37.7  PLT 255 232   BMET Recent Labs    05/22/17 0430 05/23/17 0604  NA 146* 143  K 3.4* 4.1  CL 103 105  CO2 33* 29  GLUCOSE 109* 110*  BUN 16 15  CREATININE 0.74 0.70  CALCIUM 9.3 9.1   PT/INR No results for input(s): LABPROT, INR in the last 72 hours. CMP     Component Value Date/Time   NA 143 05/23/2017 0604   K 4.1 05/23/2017 0604   CL 105 05/23/2017 0604   CO2 29 05/23/2017 0604   GLUCOSE 110 (H) 05/23/2017 0604   BUN 15 05/23/2017 0604   CREATININE 0.70 05/23/2017 0604   CREATININE 0.85 03/24/2015 1547   CALCIUM 9.1 05/23/2017 0604   PROT 7.2 05/19/2017 2234   ALBUMIN 4.0 05/19/2017 2234   AST 16 05/19/2017 2234   ALT 10 (L) 05/19/2017 2234   ALKPHOS 62 05/19/2017 2234   BILITOT 0.8  05/19/2017 2234   GFRNONAA >60 05/23/2017 0604   GFRNONAA 73 03/24/2015 1547   GFRAA >60 05/23/2017 0604   GFRAA 84 03/24/2015 1547   Lipase     Component Value Date/Time   LIPASE 37 05/19/2017 2234       Studies/Results: Dg Abd Portable 1v  Result Date: 05/22/2017 CLINICAL DATA:  67 year old female with abdominal pain. EXAM: PORTABLE ABDOMEN - 1 VIEW COMPARISON:  0702 hours today, and earlier. FINDINGS: Portable AP supine view at 0855 hours. Unchanged NG tube position. Unchanged bowel gas pattern. Gas-filled mid abdominal small bowel measuring about 40 millimeters diameter. Stable cholelithiasis. Stable and negative lung bases. IMPRESSION: 1. Unchanged NG tube position with side hole at the distal thoracic esophagus. Advance 7 cm to ensure side hole placement within the stomach. 2. Stable bowel gas pattern since the CT Abdomen and Pelvis on 05/20/2017. Electronically Signed   By: Genevie Ann M.D.   On: 05/22/2017 09:52   Dg Abd Portable 1v  Result Date: 05/22/2017 CLINICAL DATA:  67 year old female with abdominal pain. EXAM: PORTABLE ABDOMEN - 1 VIEW COMPARISON:  KUB 05/21/2017. CT Abdomen and Pelvis 05/20/2017, and earlier. FINDINGS: Portable AP supine view at 0702 hours. Enteric tube is in place, and the tip projects within the stomach but the side hole is at the  distal esophagus. Allowing for portable technique the lung bases appear clear. Continued stable bowel gas pattern with mildly dilated gas-filled mid abdominal small bowel loops up to 41 millimeters diameter. Stable abdominal visceral contours, cholelithiasis redemonstrated. No acute osseous abnormality identified. IMPRESSION: 1. Enteric tube in place, side hole at the distal esophagus level. Advanced 7 cm to ensure side hole placement within the stomach. 2. Stable bowel gas pattern since the CT Abdomen and Pelvis on 05/20/2017. Electronically Signed   By: Genevie Ann M.D.   On: 05/22/2017 09:48    Anti-infectives: Anti-infectives (From  admission, onward)   None       Assessment/Plan Aortic mural thrombus - patient stopped plavix 1 year ago Depression/Anxiety  Tobacco Use  SBO - hx of multiple abdominal surgeries - belly soft and not distended, patient thinks she may have passed a little flatus- repeat KUB this AM, if improved will try clamping again; if not improved will likely need surgery either late today vs tomorrow AM - 1100 cc/24 h out from NGT - keep NGT to LIWS, IV reglan 10 mg q6h  - MOBILIZE!!!! - dulcolax suppository prn  FEN: NGT to LIWS, ice chips, IVF VTE: SCDs, SQ heparin ID: none  Plan: Repeat KUB. Clamping trial if distention improving, OR if not improving    LOS: 3 days    Brigid Re , [facility] Surgery 05/23/2017, 8:04 AM Pager: [phone] Consults: [phone] Mon-Fri 7:00 am-4:30 pm Sat-Sun 7:00 am-11:30 am

## 2017-05-24 ENCOUNTER — Inpatient Hospital Stay (HOSPITAL_COMMUNITY): Payer: Medicare HMO

## 2017-05-24 LAB — BASIC METABOLIC PANEL
ANION GAP: 8 (ref 5–15)
BUN: 10 mg/dL (ref 6–20)
CHLORIDE: 105 mmol/L (ref 101–111)
CO2: 26 mmol/L (ref 22–32)
Calcium: 9.2 mg/dL (ref 8.9–10.3)
Creatinine, Ser: 0.74 mg/dL (ref 0.44–1.00)
GFR calc non Af Amer: >60 mL/min (ref >60)
GLUCOSE: 103 mg/dL — AB (ref 65–99)
POTASSIUM: 3.6 mmol/L (ref 3.5–5.1)
Sodium: 139 mmol/L (ref 135–145)

## 2017-05-24 LAB — CBC
HEMATOCRIT: 39.4 % (ref 36.0–46.0)
HEMOGLOBIN: 12.5 g/dL (ref 12.0–15.0)
MCH: 28.6 pg (ref 26.0–34.0)
MCHC: 31.7 g/dL (ref 30.0–36.0)
MCV: 90.2 fL (ref 78.0–100.0)
Platelets: 271 10*3/uL (ref 150–400)
RBC: 4.37 MIL/uL (ref 3.87–5.11)
RDW: 13.4 % (ref 11.5–15.5)
WBC: 7.1 10*3/uL (ref 4.0–10.5)

## 2017-05-24 MED ORDER — POLYETHYLENE GLYCOL 3350 17 G PO PACK
17.0000 g | PACK | Freq: Every day | ORAL | Status: DC
  Administered 2017-05-24: 17 g via ORAL
  Filled 2017-05-24 (×2): qty 1

## 2017-05-24 MED ORDER — DOCUSATE SODIUM 100 MG PO CAPS
100.0000 mg | ORAL_CAPSULE | Freq: Two times a day (BID) | ORAL | Status: DC
  Administered 2017-05-24 – 2017-05-25 (×3): 100 mg via ORAL
  Filled 2017-05-24 (×3): qty 1

## 2017-05-24 NOTE — Progress Notes (Signed)
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 Had 2 BM overnight and passing flatus. Denies nausea or abdominal pain.   Objective: Vital signs in last 24 hours: Temp:  [98 F (36.7 C)-98.9 F (37.2 C)] 98 F (36.7 C) (05/02 0446) Pulse Rate:  [72-79] 72 (05/02 0446) Resp:  [16-18] 16 (05/02 0446) BP: (128-135)/(65-66) 128/66 (05/02 0446) SpO2:  [96 %-99 %] 96 % (05/02 0446) Last BM Date: 05/23/17  Intake/Output from previous day: 05/01 0701 - 05/02 0700 In: 1758.3 [I.V.:1658.3; NG/GT:100] Out: 400 [Emesis/NG output:400] Intake/Output this shift: No intake/output data recorded.  PE: Gen:  Alert, NAD, pleasant Card:  Regular rate and rhythm, pedal pulses 2+ BL Pulm:  Normal effort, clear to auscultation bilaterally Abd: Soft, non-tender, non-distended, bowel sounds more normoactive, no HSM Skin: warm and dry, no rashes  Psych: A&Ox3     Lab Results:  Recent Labs    05/23/17 0604  WBC 8.5  HGB 11.6*  HCT 37.7  PLT 232   BMET Recent Labs    05/22/17 0430 05/23/17 0604  NA 146* 143  K 3.4* 4.1  CL 103 105  CO2 33* 29  GLUCOSE 109* 110*  BUN 16 15  CREATININE 0.74 0.70  CALCIUM 9.3 9.1   PT/INR No results for input(s): LABPROT, INR in the last 72 hours. CMP     Component Value Date/Time   NA 143 05/23/2017 0604   K 4.1 05/23/2017 0604   CL 105 05/23/2017 0604   CO2 29 05/23/2017 0604   GLUCOSE 110 (H) 05/23/2017 0604   BUN 15 05/23/2017 0604   CREATININE 0.70 05/23/2017 0604   CREATININE 0.85 03/24/2015 1547   CALCIUM 9.1 05/23/2017 0604   PROT 7.2 05/19/2017 2234   ALBUMIN 4.0 05/19/2017 2234   AST 16 05/19/2017 2234   ALT 10 (L) 05/19/2017 2234   ALKPHOS 62 05/19/2017 2234   BILITOT 0.8 05/19/2017 2234   GFRNONAA >60 05/23/2017 0604   GFRNONAA 73 03/24/2015 1547   GFRAA >60 05/23/2017 0604   GFRAA 84 03/24/2015 1547    Lipase     Component Value Date/Time   LIPASE 37 05/19/2017 2234       Studies/Results: Dg Abd Portable 1v  Result Date: 05/23/2017 CLINICAL DATA:  Abdominal pain, small-bowel obstruction. EXAM: PORTABLE ABDOMEN - 1 VIEW COMPARISON:  KUB of May 22, 2017 FINDINGS: There remains a loop of moderately distended gas-filled small bowel projecting over the third and fourth lumbar vertebra. There is a loop of what appears to be small bowel in the true pelvis that is mildly distended. There is normal calibered colon present throughout the abdomen. No free extraluminal gas collections are observed. There is a dense calcification in the right mid abdomen consistent with a known gallstone. The esophagogastric tubes proximal port lies above the GE junction with the tip of the tube in the gastric cardia. IMPRESSION: Advancement of the nasogastric tube by 10 cm is needed to assure that the proximal port is positioned below the GE junction. Persistent mildly distended gas-filled small bowel loops in the mid and lower abdomen. No free extraluminal gas. Calcified gallstone. Electronically Signed   By: David  [name] M.D.   On: 05/23/2017 10:37    Anti-infectives: Anti-infectives (From admission, onward)   None       Assessment/Plan Aortic mural thrombus - patient stopped plavix  1 year ago Depression/Anxiety  Tobacco Use  SBO - hx of multiple abdominal surgeries - 2BM overnight, try CLD -400 cc/24 hout from NGT - NGT has been clamped this AM - MOBILIZE!!!! - dulcolax suppository prn, colace BID, miralax prn  FEN: NGTclamp; CLD VTE: SCDs, SQ heparin ID: none  Plan: Clamp NGT. CLD. Mobilize. If improving will ADAT and d/c NGT later today. If not improving OR tomorrow    LOS: 4 days    Brigid Re , [facility] Surgery 05/24/2017, 9:17 AM Pager: [phone] Consults: [phone] Mon-Fri 7:00 am-4:30 pm Sat-Sun 7:00 am-11:30 am

## 2017-05-24 NOTE — Progress Notes (Signed)
Family Medicine Teaching Service Daily Progress Note Intern Pager: [phone]  Patient name: Rachel Bryan Medical record number: [id number] Date of birth: December 13, 1950 Age: 67 y.o. Gender: female  Primary Care Provider: Marjie Skiff, MD Consultants: surgery Code Status: full  Pt Overview and Major Events to Date:  Admitted on 4/28, NG tube placed  Assessment and Plan:  Rachel Bryan is a 67 y.o. female presenting with nausea, vomiting, and abdominal pain consistent with small bowel obstruction. PMH is significant for previous small bowel obstruction, aortic mural thrombus, depression/anxiety, tobacco use.   Small Bowel Obstruction:  Managed with NG tube and clamped this am. Output from NG. Requiring no fentanyl for 48 hours now. Patient with bm x2 yesterday and reports mobilizing.  - appreciate surgery recommendations - NG back to suction and had repeat KUB this am. May need surgery based on KUB results.  - D51/2NS+20 mEq KCl at 100 ml/hr with KCL 10 mEq added to bag - NG clamped - ambulate patient in the hall Q4H - zofran 4 mg IV Q6H PRN for nausea - fentanyl 25 mcg Q2H PRN for pain  Hypokalemia, resolved: Patient with potassium of 4.1 on 5/01. Pending.  - fluids with KCl - daily BMP  Depression/Anxiety: Takes Celexa 20 mg daily and trazodone 50-100 mg QHS. - continue home medications  Tobacco use: Smokes 1 ppd, but declines nicotine patch because her doctor told her it was bad for her heart. - nicotine patch on patient request  Hematuria: Patient found to have Hgb in urine with RBCs found in the past.  With her history of smoking, she is at higher risk for bladder or renal malignancy.   - follow up with urology outpatient  FEN/GI: NPO with ice chips Prophylaxis: heparin  Disposition: home after resolution of SBO  Subjective:  Patient sitting up in chair and is ready to go home to take care of sister and granddaughter.   Objective: Temp:  [98 F (36.7 C)-98.9 F  (37.2 C)] 98 F (36.7 C) (05/02 0446) Pulse Rate:  [72-79] 72 (05/02 0446) Resp:  [16-18] 16 (05/02 0446) BP: (128-135)/(65-66) 128/66 (05/02 0446) SpO2:  [96 %-99 %] 96 % (05/02 0446) Physical Exam: General: NAD, pleasant, NG in place Cardiovascular: RRR, no m/r/g, no LE edema Respiratory: CTA BL, normal work of breathing Gastrointestinal: soft, nontender, nondistended, normoactive BS MSK: moves 4 extremities equally Derm: no rashes appreciated Neuro: CN II-XII grossly intact Psych: AOx3, appropriate affect  Laboratory: Recent Labs  Lab 05/19/17 2234 05/21/17 0416 05/23/17 0604  WBC 12.9* 8.5 8.5  HGB 13.7 12.2 11.6*  HCT 41.6 38.5 37.7  PLT 303 255 232   Recent Labs  Lab 05/19/17 2234 05/21/17 0416 05/22/17 0430 05/23/17 0604  NA 137 144 146* 143  K 3.4* 3.8 3.4* 4.1  CL 104 113* 103 105  CO2 24 27 33* 29  BUN 16 16 16 15   CREATININE 0.92 0.77 0.74 0.70  CALCIUM 9.8 8.6* 9.3 9.1  PROT 7.2  --   --   --   BILITOT 0.8  --   --   --   ALKPHOS 62  --   --   --   ALT 10*  --   --   --   AST 16  --   --   --   GLUCOSE 113* 151* 109* 110*    Imaging/Diagnostic Tests: Dg Abd Portable 1v  Result Date: 05/23/2017 CLINICAL DATA:  Abdominal pain, small-bowel obstruction. EXAM: PORTABLE ABDOMEN - 1  VIEW COMPARISON:  KUB of May 22, 2017 FINDINGS: There remains a loop of moderately distended gas-filled small bowel projecting over the third and fourth lumbar vertebra. There is a loop of what appears to be small bowel in the true pelvis that is mildly distended. There is normal calibered colon present throughout the abdomen. No free extraluminal gas collections are observed. There is a dense calcification in the right mid abdomen consistent with a known gallstone. The esophagogastric tubes proximal port lies above the GE junction with the tip of the tube in the gastric cardia. IMPRESSION: Advancement of the nasogastric tube by 10 cm is needed to assure that the proximal port is  positioned below the GE junction. Persistent mildly distended gas-filled small bowel loops in the mid and lower abdomen. No free extraluminal gas. Calcified gallstone. Electronically Signed   By: David  [name] M.D.   On: 05/23/2017 10:37   Dg Abd Portable 1v  Result Date: 05/22/2017 CLINICAL DATA:  67 year old female with abdominal pain. EXAM: PORTABLE ABDOMEN - 1 VIEW COMPARISON:  0702 hours today, and earlier. FINDINGS: Portable AP supine view at 0855 hours. Unchanged NG tube position. Unchanged bowel gas pattern. Gas-filled mid abdominal small bowel measuring about 40 millimeters diameter. Stable cholelithiasis. Stable and negative lung bases. IMPRESSION: 1. Unchanged NG tube position with side hole at the distal thoracic esophagus. Advance 7 cm to ensure side hole placement within the stomach. 2. Stable bowel gas pattern since the CT Abdomen and Pelvis on 05/20/2017. Electronically Signed   By: Genevie Ann M.D.   On: 05/22/2017 09:52   Dg Abd Portable 1v  Result Date: 05/22/2017 CLINICAL DATA:  67 year old female with abdominal pain. EXAM: PORTABLE ABDOMEN - 1 VIEW COMPARISON:  KUB 05/21/2017. CT Abdomen and Pelvis 05/20/2017, and earlier. FINDINGS: Portable AP supine view at 0702 hours. Enteric tube is in place, and the tip projects within the stomach but the side hole is at the distal esophagus. Allowing for portable technique the lung bases appear clear. Continued stable bowel gas pattern with mildly dilated gas-filled mid abdominal small bowel loops up to 41 millimeters diameter. Stable abdominal visceral contours, cholelithiasis redemonstrated. No acute osseous abnormality identified. IMPRESSION: 1. Enteric tube in place, side hole at the distal esophagus level. Advanced 7 cm to ensure side hole placement within the stomach. 2. Stable bowel gas pattern since the CT Abdomen and Pelvis on 05/20/2017. Electronically Signed   By: Genevie Ann M.D.   On: 05/22/2017 09:48    Woodfin Kiss, [name], DO 05/24/2017, 6:53  AM PGY-1, [place] Intern pager: [phone], text pages welcome

## 2017-05-24 NOTE — Care Management Important Message (Signed)
Important Message  Patient Details  Name: Rachel Bryan MRN: [id number] Date of Birth: [birth year]   Medicare Important Message Given:  Yes    Orbie Pyo 05/24/2017, 2:53 PM

## 2017-05-25 ENCOUNTER — Inpatient Hospital Stay (HOSPITAL_COMMUNITY): Payer: Medicare HMO

## 2017-05-25 LAB — BASIC METABOLIC PANEL
ANION GAP: 9 (ref 5–15)
BUN: 5 mg/dL — AB (ref 6–20)
CO2: 27 mmol/L (ref 22–32)
Calcium: 9.5 mg/dL (ref 8.9–10.3)
Chloride: 105 mmol/L (ref 101–111)
Creatinine, Ser: 0.71 mg/dL (ref 0.44–1.00)
Glucose, Bld: 85 mg/dL (ref 65–99)
POTASSIUM: 4.1 mmol/L (ref 3.5–5.1)
SODIUM: 141 mmol/L (ref 135–145)

## 2017-05-25 MED ORDER — DOCUSATE SODIUM 100 MG PO CAPS: 100.0000 mg | ORAL_CAPSULE | Freq: Two times a day (BID) | ORAL | 0 refills | Status: DC

## 2017-05-25 MED ORDER — POLYETHYLENE GLYCOL 3350 17 G PO PACK: 17.0000 g | PACK | Freq: Every day | ORAL | 0 refills | Status: DC

## 2017-05-25 NOTE — Progress Notes (Signed)
[facility] Progress Note     Subjective: CC: sbo Patient tolerating diet advancement and having bowel function. Denied abdominal pain, n/v.  UOP good. VSS.   Objective: Vital signs in last 24 hours: Temp:  [98.1 F (36.7 C)-98.7 F (37.1 C)] 98.7 F (37.1 C) (05/03 0541) Pulse Rate:  [70-79] 70 (05/03 0541) Resp:  [14-20] 18 (05/03 0541) BP: (104-119)/(65-100) 104/65 (05/03 0541) SpO2:  [96 %-100 %] 99 % (05/03 0541) Last BM Date: 05/24/17  Intake/Output from previous day: 05/02 0701 - 05/03 0700 In: 120 [P.O.:120] Out: -  Intake/Output this shift: No intake/output data recorded.  PE: Gen:  Alert, NAD, pleasant Card:  Regular rate and rhythm, pedal pulses 2+ BL Pulm:  Normal effort, clear to auscultation bilaterally Abd: Soft, non-tender, non-distended, bowel sounds present Skin: warm and dry, no rashes  Psych: A&Ox3   Lab Results:  Recent Labs    05/23/17 0604 05/24/17 0958  WBC 8.5 7.1  HGB 11.6* 12.5  HCT 37.7 39.4  PLT 232 271   BMET Recent Labs    05/23/17 0604 05/24/17 0958  NA 143 139  K 4.1 3.6  CL 105 105  CO2 29 26  GLUCOSE 110* 103*  BUN 15 10  CREATININE 0.70 0.74  CALCIUM 9.1 9.2   PT/INR No results for input(s): LABPROT, INR in the last 72 hours. CMP     Component Value Date/Time   NA 139 05/24/2017 0958   K 3.6 05/24/2017 0958   CL 105 05/24/2017 0958   CO2 26 05/24/2017 0958   GLUCOSE 103 (H) 05/24/2017 0958   BUN 10 05/24/2017 0958   CREATININE 0.74 05/24/2017 0958   CREATININE 0.85 03/24/2015 1547   CALCIUM 9.2 05/24/2017 0958   PROT 7.2 05/19/2017 2234   ALBUMIN 4.0 05/19/2017 2234   AST 16 05/19/2017 2234   ALT 10 (L) 05/19/2017 2234   ALKPHOS 62 05/19/2017 2234   BILITOT 0.8 05/19/2017 2234   GFRNONAA >60 05/24/2017 0958   GFRNONAA 73 03/24/2015 1547   GFRAA >60 05/24/2017 0958   GFRAA 84 03/24/2015 1547   Lipase     Component Value Date/Time   LIPASE 37 05/19/2017 2234        Studies/Results: Dg Abd Portable 1v  Result Date: 05/24/2017 CLINICAL DATA:  Small-bowel obstruction. EXAM: PORTABLE ABDOMEN - 1 VIEW COMPARISON:  05/23/2017.  CT 05/20/2017. FINDINGS: NG tube noted coiled stomach. No bowel distention or free air. Calcific density right upper quadrant consistent with known gallstone. Aortoiliac atherosclerotic vascular calcification. IMPRESSION: 1.  NG tube noted coiled stomach. 2. Calcific density right upper quadrant consistent known gallstone. No acute intra-abdominal abnormality. No bowel distention or free air. 3. Aortoiliac atherosclerotic vascular disease. Electronically Signed   By: Marcello Moores  Register   On: 05/24/2017 10:47    Anti-infectives: Anti-infectives (From admission, onward)   None       Assessment/Plan Aortic mural thrombus - patient stopped plavix 1 year ago Depression/Anxiety  Tobacco Use  SBO - hx of multiple abdominal surgeries - Having bowel function and tolerating diet - recommend soft diet at home for 1-2 weeks and bowel regimen  FEN: soft diet VTE: SCDs, SQ heparin ID: none  Plan:stable for discharge from a surgery standpoint since she is tolerating a diet and having bowel function     LOS: 5 days    Brigid Re , [facility] 05/25/2017, 8:31 AM Pager: [phone] Consults: [phone] Mon-Fri 7:00 am-4:30 pm Sat-Sun 7:00 am-11:30 am

## 2017-05-25 NOTE — Care Management Note (Signed)
Case Management Note  Patient Details  Name: Rachel Bryan MRN: [id number] Date of Birth: [birth year]  Subjective/Objective:    Admitted       SBO, hx of   previous small bowel obstruction, aortic mural thrombus, depression/anxiety, tobacco use.    PCP: Marjie Skiff    Action/Plan: Transition to home when medically stable...no needs identified per NCM.  Expected Discharge Date:    05/25/2017              Expected Discharge Plan:  Home/Self Care  In-House Referral:     Discharge planning Services  CM Consult  Post Acute Care Choice:    Choice offered to:     DME Arranged:   N/A DME Agency:   N/A  HH Arranged:   N/A HH Agency:   N/A  Status of Service:  Completed, signed off  If discussed at [place] of Stay Meetings, dates discussed:    Additional Comments:  Sharin Mons, RN 05/25/2017, 11:36 AM

## 2017-05-25 NOTE — Plan of Care (Signed)
Patient is making progress toward discharge.  Patient is able to tolerate nutrition, no GI symptoms noted.

## 2017-05-25 NOTE — Progress Notes (Signed)
Family Medicine Teaching Service Daily Progress Note Intern Pager: [phone]  Patient name: Rachel Bryan Medical record number: [id number] Date of birth: 07/22/1950 Age: 67 y.o. Gender: female  Primary Care Provider: Marjie Skiff, MD Consultants: surgery Code Status: full  Pt Overview and Major Events to Date:  Admitted on 4/28, NG tube placed  Assessment and Plan:  Rachel Bryan is a 68 y.o. female presenting with nausea, vomiting, and abdominal pain consistent with small bowel obstruction. PMH is significant for previous small bowel obstruction, aortic mural thrombus, depression/anxiety, tobacco use.   Small Bowel Obstruction, resolved:  NG clamped and patient tolerated clear liquid diet and now soft diet. Requiring no fentanyl for days now. Patient with bm x1 overnight and reports mobilizing.  - appreciate surgery recommendations  - stopped IVF this am, patient to have soft diet - ambulate patient in the hall Q4H - zofran 4 mg IV Q6H PRN for nausea  Hypokalemia, resolved: Patient with potassium of 4.1 on 5/01. - daily BMP  Depression/Anxiety: Takes Celexa 20 mg daily and trazodone 50-100 mg QHS. - continue home medications  Tobacco use: Smokes 1 ppd, but declines nicotine patch because her doctor told her it was bad for her heart. - nicotine patch on patient request  Hematuria: Patient found to have Hgb in urine with RBCs found in the past.  With her history of smoking, she is at higher risk for bladder or renal malignancy.   - follow up with urology outpatient  FEN/GI: Soft diet Prophylaxis: heparin  Disposition: stable for discharge if tolerates soft diet  Subjective:  Patient feeling better this am and anxious to go home.   Objective: Temp:  [98.1 F (36.7 C)-98.7 F (37.1 C)] 98.7 F (37.1 C) (05/03 0541) Pulse Rate:  [70-79] 70 (05/03 0541) Resp:  [14-20] 18 (05/03 0541) BP: (104-119)/(65-100) 104/65 (05/03 0541) SpO2:  [96 %-100 %] 99 % (05/03  0541) Physical Exam: General: NAD, pleasant Neck: Supple, no LAD Cardiovascular: RRR, no m/r/g, no LE edema Respiratory: CTA BL, normal work of breathing Gastrointestinal: soft, nontender, nondistended, normoactive BS MSK: moves 4 extremities equally Derm: no rashes appreciated Neuro: CN II-XII grossly intact Psych: AO, appropriate affect  Laboratory: Recent Labs  Lab 05/21/17 0416 05/23/17 0604 05/24/17 0958  WBC 8.5 8.5 7.1  HGB 12.2 11.6* 12.5  HCT 38.5 37.7 39.4  PLT 255 232 271   Recent Labs  Lab 05/19/17 2234  05/22/17 0430 05/23/17 0604 05/24/17 0958  NA 137   < > 146* 143 139  K 3.4*   < > 3.4* 4.1 3.6  CL 104   < > 103 105 105  CO2 24   < > 33* 29 26  BUN 16   < > 16 15 10   CREATININE 0.92   < > 0.74 0.70 0.74  CALCIUM 9.8   < > 9.3 9.1 9.2  PROT 7.2  --   --   --   --   BILITOT 0.8  --   --   --   --   ALKPHOS 62  --   --   --   --   ALT 10*  --   --   --   --   AST 16  --   --   --   --   GLUCOSE 113*   < > 109* 110* 103*   < > = values in this interval not displayed.    Imaging/Diagnostic Tests: Dg Abd Portable 1v  Result  Date: 05/25/2017 CLINICAL DATA:  Abdominal pain EXAM: PORTABLE ABDOMEN - 1 VIEW COMPARISON:  the previous day's study FINDINGS: Removal of nasogastric tube. Normal bowel gas pattern. Calcified gallstone. Mild spondylitic changes in the lower lumbar spine. Visualized lung bases clear. IMPRESSION: Normal bowel gas pattern. Cholelithiasis. Electronically Signed   By: Lucrezia Europe M.D.   On: 05/25/2017 09:29   Dg Abd Portable 1v  Result Date: 05/24/2017 CLINICAL DATA:  Small-bowel obstruction. EXAM: PORTABLE ABDOMEN - 1 VIEW COMPARISON:  05/23/2017.  CT 05/20/2017. FINDINGS: NG tube noted coiled stomach. No bowel distention or free air. Calcific density right upper quadrant consistent with known gallstone. Aortoiliac atherosclerotic vascular calcification. IMPRESSION: 1.  NG tube noted coiled stomach. 2. Calcific density right upper quadrant  consistent known gallstone. No acute intra-abdominal abnormality. No bowel distention or free air. 3. Aortoiliac atherosclerotic vascular disease. Electronically Signed   By: Marcello Moores  Register   On: 05/24/2017 10:47    Rachel Ruane, [name], DO 05/25/2017, 9:36 AM PGY-1, [place] Intern pager: [phone], text pages welcome

## 2017-05-25 NOTE — Discharge Instructions (Signed)
You were admitted for a small bowel obstruction. We are glad that you are doing so much better! Please continue with a soft diet for a few days and nothing hard to digest for a few weeks, as discussed.   Please follow up with family medicine center on 05/30/17 at 2:30 pm.

## 2017-05-25 NOTE — Progress Notes (Signed)
Nsg Discharge Note  Admit Date:  05/19/2017 Discharge date: 05/25/2017   Malachi Paradise Koopmann to be D/C'd Home per MD order.  AVS completed.  Copy for chart, and copy for patient signed, and dated. Patient/caregiver able to verbalize understanding.  Discharge Medication: Allergies as of 05/25/2017      Reactions   Levaquin [levofloxacin] Other (See Comments)   Per pt report.  Burning veins   Penicillins Hives, Palpitations   Has patient had a PCN reaction causing immediate rash, facial/tongue/throat swelling, SOB or lightheadedness with hypotension: yes Has patient had a PCN reaction causing severe rash involving mucus membranes or skin necrosis: yes Has patient had a PCN reaction that required hospitalization: no Has patient had a PCN reaction occurring within the last 10 years: no If all of the above answers are "NO", then may proceed with Cephalosporin use.   Codeine Hives, Other (See Comments)   sweating   Demerol [meperidine] Hives   Morphine And Related Other (See Comments)   Hallucinations   Sulfa Antibiotics Hives      Medication List    STOP taking these medications   baclofen 10 MG tablet Commonly known as:  LIORESAL   predniSONE 50 MG tablet Commonly known as:  DELTASONE   promethazine 25 MG tablet Commonly known as:  PHENERGAN     TAKE these medications   acetaminophen 325 MG tablet Commonly known as:  TYLENOL Take 650 mg by mouth every 6 (six) hours as needed for mild pain.   albuterol 108 (90 Base) MCG/ACT inhaler Commonly known as:  PROVENTIL HFA;VENTOLIN HFA Inhale 2 puffs into the lungs every 6 (six) hours as needed for wheezing or shortness of breath.   ATTENDS BREATHABLE BRIEFS MED Misc 1 application by Does not apply route daily as needed.   citalopram 20 MG tablet Commonly known as:  CELEXA take 1 tablet by mouth once daily   clopidogrel 75 MG tablet Commonly known as:  PLAVIX Take 75 mg by mouth daily.   docusate sodium 100 MG capsule Commonly  known as:  COLACE Take 1 capsule (100 mg total) by mouth 2 (two) times daily.   ibuprofen 600 MG tablet Commonly known as:  ADVIL,MOTRIN Take 1 tablet (600 mg total) by mouth every 8 (eight) hours as needed. What changed:  reasons to take this   minoxidil 2 % external solution Commonly known as:  MINOXIDIL FOR WOMEN Apply topically 2 (two) times daily.   polyethylene glycol packet Commonly known as:  MIRALAX / GLYCOLAX Take 17 g by mouth daily.   topiramate 25 MG tablet Commonly known as:  TOPAMAX Take 4 tablets (100 mg total) by mouth daily.   traZODone 50 MG tablet Commonly known as:  DESYREL take 1/2 to 1 tablets by mouth at bedtime if needed for sleep What changed:    how much to take  how to take this  when to take this  reasons to take this  additional instructions   vitamin B-12 1000 MCG tablet Commonly known as:  CYANOCOBALAMIN Take 1,000 mcg by mouth daily.       Discharge Assessment: Vitals:   05/24/17 2120 05/25/17 0541  BP: 119/80 104/65  Pulse: 78 70  Resp: 20 18  Temp: 98.4 F (36.9 C) 98.7 F (37.1 C)  SpO2: 100% 99%   Skin clean, dry and intact without evidence of skin break down, no evidence of skin tears noted. IV catheter discontinued intact. Site without signs and symptoms of complications - no redness  or edema noted at insertion site, patient denies c/o pain - only slight tenderness at site.  Dressing with slight pressure applied.  D/c Instructions-Education: Discharge instructions given to patient/family with verbalized understanding. D/c education completed with patient/family including follow up instructions, medication list, d/c activities limitations if indicated, with other d/c instructions as indicated by MD - patient able to verbalize understanding, all questions fully answered. Patient instructed to return to ED, call 911, or call MD for any changes in condition.  Patient escorted via [place], and D/C home via private auto.  Salley Slaughter, RN 05/25/2017 1:05 PM

## 2017-05-30 ENCOUNTER — Inpatient Hospital Stay: Payer: Medicare HMO | Admitting: Internal Medicine

## 2017-08-01 ENCOUNTER — Ambulatory Visit: Payer: Medicare HMO | Admitting: Family Medicine

## 2017-08-08 ENCOUNTER — Other Ambulatory Visit: Payer: Self-pay | Admitting: *Deleted

## 2017-08-08 MED ORDER — CITALOPRAM HYDROBROMIDE 20 MG PO TABS: 20.0000 mg | ORAL_TABLET | Freq: Every day | ORAL | 3 refills | Status: DC

## 2017-08-15 ENCOUNTER — Ambulatory Visit: Payer: Medicare HMO | Admitting: Family Medicine

## 2017-10-23 ENCOUNTER — Ambulatory Visit: Payer: Medicare HMO | Admitting: Family Medicine

## 2018-01-17 ENCOUNTER — Ambulatory Visit: Payer: Medicare Other | Admitting: Family Medicine

## 2018-01-25 ENCOUNTER — Ambulatory Visit: Payer: Medicare Other | Admitting: Family Medicine

## 2018-02-12 ENCOUNTER — Ambulatory Visit: Payer: Medicare HMO | Admitting: Family Medicine

## 2018-06-07 ENCOUNTER — Ambulatory Visit: Payer: Medicare HMO | Admitting: Family Medicine

## 2018-07-10 ENCOUNTER — Other Ambulatory Visit: Payer: Self-pay | Admitting: *Deleted

## 2018-07-10 MED ORDER — CITALOPRAM HYDROBROMIDE 20 MG PO TABS: 20.0000 mg | ORAL_TABLET | Freq: Every day | ORAL | 3 refills | Status: DC

## 2018-09-06 ENCOUNTER — Other Ambulatory Visit: Payer: Self-pay | Admitting: Family Medicine

## 2018-09-06 DIAGNOSIS — G4700 Insomnia, unspecified: Secondary | ICD-10-CM

## 2018-09-10 NOTE — Telephone Encounter (Signed)
Ms. Sabina has not been seen in out clinic in about two years.  She is likely out of all of her medication.  I would like to see her again in clinic before prescribing her medication. Please call and ask her to schedule an appointment to discuss medication and refills.   Thank you,  Matilde Haymaker, MD

## 2018-09-11 NOTE — Telephone Encounter (Signed)
Patient informed and appt made for 09-18-2018. Rachel Bryan,CMA

## 2018-09-18 ENCOUNTER — Telehealth (INDEPENDENT_AMBULATORY_CARE_PROVIDER_SITE_OTHER): Payer: Medicare HMO | Admitting: Family Medicine

## 2018-09-18 ENCOUNTER — Other Ambulatory Visit: Payer: Self-pay

## 2018-09-18 DIAGNOSIS — I7410 Embolism and thrombosis of unspecified parts of aorta: Secondary | ICD-10-CM

## 2018-09-18 DIAGNOSIS — G4700 Insomnia, unspecified: Secondary | ICD-10-CM

## 2018-09-18 DIAGNOSIS — F419 Anxiety disorder, unspecified: Secondary | ICD-10-CM

## 2018-09-18 DIAGNOSIS — R2689 Other abnormalities of gait and mobility: Secondary | ICD-10-CM | POA: Insufficient documentation

## 2018-09-18 MED ORDER — TRAZODONE HCL 50 MG PO TABS: 50.0000 mg | ORAL_TABLET | Freq: Every evening | ORAL | 2 refills | Status: DC | PRN

## 2018-09-18 MED ORDER — CLOPIDOGREL BISULFATE 75 MG PO TABS: 75.0000 mg | ORAL_TABLET | Freq: Every day | ORAL | 2 refills | Status: DC

## 2018-09-18 NOTE — Assessment & Plan Note (Addendum)
Likely exacerbated by her increased anxiety at this time and inappropriate use of Celexa (only taking when experiencing severe bouts of anxiety). -Refill trazodone -Encourage appropriate use of Celexa -Continue melatonin

## 2018-09-18 NOTE — Assessment & Plan Note (Signed)
Chart review shows no documentation of discontinuation of Plavix.  She was encouraged to continue taking her Plavix. -Refill Plavix

## 2018-09-18 NOTE — Progress Notes (Addendum)
[place] Telemedicine Visit  Patient consented to have virtual visit. Method of visit: Telephone  Encounter participants: Patient: Rachel Bryan - located at home Provider: Matilde Haymaker - located at [facility] Others (if applicable): none  Chief Complaint: I've got some issues going on  HPI:  Instability/imbalance The chief complaint of Rachel Bryan was her balance difficulty.  She reports that "I feel like I'm falling.  I'll be walking around the room here and I feel like I'm falling."  She appears to be having spells of imbalance.  These do not seem to be related to orthostatic hypotension.  They can occur at any time and not only when rising from a seated position.  She reports that she was recently standing washing the dishes when she suddenly felt like she had to fall over and she was caught by her boyfriend.  Pt has a walker at home but doesn't want to use it.  She has not previously been diagnosed with vertigo or any other balance issue.  She does have a walker at home which she was given following an abdominal surgery but she has not used it for some time.  She reports that she is unable to come to the clinic due to transportation issues.  There is no one lives at home with her who has transportation.  In order to pick up her medication, she will either have to have an outside relative pick it up at a pharmacy or have her boyfriend walked 7 miles to pick up her medication.  Insomnia Additionally, she reports difficulty sleeping.  She reports that "I can't go to sleep until about 4 in the morning. I take melatonin, I'm out of Trazodone. When I get up to go to the bathroom, I need someone to take you because I'll fall."  She stated multiple times during our conversation that she has had increased anxiety.  Anxiety Rachel Bryan has a history of anxiety and now reports that "my nerves are shot."  Pt takes Celexa 20 mg. She only takes it when having an a panic attack.  She did  not realize that this medication is intended to be taken daily in order to prevent/minimize panic attacks.  Abdominal aortic mural thrombus While discussing her medication, she reported that she had a history of a "blood clot by my aorta" for which she used to take Plavix although she stopped taking it because she ran out.  She says that no doctor told her to stop taking the medication she only discontinue the medication because she ran out.  ROS: per HPI  Pertinent PMHx: Insomnia, anxiety  Exam:  Respiratory: Breathing comfortably on room air.  No difficulty completing long sentences without issue.  Assessment/Plan:  Overall, this is a patient who needs to be seen in clinic.  She has a number of medical problems that have been poorly addressed to her absence from clinic.  She reports that she is concerned about coming to clinic due to [place] although her main barrier to being seen in clinic is transportation.  She does not have access to any personal transportation [place].  She reports that the cost of a taxi or transportation service simply too expensive. -Message to Casimer Lanius regarding resources for patient transportation  Aortic mural thrombus [name]) Chart review shows no documentation of discontinuation of Plavix.  She was encouraged to continue taking her Plavix. -Refill Plavix  Anxiety Left first open better control of her anxiety would be to encourage compliance of taking her medication daily.  She is informed that her medication works best when taken every day and she would not receive the full benefit if she only took when she was experiencing bouts of anxiety/panic attacks.  She acknowledged understanding. -Celexa 20 mg daily  Insomnia Likely exacerbated by her increased anxiety at this time and inappropriate use of Celexa (only taking when experiencing severe bouts of anxiety). -Refill  trazodone -Encourage appropriate use of Celexa -Continue melatonin  Impairment of balance Difficult to assess over the phone.  Unlikely related to orthostatic hypotension based on history.  Patient was encouraged to drink plenty of fluids during the day (no history of heart failure or kidney failure).  Possibly a side effect of Topamax.  Patient was informed that we will try to provide resources so that she could be assessed in clinic. -Message Casimer Lanius regarding transportation resources    Time spent during visit with patient: 15 minutes

## 2018-09-18 NOTE — Assessment & Plan Note (Addendum)
Difficult to assess over the phone.  Unlikely related to orthostatic hypotension based on history.  Patient was encouraged to drink plenty of fluids during the day (no history of heart failure or kidney failure).  Possibly a side effect of Topamax.  Patient was informed that we will try to provide resources so that she could be assessed in clinic. -Message Casimer Lanius regarding transportation resources

## 2018-09-18 NOTE — Assessment & Plan Note (Signed)
Left first open better control of her anxiety would be to encourage compliance of taking her medication daily.  She is informed that her medication works best when taken every day and she would not receive the full benefit if she only took when she was experiencing bouts of anxiety/panic attacks.  She acknowledged understanding. -Celexa 20 mg daily

## 2018-09-18 NOTE — Progress Notes (Signed)
120#  113/68  76bpm  [facility] [id number] - [place], [place]

## 2018-09-19 ENCOUNTER — Telehealth: Payer: Self-pay | Admitting: *Deleted

## 2018-09-19 NOTE — Telephone Encounter (Signed)
LM for patient on home number ok per DPR in chart with transportation information.  Asked her to call us back once this has been set up.  Bryceton Hantz,CMA

## 2018-09-19 NOTE — Telephone Encounter (Signed)
-----   Message from Matilde Haymaker, MD sent at 09/18/2018  5:03 PM EDT ----- Regarding: Schedule clinic visit and transportation This patient was called today for a telemedicine encounter related to balance impairment.  It would be incredibly helpful for this patient to be seen in clinic.  At the time of the encounter, she was not aware of any way she could get transportation to the clinic.  Please call this patient and provide the information below.  She has Medicaid and Medicare and qualifies for Medicaid transportation.  As far as I understand, she should be able to get this transportation free of charge.  Please have her schedule an appointment here in clinic at her earliest convenience to be assessed for her balance impairment.  Transportation Resources: Medicaid Transportation: Must have Medicaid, will transport to medical appointments and pharmacy.  Call Department of Social Services to register and schedule ride. [id number]  or 3 [phone]   Thank you, Matilde Haymaker, MD

## 2018-09-24 ENCOUNTER — Encounter: Payer: Self-pay | Admitting: Family Medicine

## 2018-10-04 ENCOUNTER — Telehealth: Payer: Self-pay | Admitting: Licensed Clinical Social Worker

## 2018-10-04 ENCOUNTER — Encounter: Payer: Self-pay | Admitting: Licensed Clinical Social Worker

## 2018-10-04 NOTE — Telephone Encounter (Addendum)
   Care Coordination Phone outreach Note Social Work   10/04/2018 Name: Rachel Bryan MRN: [id number] DOB: 07-17-1950  Rachel Bryan is a 68 y.o. year old female who sees Matilde Haymaker, MD for primary care. LCSW was consulted for assistance with Transportation Needs . Called patient to assess needs and barriers. Assessment : Patient called the Department of Social Services but was not successful in setting up her transportation. Patients is frustrated and discouraged.   Interventions:LCSW called DSS with patient on the phone to offer support. DSS will call patient back at the phone number she provided to complete her transportation assessment. Also informed patient to contact [employer] to see if she has transportation services as part of her insurance plan. Other interventions: . Solution-Focused Strategies,   . Consult MD   Plan: Client will call [facility] office to schedule her appointment once DSS calls her back 1. Per DSS they will call patient within 24 to 48 hours. 2. Patient will also call [employer] to explore transportation options   Dr. Pilar Plate has been informed of this outreach and plan.  Casimer Lanius, LCSW Clinical Social Worker [place] / [place]   [phone] 10:14 AM

## 2018-10-15 ENCOUNTER — Encounter: Payer: Self-pay | Admitting: Family Medicine

## 2018-10-18 ENCOUNTER — Ambulatory Visit: Payer: Medicare HMO | Admitting: Family Medicine

## 2018-10-29 ENCOUNTER — Other Ambulatory Visit: Payer: Self-pay

## 2018-10-29 ENCOUNTER — Telehealth (INDEPENDENT_AMBULATORY_CARE_PROVIDER_SITE_OTHER): Payer: Medicare HMO | Admitting: Family Medicine

## 2018-10-29 DIAGNOSIS — R059 Cough, unspecified: Secondary | ICD-10-CM

## 2018-10-29 DIAGNOSIS — R05 Cough: Secondary | ICD-10-CM

## 2018-10-29 MED ORDER — CITALOPRAM HYDROBROMIDE 20 MG PO TABS: 40.0000 mg | ORAL_TABLET | Freq: Every day | ORAL | 3 refills | Status: DC

## 2018-10-29 MED ORDER — TOPIRAMATE 25 MG PO TABS: 100.0000 mg | ORAL_TABLET | Freq: Every day | ORAL | 2 refills | Status: DC

## 2018-10-29 NOTE — Progress Notes (Signed)
[place] Telemedicine Visit  Patient consented to have virtual visit. Method of visit: Telephone  Encounter participants: Patient: Rachel Bryan - located at home Provider: Matilde Haymaker - located at [facility] Others (if applicable): none  Chief Complaint: cough  HPI: Lower respiratory tract infection Ms. Skowron reports that she has had significant cough and headache for about 1 week.  She does have a smoker's cough but is noticed significant worsening in addition to large amounts of clear sputum production.  She denies fever, chest pain, sore throat, nasal congestion, shortness of breath.  She has been taking many precautions to socially distance in the setting of coronavirus.  She wanted to know can be done for symptom relief for her at home.  Her headaches are very uncomfortable.  She thought it was bad idea to be seen in the clinic due to her respiratory symptoms.  She does not have any known exposure to coronavirus.  She is currently eating and drinking without difficulty.  She is clearly very anxious about her current illness has noticed no significant change in her anxiety from our last conversation.  She expressed concern about becoming forgetful and wanted to know about the possibility of her having dementia.  Smokes 2 ppd  ROS: per HPI  Pertinent PMHx: Though she smokes 2 packs/day, she has no diagnosis of COPD  Exam:  Respiratory: Breathing and talking comfortably over the phone.  Interrupted by occasional coughing.  Able to complete long sentences without issue.  Assessment/Plan:  Lower respiratory tract infection But like symptoms discussed she understands that she should be assessed in the emergency department if she is having any trouble breathing, fevers or worsening symptoms. -Covid test ordered -Topiramate refilled for headaches -Celexa increased to 40 mg daily -Come in for clinic visit following resolution of current illness to discuss dementia  and other concerns that better assessed in person.  Time spent during visit with patient: 25 minutes

## 2018-12-01 ENCOUNTER — Encounter: Payer: Self-pay | Admitting: Family Medicine

## 2018-12-02 ENCOUNTER — Encounter: Payer: Self-pay | Admitting: Family Medicine

## 2018-12-12 ENCOUNTER — Encounter: Payer: Self-pay | Admitting: Family Medicine

## 2018-12-13 ENCOUNTER — Ambulatory Visit: Payer: Medicare HMO | Admitting: Family Medicine

## 2019-01-14 ENCOUNTER — Ambulatory Visit: Payer: Medicare HMO | Admitting: Family Medicine

## 2019-02-05 ENCOUNTER — Telehealth (INDEPENDENT_AMBULATORY_CARE_PROVIDER_SITE_OTHER): Payer: Medicare HMO | Admitting: Family Medicine

## 2019-02-05 ENCOUNTER — Other Ambulatory Visit: Payer: Self-pay

## 2019-02-05 ENCOUNTER — Ambulatory Visit: Payer: Self-pay | Admitting: Licensed Clinical Social Worker

## 2019-02-05 DIAGNOSIS — S91359A Open bite, unspecified foot, initial encounter: Secondary | ICD-10-CM | POA: Insufficient documentation

## 2019-02-05 DIAGNOSIS — S91351A Open bite, right foot, initial encounter: Secondary | ICD-10-CM | POA: Diagnosis not present

## 2019-02-05 DIAGNOSIS — W540XXA Bitten by dog, initial encounter: Secondary | ICD-10-CM

## 2019-02-05 MED ORDER — SILVER SULFADIAZINE 1 % EX CREA: 1.0000 "application " | TOPICAL_CREAM | Freq: Every day | CUTANEOUS | 2 refills | Status: DC

## 2019-02-05 MED ORDER — DOXYCYCLINE HYCLATE 100 MG PO TABS: 100.0000 mg | ORAL_TABLET | Freq: Two times a day (BID) | ORAL | 0 refills | Status: AC

## 2019-02-05 MED ORDER — CLINDAMYCIN HCL 300 MG PO CAPS: 300.0000 mg | ORAL_CAPSULE | Freq: Three times a day (TID) | ORAL | 0 refills | Status: AC

## 2019-02-05 NOTE — Chronic Care Management (AMB) (Signed)
  Care Management   Clinical Social Work   02/05/2019 Name: LORRIANNE REYBURN MRN: [id number] DOB: 1950-08-31  CATIE ECHARD is a 69 y.o. year old female who is a primary care patient of Matilde Haymaker, MD . LCSW was consulted by Dr. Tarry Kos for assistance with Transportation Needs  today so that patient can go to Urgent care.  Patient reports having no transportation and money to pay for transportation.   LCSW reached out to [place] today by phone to introduce self, assess needs and provide intervention. Assessment: Patient states she has contacted her daughter who is able to take her to Urgent Care today at 4:30  Intervention: Solution focus intervention with patient for transportation options Review of patient status, including review of consultants reports, relevant laboratory and other test results, and collaboration with appropriate care team members and the patient's provider was performed as part of comprehensive patient evaluation and provision of care management services.    Plan:  No further follow up is required by LCSW at this time.  PCP will place formal referral is ongoing services and support are needed.  Casimer Lanius, LCSW Clinical Social Worker [place] / [place]   [phone] 2:12 PM

## 2019-02-05 NOTE — Progress Notes (Signed)
[place] Telemedicine Visit  Patient consented to have virtual visit. Method of visit: Video  Encounter participants: Patient: Rachel Bryan - located at Home Provider: Danna Hefty - located at [facility] Others (if applicable): Gift's Daughter in law - Melissa   Chief Complaint: Dog Bite  HPI: Patient notes she was bit by a dog on her dorsal right foot x 2 days ago. Denies any other bites. Notes it was really red yesterday. Has treated it with Peroxide x 2 days. Swelling is getting worse. Redness is "improving". Very painful, unable to put weight on it. Denies any fevers or chills. Denies any puss or bleeding. No immunizations in her chart. She is due for her Tetanus vaccine. Notes her dog is not up to date on their rabies shot, however dog has not demonstrated any symptoms of rabies.  She is not sure what prompted her dog to bite her, he is not aggressive and was not being aggressive. She has difficulty with transportation and is unable to afford transportation to get to the clinic until she gets her social security check. She has medicare but has no transportation resources by them.. Daughter in law and son works and they have to pay for transportation every day.  Her daughter may be able to take her to urgent care after she gets out of work at [place]: per HPI  Pertinent PMHx: No pertinent PMH  Exam:  Gen: Well appearing Respiratory: Speaking in full sentences  Extremity: Right foot very swollen to about ankle involving toes, two puncture wounds located and base of 2nd and 3rd toes, redness extends along dorsal aspect of her foot approaching ankle and base of toes, does not extend onto toes. No bleeding or discharge appreciated.  Assessment/Plan:  Animal bite of foot Foot appears infected with extension of significant swelling and erythema along dorsal aspect of foot. No discharge, bleeding, or pus appreciated. Not up-to-date on TDAP and dog is not  vaccinated for rabies. Instructed patient to seek medical care at ED or urgent care immediately for further evaluation. Patient noted difficulties with transportation. Recommended she reach out to family or call EMS to obtain transportation to urgent care today. Provided address for an urgent care in close proximity to her home - [facility]. Also spoke to Casimer Lanius who will reach out to patient to help arrange transportation. Patient also plans to reach out to daughter in hopes she may be able to drive her to urgent care after she gets off work this evening. Provided antibiotics for suspected infection - Clindamycin 300mg  TID and Doxycyline 100mg  BID x 10 days. Also gave rx for Silvadene to be applied to wound daily. Discussed strict return precautions including developing fevers/chills, worsening pain/erythema/swelling despite antibiotics, neurologic symptoms, or discharge from bite wound. Again, highly recommended patient seek in-person evaluation today.    Time spent during visit with patient: 25 minutes  Mina Marble, [place], PGY2 02/05/19

## 2019-02-05 NOTE — Assessment & Plan Note (Signed)
Foot appears infected with extension of significant swelling and erythema along dorsal aspect of foot. No discharge, bleeding, or pus appreciated. Not up-to-date on TDAP and dog is not vaccinated for rabies. Instructed patient to seek medical care at ED or urgent care immediately for further evaluation. Patient noted difficulties with transportation. Recommended she reach out to family or call EMS to obtain transportation to urgent care today. Provided address for an urgent care in close proximity to her home - [facility]. Also spoke to Casimer Lanius who will reach out to patient to help arrange transportation. Patient also plans to reach out to daughter in hopes she may be able to drive her to urgent care after she gets off work this evening. Provided antibiotics for suspected infection - Clindamycin 300mg  TID and Doxycyline 100mg  BID x 10 days. Also gave rx for Silvadene to be applied to wound daily. Discussed strict return precautions including developing fevers/chills, worsening pain/erythema/swelling despite antibiotics, neurologic symptoms, or discharge from bite wound. Again, highly recommended patient seek in-person evaluation today.

## 2019-06-12 ENCOUNTER — Ambulatory Visit: Payer: Medicare HMO | Admitting: Family Medicine

## 2019-06-12 NOTE — Progress Notes (Deleted)
    SUBJECTIVE:   CHIEF COMPLAINT / HPI:   Vertigo  Anxiety -citalopram  Migraine -topiramate  Sleep -trazadone  HM -PNA/Tetanus/Covid -Colonoscopy -DEXA -Mammo -hep C   PERTINENT  PMH / PSH: ***  OBJECTIVE:   There were no vitals taken for this visit.  ***  ASSESSMENT/PLAN:   No problem-specific Assessment & Plan notes found for this encounter.     Matilde Haymaker, MD [place]

## 2019-08-26 ENCOUNTER — Ambulatory Visit: Payer: Medicare HMO | Admitting: Family Medicine

## 2019-09-01 ENCOUNTER — Ambulatory Visit: Payer: Medicare HMO | Admitting: Family Medicine

## 2019-09-08 ENCOUNTER — Ambulatory Visit: Payer: Medicare HMO | Admitting: Family Medicine

## 2019-11-06 ENCOUNTER — Ambulatory Visit (INDEPENDENT_AMBULATORY_CARE_PROVIDER_SITE_OTHER): Payer: Medicare HMO

## 2019-11-06 ENCOUNTER — Other Ambulatory Visit: Payer: Self-pay

## 2019-11-06 DIAGNOSIS — Z0000 Encounter for general adult medical examination without abnormal findings: Secondary | ICD-10-CM | POA: Diagnosis not present

## 2019-11-06 NOTE — Progress Notes (Addendum)
Subjective:   Rachel Bryan is a 69 y.o. female who presents for Medicare Annual (Subsequent) preventive examination.  Patient consented to have virtual visit and was identified by name and date of birth. Method of visit: Telephone  Encounter participants: Patient: Rachel Bryan - located at Home Nurse/Provider: Dorna Bloom - located at [facility] Others (if applicable): No one  Review of Systems: Defer to PCP  Cardiac Risk Factors include: advanced age (>61men, >42 women);sedentary lifestyle;smoking/ tobacco exposure  Objective:   Vitals: There were no vitals taken for this visit.  There is no height or weight on file to calculate BMI.  Advanced Directives 11/06/2019 05/21/2017 03/28/2016 08/05/2015 09/08/2014 06/29/2014 06/05/2014  Does Patient Have a Medical Advance Directive? No No No No No No;Yes No  Type of Advance Directive - - - - - Press photographer -  Does patient want to make changes to medical advance directive? - - - - - - -  Copy of [place] in Chart? - - - - - No - copy requested -  Would patient like information on creating a medical advance directive? No - Patient declined No - Patient declined No - Patient declined No - patient declined information No - patient declined information - -   Tobacco Social History   Tobacco Use  Smoking Status Current Every Day Smoker  . Packs/day: 1.00  Smokeless Tobacco Never Used     Ready to Quit: No Counseling Given: Yes Clinical Intake:  Pre-visit preparation completed: Yes  How often do you need to have someone help you when you read instructions, pamphlets, or other written materials from your doctor or pharmacy?: 3 - Sometimes What is the last grade level you completed in school?: 9th grade  Past Medical History:  Diagnosis Date  . Abnormality of gait 06/05/2014  . Anxiety   . Cluster headache   . Incontinence of urine   . Insomnia secondary to anxiety   . Lactose intolerance   . Memory  difficulties 06/05/2014   Past Surgical History:  Procedure Laterality Date  . ABDOMINAL HYSTERECTOMY    . APPENDECTOMY     Family History  Problem Relation Age of Onset  . Transient ischemic attack Mother   . Cancer Mother        lung (smoker)  . Anxiety disorder Mother   . Cancer Father        prostate  . Hypertension Father   . Mental retardation Sister   . Healthy Brother   . Healthy Brother   . Cancer Maternal Grandmother        leukemia   Social History   Socioeconomic History  . Marital status: Divorced    Spouse name: Not on file  . Number of children: 4  . Years of education: 9  . Highest education level: 9th grade  Occupational History  . Occupation: unemployed  Tobacco Use  . Smoking status: Current Every Day Smoker    Packs/day: 1.00  . Smokeless tobacco: Never Used  Vaping Use  . Vaping Use: Never used  Substance and Sexual Activity  . Alcohol use: No    Alcohol/week: 0.0 standard drinks  . Drug use: No  . Sexual activity: Yes    Birth control/protection: Surgical  Other Topics Concern  . Not on file  Social History Narrative   Patient lives with her finance, her son, his finance, and two grandchildren.    Patient enjoys spending time with her family  and taking care of her animals.    Patein enjoys watching TV and cooking when she can.       Social Determinants of Health   Financial Resource Strain: High Risk  . Difficulty of Paying Living Expenses: Hard  Food Insecurity: Food Insecurity Present  . Worried About Charity fundraiser in the Last Year: Sometimes true  . Ran Out of Food in the Last Year: Sometimes true  Transportation Needs: Unmet Transportation Needs  . Lack of Transportation (Medical): Yes  . Lack of Transportation (Non-Medical): Yes  Physical Activity: Inactive  . Days of Exercise per Week: 0 days  . Minutes of Exercise per Session: 0 min  Stress: Stress Concern Present  . Feeling of Stress : Very much  Social Connections:  Socially Isolated  . Frequency of Communication with Friends and Family: More than three times a week  . Frequency of Social Gatherings with Friends and Family: More than three times a week  . Attends Religious Services: Never  . Active Member of Clubs or Organizations: No  . Attends Archivist Meetings: Never  . Marital Status: Divorced   Outpatient Encounter Medications as of 11/06/2019  Medication Sig  . albuterol (PROVENTIL HFA;VENTOLIN HFA) 108 (90 Base) MCG/ACT inhaler Inhale 2 puffs into the lungs every 6 (six) hours as needed for wheezing or shortness of breath.  . citalopram (CELEXA) 20 MG tablet Take 2 tablets (40 mg total) by mouth daily.  Marland Kitchen docusate sodium (COLACE) 100 MG capsule Take 1 capsule (100 mg total) by mouth 2 (two) times daily.  . polyethylene glycol (MIRALAX / GLYCOLAX) packet Take 17 g by mouth daily.  . silver sulfADIAZINE (SILVADENE) 1 % cream Apply 1 application topically daily.  . vitamin B-12 (CYANOCOBALAMIN) 1000 MCG tablet Take 1,000 mcg by mouth daily.  Marland Kitchen acetaminophen (TYLENOL) 325 MG tablet Take 650 mg by mouth every 6 (six) hours as needed for mild pain. (Patient not taking: Reported on 11/06/2019)  . clopidogrel (PLAVIX) 75 MG tablet Take 1 tablet (75 mg total) by mouth daily. (Patient not taking: Reported on 11/06/2019)  . ibuprofen (ADVIL,MOTRIN) 600 MG tablet Take 1 tablet (600 mg total) by mouth every 8 (eight) hours as needed. (Patient not taking: Reported on 11/06/2019)  . Incontinence Supply Disposable (ATTENDS BREATHABLE BRIEFS MED) MISC 1 application by Does not apply route daily as needed. (Patient not taking: Reported on 11/06/2019)  . minoxidil (MINOXIDIL FOR WOMEN) 2 % external solution Apply topically 2 (two) times daily. (Patient not taking: Reported on 05/20/2017)  . topiramate (TOPAMAX) 25 MG tablet Take 4 tablets (100 mg total) by mouth daily. (Patient not taking: Reported on 11/06/2019)  . traZODone (DESYREL) 50 MG tablet Take 1-2  tablets (50-100 mg total) by mouth at bedtime as needed for sleep. (Patient not taking: Reported on 11/06/2019)   No facility-administered encounter medications on file as of 11/06/2019.   Activities of Daily Living In your present state of health, do you have any difficulty performing the following activities: 11/06/2019  Hearing? N  Vision? Y  Difficulty concentrating or making decisions? N  Walking or climbing stairs? Y  Dressing or bathing? N  Doing errands, shopping? N  Preparing Food and eating ? N  Using the Toilet? N  In the past six months, have you accidently leaked urine? Y  Do you have problems with loss of bowel control? N  Managing your Medications? Y  Managing your Finances? N  Housekeeping or managing your Housekeeping? N  Some recent data might be hidden   Patient Care Team: Matilde Haymaker, MD as PCP - General (Family Medicine)    Assessment:   This is a routine wellness examination for [place].  Exercise Activities and Dietary recommendations Current Exercise Habits: The patient does not participate in regular exercise at present  Goals    . Quit Smoking     Cessation materials given.       Fall Risk Fall Risk  11/06/2019 08/05/2015 12/09/2014 11/21/2013 09/01/2013  Falls in the past year? 1 Yes Yes No Yes  Number falls in past yr: 1 - - - 1  Injury with Fall? 0 - - - Yes   Is the patient's home free of loose throw rugs in walkways, pet beds, electrical cords, etc?   yes      Grab bars in the bathroom? no      Handrails on the stairs?   no      Adequate lighting?   yes  Patient rating of health (0-10) scale: 3  Depression Screen PHQ 2/9 Scores 11/06/2019 03/22/2017 03/28/2016 08/05/2015  PHQ - 2 Score 6 1 1  0  PHQ- 9 Score 18 - - -    Cognitive Function MMSE - Mini Mental State Exam 06/05/2014  Orientation to time 4  Orientation to Place 4  Registration 3  Attention/ Calculation 5  Recall 2  Language- name 2 objects 2  Language- repeat 1   Language- follow 3 step command 2  Language- read & follow direction 1  Write a sentence 1  Copy design 1  Total score 26   Immunization History  Administered Date(s) Administered  . Influenza,inj,Quad PF,6+ Mos 11/21/2013, 12/09/2014  . Pneumococcal Polysaccharide-23 08/29/2013   Screening Tests Health Maintenance  Topic Date Due  . Hepatitis C Screening  Never done  . COVID-19 Vaccine (1) Never done  . TETANUS/TDAP  Never done  . COLONOSCOPY  Never done  . MAMMOGRAM  12/29/2016  . DEXA SCAN  Never done  . PNA vac Low Risk Adult (1 of 2 - PCV13) 02/28/2017  . INFLUENZA VACCINE  08/24/2019   Cancer Screenings: Lung: Low Dose CT Chest recommended if Age 24-80 years, 30 pack-year currently smoking OR have quit w/in 15years. Patient does qualify. Breast:  Up to date on Mammogram? No   Up to date of Bone Density/Dexa? No Colorectal: Has never had one.     Plan:  PCP apt for 11/28/2019. 1-800-Quit Now for smoking cessation.  Consider Flu Vaccine and Covid Vaccine.   I have personally reviewed and noted the following in the patient's chart:   . Medical and social history . Use of alcohol, tobacco or illicit drugs  . Current medications and supplements . Functional ability and status . Nutritional status . Physical activity . Advanced directives . List of other physicians . Hospitalizations, surgeries, and ER visits in previous 12 months . Vitals . Screenings to include cognitive, depression, and falls . Referrals and appointments  In addition, I have reviewed and discussed with patient certain preventive protocols, quality metrics, and best practice recommendations. A written personalized care plan for preventive services as well as general preventive health recommendations were provided to patient.  This visit was conducted virtually in the setting of the [place] pandemic.    Dorna Bloom, [place]  11/06/2019    I have reviewed this visit and agree with the  documentation.  Matilde Haymaker, MD

## 2019-11-06 NOTE — Patient Instructions (Signed)
You spoke to Rachel Bryan, [place] over the phone for your annual wellness visit.  We discussed goals: Goals    . Quit Smoking     Cessation materials given.       We also discussed recommended health maintenance.  As discussed, you are due for a variety things. Please refer to below. We have scheduled you an apt with PCP for 11/28/2019 _0 :10pm.  Health Maintenance  Topic Date Due  . Hepatitis C Screening  Never done  . COVID-19 Vaccine (1) Never done  . TETANUS/TDAP  Never done  . COLONOSCOPY  Never done  . MAMMOGRAM  12/29/2016  . DEXA SCAN  Never done  . PNA vac Low Risk Adult (1 of 2 - PCV13) 02/28/2017  . INFLUENZA VACCINE  08/24/2019   PCP apt for 11/28/2019. 1-800-Quit Now for smoking cessation.  Consider Flu Vaccine and Covid Vaccine.   We also discussed barriers to your wellness. I believe you would benefit from a variety of resources from our clinic. We have schedule you an apt to discuss.    Preventive Care 85 Years and Older, Female Preventive care refers to lifestyle choices and visits with your health care provider that can promote health and wellness. This includes:  A yearly physical exam. This is also called an annual well check.  Regular dental and eye exams.  Immunizations.  Screening for certain conditions.  Healthy lifestyle choices, such as diet and exercise. What can I expect for my preventive care visit? Physical exam Your health care provider will check:  Height and weight. These may be used to calculate body mass index (BMI), which is a measurement that tells if you are at a healthy weight.  Heart rate and blood pressure.  Your skin for abnormal spots. Counseling Your health care provider may ask you questions about:  Alcohol, tobacco, and drug use.  Emotional well-being.  Home and relationship well-being.  Sexual activity.  Eating habits.  History of falls.  Memory and ability to understand (cognition).  Work and work  Statistician.  Pregnancy and menstrual history. What immunizations do I need?  Influenza (flu) vaccine  This is recommended every year. Tetanus, diphtheria, and pertussis (Tdap) vaccine  You may need a Td booster every 10 years. Varicella (chickenpox) vaccine  You may need this vaccine if you have not already been vaccinated. Zoster (shingles) vaccine  You may need this after age 42. Pneumococcal conjugate (PCV13) vaccine  One dose is recommended after age 80. Pneumococcal polysaccharide (PPSV23) vaccine  One dose is recommended after age 63. Measles, mumps, and rubella (MMR) vaccine  You may need at least one dose of MMR if you were born in 1957 or later. You may also need a second dose. Meningococcal conjugate (MenACWY) vaccine  You may need this if you have certain conditions. Hepatitis A vaccine  You may need this if you have certain conditions or if you travel or work in places where you may be exposed to hepatitis A. Hepatitis B vaccine  You may need this if you have certain conditions or if you travel or work in places where you may be exposed to hepatitis B. Haemophilus influenzae type b (Hib) vaccine  You may need this if you have certain conditions. You may receive vaccines as individual doses or as more than one vaccine together in one shot (combination vaccines). Talk with your health care provider about the risks and benefits of combination vaccines. What tests do I need? Blood tests  Lipid  and cholesterol levels. These may be checked every 5 years, or more frequently depending on your overall health.  Hepatitis C test.  Hepatitis B test. Screening  Lung cancer screening. You may have this screening every year starting at age 34 if you have a 30-pack-year history of smoking and currently smoke or have quit within the past 15 years.  Colorectal cancer screening. All adults should have this screening starting at age 85 and continuing until age 4. Your  health care provider may recommend screening at age 82 if you are at increased risk. You will have tests every 1-10 years, depending on your results and the type of screening test.  Diabetes screening. This is done by checking your blood sugar (glucose) after you have not eaten for a while (fasting). You may have this done every 1-3 years.  Mammogram. This may be done every 1-2 years. Talk with your health care provider about how often you should have regular mammograms.  BRCA-related cancer screening. This may be done if you have a family history of breast, ovarian, tubal, or peritoneal cancers. Other tests  Sexually transmitted disease (STD) testing.  Bone density scan. This is done to screen for osteoporosis. You may have this done starting at age 32. Follow these instructions at home: Eating and drinking  Eat a diet that includes fresh fruits and vegetables, whole grains, lean protein, and low-fat dairy products. Limit your intake of foods with high amounts of sugar, saturated fats, and salt.  Take vitamin and mineral supplements as recommended by your health care provider.  Do not drink alcohol if your health care provider tells you not to drink.  If you drink alcohol: ? Limit how much you have to 0-1 drink a day. ? Be aware of how much alcohol is in your drink. In the [place] one drink equals one 12 oz bottle of beer (355 mL), one 5 oz glass of wine (148 mL), or one 1 oz glass of hard liquor (44 mL). Lifestyle  Take daily care of your teeth and gums.  Stay active. Exercise for at least 30 minutes on 5 or more days each week.  Do not use any products that contain nicotine or tobacco, such as cigarettes, e-cigarettes, and chewing tobacco. If you need help quitting, ask your health care provider.  If you are sexually active, practice safe sex. Use a condom or other form of protection in order to prevent STIs (sexually transmitted infections).  Talk with your health care provider  about taking a low-dose aspirin or statin. What's next?  Go to your health care provider once a year for a well check visit.  Ask your health care provider how often you should have your eyes and teeth checked.  Stay up to date on all vaccines. This information is not intended to replace advice given to you by your health care provider. Make sure you discuss any questions you have with your health care provider. Document Revised: 01/03/2018 Document Reviewed: 01/03/2018 Elsevier Patient Education  2020 [facility]'s number is [phone]. Please call with questions or concerns about what we discussed today.

## 2019-11-13 ENCOUNTER — Other Ambulatory Visit: Payer: Self-pay | Admitting: *Deleted

## 2019-11-13 NOTE — Telephone Encounter (Signed)
Error. Biddie Sebek, CMA  

## 2019-11-14 ENCOUNTER — Other Ambulatory Visit: Payer: Self-pay

## 2019-11-14 NOTE — Telephone Encounter (Signed)
Spoke with patient. She stated that she would like for all of her medication to go to [facility] because it is to hard for her to get to [facility].

## 2019-11-17 ENCOUNTER — Other Ambulatory Visit: Payer: Self-pay

## 2019-11-17 DIAGNOSIS — G4700 Insomnia, unspecified: Secondary | ICD-10-CM

## 2019-11-17 NOTE — Addendum Note (Signed)
Addended by: Salvatore Marvel on: 11/17/2019 03:08 PM   Modules accepted: Orders

## 2019-11-18 MED ORDER — CLOPIDOGREL BISULFATE 75 MG PO TABS: 75.0000 mg | ORAL_TABLET | Freq: Every day | ORAL | 0 refills | Status: DC

## 2019-11-18 MED ORDER — CITALOPRAM HYDROBROMIDE 20 MG PO TABS: 40.0000 mg | ORAL_TABLET | Freq: Every day | ORAL | 0 refills | Status: DC

## 2019-11-18 MED ORDER — BD SWAB SINGLE USE REGULAR PADS: 1.0000 [IU] | MEDICATED_PAD | Freq: Every day | 0 refills | Status: DC

## 2019-11-18 MED ORDER — TRAZODONE HCL 50 MG PO TABS: 50.0000 mg | ORAL_TABLET | Freq: Every evening | ORAL | 0 refills | Status: DC | PRN

## 2019-11-18 MED ORDER — TOPIRAMATE 25 MG PO TABS: 100.0000 mg | ORAL_TABLET | Freq: Every day | ORAL | 0 refills | Status: DC

## 2019-11-18 NOTE — Telephone Encounter (Signed)
This pt has not been seen at out clinic in 2 years.  I have provided 1 month of medicine. Please let her know that I will not prescribe additional refills until she is seen in clinic. Matilde Haymaker, MD

## 2019-11-20 NOTE — Telephone Encounter (Signed)
Received phone call from [facility] regarding diabetic supplies for patient. Patient is still requesting truemetrix glucometer and supplies.   Please advise  Talbot Grumbling, RN

## 2019-11-21 ENCOUNTER — Other Ambulatory Visit: Payer: Self-pay | Admitting: Family Medicine

## 2019-11-21 MED ORDER — BLOOD GLUCOSE MONITORING SUPPL DEVI: 1.0000 [IU] | Freq: Every day | 0 refills | Status: DC

## 2019-11-26 ENCOUNTER — Other Ambulatory Visit: Payer: Self-pay | Admitting: *Deleted

## 2019-11-26 NOTE — Telephone Encounter (Signed)
Pt needs script for lancets and strips for her meter.  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  I have pended the orders. Will forward to PCP to finish. Christen Bame, CMA

## 2019-11-27 MED ORDER — ACCU-CHEK SOFTCLIX LANCETS MISC: 12 refills | Status: DC

## 2019-11-27 MED ORDER — ACCU-CHEK GUIDE VI STRP: ORAL_STRIP | 12 refills | Status: DC

## 2019-11-28 ENCOUNTER — Ambulatory Visit: Payer: Medicare HMO | Admitting: Family Medicine

## 2019-12-05 ENCOUNTER — Other Ambulatory Visit: Payer: Self-pay | Admitting: Family Medicine

## 2019-12-05 DIAGNOSIS — G4700 Insomnia, unspecified: Secondary | ICD-10-CM

## 2019-12-09 ENCOUNTER — Other Ambulatory Visit: Payer: Self-pay | Admitting: Family Medicine

## 2019-12-09 DIAGNOSIS — Z748 Other problems related to care provider dependency: Secondary | ICD-10-CM

## 2019-12-09 NOTE — Telephone Encounter (Signed)
Please ask Ms. Such to schedule an appointment within the next month.  She has not been seen in our clinic in the last several years.

## 2019-12-09 NOTE — Addendum Note (Signed)
Addended by: Valerie Roys on: 12/09/2019 04:20 PM   Modules accepted: Orders

## 2019-12-09 NOTE — Telephone Encounter (Signed)
Spoke with patient and scheduled an appointment for December 2 with Dr. Pilar Plate.  She expressed concern for reliable transportation to get here.  Will place a ccm referral for assistance.  Javarus Dorner,CMA

## 2019-12-11 ENCOUNTER — Telehealth: Payer: Self-pay

## 2019-12-11 NOTE — Telephone Encounter (Signed)
    MA11/18/2021 1st Attempt  Name: Rachel Bryan   MRN: [id number]   DOB: 1950-08-07   AGE: 69 y.o.   GENDER: female   PCP Matilde Haymaker, MD.   12/11/19 Spoke with patient about [facility]. Let patient know that she would need to call Medicaid at least 3 days in advance to schedule her transportation.  Will follow up with patient next week.    Grenda Lora, AAS Paralegal, [place] . Embedded Care Coordination [facility]  [address], [place] [address] [email or web address]  [phone]   [email or web address]

## 2019-12-15 ENCOUNTER — Telehealth: Payer: Self-pay

## 2019-12-15 NOTE — Telephone Encounter (Signed)
    MA11/22/2021   Name: Rachel Bryan   MRN: [id number]   DOB: 1950-04-13   AGE: 69 y.o.   GENDER: female   PCP Matilde Haymaker, MD.   12/15/19 Spoke with patient she has information and know to call Medicaid transportaton 3 days prior to appointment.  Emailed information per patient request.  No additional resources are needed at this time. Closing referral.    Chord Takahashi, AAS Paralegal, [place] . Embedded Care Coordination [facility]  [facility] Management  [address],  [address] [email or web address]  [phone]   [email or web address]

## 2019-12-24 ENCOUNTER — Other Ambulatory Visit: Payer: Self-pay

## 2019-12-24 DIAGNOSIS — G4700 Insomnia, unspecified: Secondary | ICD-10-CM

## 2019-12-25 ENCOUNTER — Ambulatory Visit: Payer: Medicare HMO | Admitting: Family Medicine

## 2019-12-25 NOTE — Telephone Encounter (Signed)
Patient has an appt on 12/31/19.  Deema Juncaj,CMA

## 2019-12-31 ENCOUNTER — Ambulatory Visit: Payer: Medicare HMO | Admitting: Family Medicine

## 2020-01-01 ENCOUNTER — Other Ambulatory Visit: Payer: Self-pay | Admitting: Family Medicine

## 2020-01-07 ENCOUNTER — Ambulatory Visit: Payer: Medicare HMO | Admitting: Family Medicine

## 2020-01-07 DIAGNOSIS — R6889 Other general symptoms and signs: Secondary | ICD-10-CM | POA: Diagnosis not present

## 2020-01-24 ENCOUNTER — Other Ambulatory Visit: Payer: Self-pay | Admitting: Family Medicine

## 2020-01-30 ENCOUNTER — Other Ambulatory Visit: Payer: Self-pay | Admitting: Family Medicine

## 2020-03-08 DIAGNOSIS — R6889 Other general symptoms and signs: Secondary | ICD-10-CM | POA: Diagnosis not present

## 2020-04-12 ENCOUNTER — Other Ambulatory Visit: Payer: Self-pay | Admitting: Family Medicine

## 2020-05-10 ENCOUNTER — Ambulatory Visit: Payer: Medicare HMO | Admitting: Family Medicine

## 2020-06-14 ENCOUNTER — Ambulatory Visit: Payer: Medicare HMO | Admitting: Family Medicine

## 2020-06-24 ENCOUNTER — Ambulatory Visit: Payer: Medicare HMO | Admitting: Family Medicine

## 2020-07-14 ENCOUNTER — Ambulatory Visit (INDEPENDENT_AMBULATORY_CARE_PROVIDER_SITE_OTHER): Payer: Medicare Other | Admitting: Family Medicine

## 2020-07-14 ENCOUNTER — Other Ambulatory Visit: Payer: Self-pay

## 2020-07-14 ENCOUNTER — Encounter: Payer: Self-pay | Admitting: Family Medicine

## 2020-07-14 VITALS — BP 112/72 | HR 110 | Ht 67.0 in | Wt 112.4 lb

## 2020-07-14 DIAGNOSIS — I7410 Embolism and thrombosis of unspecified parts of aorta: Secondary | ICD-10-CM

## 2020-07-14 DIAGNOSIS — Z13220 Encounter for screening for lipoid disorders: Secondary | ICD-10-CM

## 2020-07-14 DIAGNOSIS — F4321 Adjustment disorder with depressed mood: Secondary | ICD-10-CM

## 2020-07-14 DIAGNOSIS — R531 Weakness: Secondary | ICD-10-CM | POA: Diagnosis not present

## 2020-07-14 DIAGNOSIS — Z1159 Encounter for screening for other viral diseases: Secondary | ICD-10-CM | POA: Insufficient documentation

## 2020-07-14 DIAGNOSIS — E7800 Pure hypercholesterolemia, unspecified: Secondary | ICD-10-CM | POA: Diagnosis not present

## 2020-07-14 DIAGNOSIS — E440 Moderate protein-calorie malnutrition: Secondary | ICD-10-CM

## 2020-07-14 DIAGNOSIS — Z5941 Food insecurity: Secondary | ICD-10-CM | POA: Diagnosis not present

## 2020-07-14 DIAGNOSIS — Z23 Encounter for immunization: Secondary | ICD-10-CM

## 2020-07-14 MED ORDER — MIRTAZAPINE 15 MG PO TABS: 15.0000 mg | ORAL_TABLET | Freq: Every day | ORAL | 6 refills | Status: DC

## 2020-07-14 NOTE — Patient Instructions (Addendum)
Stop the trazadone I sent in a new prescription for a medicine that should help with both sleep and depression Dr. Hartford Poli will call to set up a video appointment with you for counseling I will call tomorrow with your blood test results Please do get your mammogram You will get a pneumonia vaccine today.   I am sorry for your loss and for your lack of support.   Make an appointment to see me in 2-3 weeks

## 2020-07-15 ENCOUNTER — Encounter: Payer: Self-pay | Admitting: Family Medicine

## 2020-07-15 ENCOUNTER — Ambulatory Visit: Payer: Self-pay | Admitting: Licensed Clinical Social Worker

## 2020-07-15 LAB — CBC
Hematocrit: 38.9 % (ref 34.0–46.6)
Hemoglobin: 12.5 g/dL (ref 11.1–15.9)
MCH: 27.9 pg (ref 26.6–33.0)
MCHC: 32.1 g/dL (ref 31.5–35.7)
MCV: 87 fL (ref 79–97)
Platelets: 279 10*3/uL (ref 150–450)
RBC: 4.48 x10E6/uL (ref 3.77–5.28)
RDW: 12.3 % (ref 11.7–15.4)
WBC: 8.3 10*3/uL (ref 3.4–10.8)

## 2020-07-15 LAB — LIPID PANEL
Chol/HDL Ratio: 3.3 ratio (ref 0.0–4.4)
Cholesterol, Total: 192 mg/dL (ref 100–199)
HDL: 59 mg/dL (ref >39)
LDL Chol Calc (NIH): 116 mg/dL — ABNORMAL HIGH (ref 0–99)
Triglycerides: 95 mg/dL (ref 0–149)
VLDL Cholesterol Cal: 17 mg/dL (ref 5–40)

## 2020-07-15 LAB — TSH: TSH: 1.42 u[IU]/mL (ref 0.450–4.500)

## 2020-07-15 LAB — CMP14+EGFR
ALT: 10 IU/L (ref 0–32)
AST: 13 IU/L (ref 0–40)
Albumin/Globulin Ratio: 1.7 (ref 1.2–2.2)
Albumin: 4.4 g/dL (ref 3.8–4.8)
Alkaline Phosphatase: 74 IU/L (ref 44–121)
BUN/Creatinine Ratio: 20 (ref 12–28)
BUN: 17 mg/dL (ref 8–27)
Bilirubin Total: 0.4 mg/dL (ref 0.0–1.2)
CO2: 24 mmol/L (ref 20–29)
Calcium: 9.8 mg/dL (ref 8.7–10.3)
Chloride: 102 mmol/L (ref 96–106)
Creatinine, Ser: 0.86 mg/dL (ref 0.57–1.00)
Globulin, Total: 2.6 g/dL (ref 1.5–4.5)
Glucose: 88 mg/dL (ref 65–99)
Potassium: 4.7 mmol/L (ref 3.5–5.2)
Sodium: 138 mmol/L (ref 134–144)
Total Protein: 7 g/dL (ref 6.0–8.5)
eGFR: 74 mL/min/{1.73_m2} (ref >59)

## 2020-07-15 LAB — HEPATITIS C ANTIBODY: Hep C Virus Ab: 0.2 s/co ratio (ref 0.0–0.9)

## 2020-07-15 MED ORDER — ROSUVASTATIN CALCIUM 20 MG PO TABS: 20.0000 mg | ORAL_TABLET | Freq: Every day | ORAL | 3 refills | Status: DC

## 2020-07-15 NOTE — Assessment & Plan Note (Addendum)
LDL elevated to 116.  Goal LDL < 70  Not on statin. Add high intensity statin I doublt this problem is related to her current symptoms.

## 2020-07-15 NOTE — Assessment & Plan Note (Signed)
While she looks well proportioned, BMI=17 is quite worrisome.  Added mirtazipine in the hope it will help her sleep and increase her appetite.

## 2020-07-15 NOTE — Assessment & Plan Note (Signed)
Based on reassuring labs, the problem seems more due to complicated grief as opposed to an occult medical issue.

## 2020-07-15 NOTE — Assessment & Plan Note (Signed)
Add statin.

## 2020-07-15 NOTE — Assessment & Plan Note (Signed)
Given reassuring labs, I believe her weakness is mostly psychiatric in origin.  She agrees to counseling.  Prefers video counseling due to transportation issues.

## 2020-07-15 NOTE — Assessment & Plan Note (Signed)
And transportation issues.  Will do CCM referral

## 2020-07-15 NOTE — Progress Notes (Signed)
    SUBJECTIVE:   CHIEF COMPLAINT / HPI:   Grief and weight loss.  Husband, Antony Haste, died last fall.  She misses him terribly.  And the family has been unhelpful since his death.  (Unhelpful is the mildest term I can use.  In many ways they have been hurtful and added to her grief.  She feel abandoned by her family.    C/O weight loss, difficulty sleeping, leg cramps and lightheadedness on standing.   Additional contributing factors. Food insecurtity.   Transportation problems She cares for two people.  First is her handicapped sister who is "not much trouble." And mor recently, a 68 yo grandson recently released from inpatient psych with a diagnosis of schizophrenia.  Unrelated, she is on clopidegril.  It took some digging but she has significant aorto iliac atherosclerosis seen on abd CTA at [facility] 10/2014.  Not on a statin.  No recent cholesterol   OBJECTIVE:   BP 112/72   Pulse (!) 110   Ht 5\' 7"  (1.702 m)   Wt 112 lb 6.4 oz (51 kg)   SpO2 98%   BMI 17.60 kg/m   Thin with BMI 17 noted.   Lungs clear Cardiac RRR without m or g Abd benign Ext no edema Neuro.  Mentation, sensation grossly normal.  Affect: At times distraught in room.  Gate abnormal (apparently a longstanding problem made worse by weakness.)  ASSESSMENT/PLAN:   Hypercholesterolemia LDL elevated to 116.  Goal LDL < 70  Not on statin. Add high intensity statin I doublt this problem is related to her current symptoms.  Aortic mural thrombus (HCC) Add statin.  Complicated grief Given reassuring labs, I believe her weakness is mostly psychiatric in origin.  She agrees to counseling.  Prefers video counseling due to transportation issues.  Food insecurity And transportation issues.  Will do CCM referral  Malnutrition of moderate degree While she looks well proportioned, BMI=17 is quite worrisome.  Added mirtazipine in the hope it will help her sleep and increase her appetite.    Weakness Based on  reassuring labs, the problem seems more due to complicated grief as opposed to an occult medical issue.    Zenia Resides, MD [place]

## 2020-07-15 NOTE — Chronic Care Management (AMB) (Signed)
  Care Management  Consultation Note  07/15/2020 Name: Rachel Bryan MRN: [id number] DOB: [birth year]  Rachel Bryan is a 70 y.o. year old female who is a primary care patient of [name], [name], MD. The CCM team was consulted reference care coordination needs for Transportation Needs , Food Insecurity, and Mental Health Counseling and Resources.  Assessment: Patient was not interviewed or contacted during this encounter. CCM LCSW collaborated with PCP .    Intervention:Conducted brief assessment, recommendations and relevant information discussed.    Follow up Plan: Provider has been informed to place formal CCM referral.  LCSW will follow up with patient once initial appointment is scheduled by care guide.   Review of patient past medical history, allergies, medications, and health status, including review of pertinent consultant reports was performed as part of comprehensive evaluation and provision of care management/care coordination services.   Casimer Lanius, [place] / [place]   [phone] 2:57 PM

## 2020-07-16 ENCOUNTER — Telehealth: Payer: Self-pay | Admitting: *Deleted

## 2020-07-16 NOTE — Chronic Care Management (AMB) (Signed)
  Care Management   Note  07/16/2020 Name: NAZIRAH TRI MRN: [id number] DOB: [birth year]  JADENE STEMMER is a 70 y.o. year old female who is a primary care patient of [name], [name], MD. I reached out to [name] by phone today in response to a referral sent by Ms. Malachi Paradise Hotz's PCP, Zenia Resides, MD.    Ms. Hellstrom was given information about care management services today including:  Care management services include personalized support from designated clinical staff supervised by her physician, including individualized plan of care and coordination with other care providers 24/7 contact phone numbers for assistance for urgent and routine care needs. The patient may stop care management services at any time by phone call to the office staff.  Patient agreed to services and verbal consent obtained.   Follow up plan: Telephone appointment with care management team member scheduled for:07/20/2020  [facility]

## 2020-07-20 ENCOUNTER — Ambulatory Visit: Payer: Medicare Other | Admitting: Licensed Clinical Social Worker

## 2020-07-20 ENCOUNTER — Encounter: Payer: Self-pay | Admitting: Family Medicine

## 2020-07-20 ENCOUNTER — Encounter: Payer: Self-pay | Admitting: Licensed Clinical Social Worker

## 2020-07-20 DIAGNOSIS — F4321 Adjustment disorder with depressed mood: Secondary | ICD-10-CM

## 2020-07-20 DIAGNOSIS — Z5941 Food insecurity: Secondary | ICD-10-CM

## 2020-07-20 DIAGNOSIS — Z7189 Other specified counseling: Secondary | ICD-10-CM

## 2020-07-20 NOTE — Patient Instructions (Signed)
Licensed Clinical Social Worker Visit Information  Goals we discussed today:   Goals Addressed             This Visit's Progress    Begin and Stick with Counseling-Depression       Timeframe:  Short-Term Goal Priority:  High Start Date:  07/20/2020                           Expected End Date:                       Follow Up Date 08/03/2020   Continue with compliance of taking medication  I have placed a referral with [facility] to assist with connecting you with a mental health provider. they will contact you once a provider is located.  Why is this important?   Beating depression may take some time.  If you don't feel better right away, don't give up on your treatment plan.       Find Help in My Community       Timeframe:  Short-Term Goal Priority:  High Start Date:   07/20/2020                          Expected End Date:                       Follow Up Date 08/03/2020   Patient Goals/Self-Care Activities: Over the next 30 days Follow up with [employer] Kos( formerly Manufacturing engineer) for [employer] options  [facility] [place] Wednesday 5FF-6BW, Friday 11am-2pm, and Saturday 9am-12pm  [place]  [phone] Sat 9:00 to 10:00 I have places a referral  with [facility] for Vocational Rehabilitation to help your grandson find employment.  They will call you.   Why is this important?   Knowing how and where to find help for yourself or family in your neighborhood and community is an important skill.       Ms. Gagliardo was given information about Care Management services today including:  Care Management services include personalized support from designated clinical staff supervised by her physician, including individualized plan of care and coordination with other care providers 24/7 contact phone numbers for assistance for urgent and routine care needs. The patient may stop Care Management services at any time by phone call  to the office staff.   Patient agreed to services and verbal consent obtained.   Patient verbalizes understanding of instructions provided today and agrees to view in [place].   Follow up plan: SW will follow up with patient by phone over the next July 12th at  11:00  Casimer Lanius, [place]

## 2020-07-20 NOTE — Chronic Care Management (AMB) (Signed)
Care Management Clinical Social Work Note  07/20/2020 Name: Rachel Bryan MRN: [id number] DOB: 08-09-1950  Rachel Bryan is a 70 y.o. year old female who is a primary care patient of [name], [name], MD.  The Care Management team was consulted for assistance with chronic disease management and coordination needs.  Engaged with patient by telephone for initial visit in response to provider referral for social work chronic care management and care coordination services  Consent to Services:  Ms. Lesko was given information about Care Management services today including:  Care Management services includes personalized support from designated clinical staff supervised by her physician, including individualized plan of care and coordination with other care providers 24/7 contact phone numbers for assistance for urgent and routine care needs. The patient may stop case management services at any time by phone call to the office staff.  Patient agreed to services and consent obtained.   Assessment: Patient is currently experiencing symptoms of depression which seems to be exacerbated by psychosocial stressors from home and complicated grief.. See Care Plan below for interventions and patient self-care actives.  Recent life changes /stressors: 5 adults and 1 child in household only income is her check and son's wages from work. Stress of not being able to financially meet the needs of everyone in the home.  Recommendation: Patient may benefit from, and is in agreement to start counseling .   Follow up Plan: Patient would like continued follow-up.  CCM LCSW will follow up with patient 2 weeks. Patient will call office if needed prior to next encounter.   Review of patient past medical history, allergies, medications, and health status, including review of relevant consultants reports was performed today as part of a comprehensive evaluation and provision of chronic care management and care coordination  services.  SDOH (Social Determinants of Health) assessments and interventions performed:    Advanced Directives Status: See Vyn[employer] application for related entries.  Care Plan  Allergies  Allergen Reactions   Levaquin [Levofloxacin] Other (See Comments)    Per pt report.  Burning veins    Penicillins Hives and Palpitations    Has patient had a PCN reaction causing immediate rash, facial/tongue/throat swelling, SOB or lightheadedness with hypotension: yes Has patient had a PCN reaction causing severe rash involving mucus membranes or skin necrosis: yes Has patient had a PCN reaction that required hospitalization: no Has patient had a PCN reaction occurring within the last 10 years: no If all of the above answers are "NO", then may proceed with Cephalosporin use.    Codeine Hives and Other (See Comments)    sweating   Demerol [Meperidine] Hives   Morphine And Related Other (See Comments)    Hallucinations    Sulfa Antibiotics Hives    Outpatient Encounter Medications as of 07/20/2020  Medication Sig   Accu-Chek Softclix Lancets lancets Use as instructed   albuterol (PROVENTIL HFA;VENTOLIN HFA) 108 (90 Base) MCG/ACT inhaler Inhale 2 puffs into the lungs every 6 (six) hours as needed for wheezing or shortness of breath.   Alcohol Swabs (B-D SINGLE USE SWABS REGULAR) PADS USE AS DIRECTED EVERY DAY   Blood Glucose Monitoring Suppl DEVI 1 Units by Does not apply route daily.   citalopram (CELEXA) 20 MG tablet Take 2 tablets (40 mg total) by mouth daily.   clopidogrel (PLAVIX) 75 MG tablet TAKE 1 TABLET EVERY DAY   docusate sodium (COLACE) 100 MG capsule Take 1 capsule (100 mg total) by mouth 2 (two) times daily.  glucose blood (ACCU-CHEK GUIDE) test strip Use as instructed   Incontinence Supply Disposable (ATTENDS BREATHABLE BRIEFS MED) MISC 1 application by Does not apply route daily as needed. (Patient not taking: Reported on 11/06/2019)   minoxidil (MINOXIDIL FOR WOMEN) 2 % external  solution Apply topically 2 (two) times daily. (Patient not taking: No sig reported)   mirtazapine (REMERON) 15 MG tablet Take 1 tablet (15 mg total) by mouth at bedtime.   rosuvastatin (CRESTOR) 20 MG tablet Take 1 tablet (20 mg total) by mouth daily.   vitamin B-12 (CYANOCOBALAMIN) 1000 MCG tablet Take 1,000 mcg by mouth daily.   No facility-administered encounter medications on file as of 07/20/2020.    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   Memory difficulties 06/05/2014   Tremor 06/05/2014   Abnormality of gait 06/05/2014   Malnutrition of moderate degree ([place]) 24/58/0998   Complicated migraine 33/82/5053   Urinary incontinence 12/25/2012   Falls 09/13/2012   Depression 97/67/3419   Complicated grief 37/90/2409   Insomnia 01/12/2012    Conditions to be addressed/monitored: Depression; Limited social support and Limited access to food  Care Plan : General Social Work (Adult)  Updates made by Maurine Cane, LCSW since 07/20/2020 12:00 AM   Problem: Emotional Distress ( depression & complicated grief)    Goal: Emotional Health Supported with counseling   Start Date: 07/20/2020  This Visit's Progress: On track  Priority: High  Current barriers:   Chronic Mental Health needs related to symptoms of depression and complicated grief Limited social support Needs Support, Education, and Care Coordination in order to meet unmet mental health needs. Clinical Goal(s): patient will follow up with [facility]/ counseling referral placed by LCSW  Clinical Interventions:  Assessed patient's previous and current treatment, coping skills, support system and barriers to care  interventions provided: Solution-Focused Strategies, Active listening  / Reflection utilized , Emotional Supportive Provided, Participation in counseling encouraged , and Discussed referral to [facility] to assist with connecting to mental health provider ; Discussed several options for long term counseling based on need and insurance. Patient in agreement for referral to [employer] with PCP regarding development and update of comprehensive plan of care as evidenced by provider attestation and co-signature Inter-disciplinary care team collaboration (see longitudinal plan of care) Patient Goals/Self-Care Activities: Over the next 30 days Continue with compliance of taking medication  I have placed a referral with [facility] to assist with connecting you with a mental health provider. they will contact you once a provider is located.      Problem: SDOH (food insecurity)/financial stress    Goal: Connect to community resources to reduce stress at home   Start Date: 07/20/2020  This Visit's Progress: On track  Priority: High  Current barriers:    Financial constraints related to income inhousehold, Limited social support, and Limited access to food Clinical Goals: Patient will work with patient to address needs related to stress at home Clinical Interventions:  Collaboration with Andria Frames, [name], MD regarding development and update of comprehensive plan of care as evidenced by provider attestation and co-signature Inter-disciplinary care team collaboration (see longitudinal plan of care) Assessment of needs, barriers , agencies contacted, as well as how impacting  Review various resources, discussed options and provided patient  information about Department of Social Services (food stamps, [employer], and utilities assistance), [employer] options , Enhanced Benefits connected with insurance provider ([employer] Medicare and Medicaid), Vocational Rehabilitation for grandson and Referral for [employer] 360 Solution-Focused Strategies, Active listening / Reflection  utilized , Emotional Supportive Provided, and Problem [place]  Patient Goals/Self-Care Activities: Over the next 30 days Follow up with [employer] Church( formerly Architectural technologist) for [employer] options  Definition [place] Every Friday [id number]-[id number]: Wednesday 3pm-7pm, Friday 11am-2pm, and Saturday 9am-12pm  [facility] [place] [place]  [phone] Sat 9:00 to 10:00 I have places a referral  with [facility] for Vocational Rehabilitation to help your grandson find employment.  They will call you.       Casimer Lanius, [place] / [place]   [phone] 3:05 PM

## 2020-07-21 ENCOUNTER — Ambulatory Visit: Payer: Medicare Other | Admitting: Licensed Clinical Social Worker

## 2020-07-21 DIAGNOSIS — Z789 Other specified health status: Secondary | ICD-10-CM

## 2020-07-21 DIAGNOSIS — Z7189 Other specified counseling: Secondary | ICD-10-CM

## 2020-07-21 NOTE — Patient Instructions (Signed)
Visit Information   Goals Addressed             This Visit's Progress    Quality of life improved       Patient Goals/Self-Care Activities:  I will coordinate with your doctor to assist with Incontinence supplies  [facility] should contact you in 2 to 3 days Call office if no one has called you in 1 weeks       Patient verbalizes understanding of instructions provided today and agrees to view in [place].   I will call you in 2 weeks  Casimer Lanius, [place]

## 2020-07-21 NOTE — Chronic Care Management (AMB) (Signed)
  Care Management  Collaboration  Note  07/21/2020 Name: Rachel Bryan MRN: [id number] DOB: 08/22/1950  Rachel Bryan is a 70 y.o. year old female who is a primary care patient of [name], [name], MD. The CCM team was consulted reference care coordination needs for Incontinence supplies   Assessment: Patient was not interviewed or contacted during this encounter. CCM LCSW collaborated with [facility] .  See Care Plan below for interventions and patient self-care actives.   Intervention:Conducted brief assessment, recommendations and relevant information faxed to [facility].    Follow up Plan: LCSW will f/u with patient in 2 weeks.   Collaboration with Zenia Resides, MD regarding development and update of comprehensive plan of care as evidenced by provider attestation and co-signature  Review of patient past medical history, allergies, medications, and health status, including review of pertinent consultant reports was performed as part of comprehensive evaluation and provision of care management/care coordination services.   Care Plan Conditions to be addressed/monitored per PCP order:  Incontinence    Patient Care Plan: General Social Work (Adult)   Problem Identified: Quality of Life    Goal: Quality of Life Maintained with Incontinence supplies   Start Date: 07/21/2020  This Visit's Progress: On track  Priority: High  Current barriers:   Care Coordination needed to assist with DME/Supplies  Currently unable to  independently self navigate needs related to chronic health conditions.  Clinical Goals:   Over the next 30 days, patient will work with  [facility] to address unmet needs related to DME for Incontinence supplies  Interventions: Assessed patient needs, how currently meeting need and barriers to care Patient expressed concern with urinary incontinence and requesting assistance with the following  chucks gloves wipes pull ups Review  and discussed options. provided patient  information about Enhanced Benefits connected with insurance provider (Medicaid) Collaboration with/confirmation of receipt of DME orders at [employer] with primary care provider and [facility] RN team  Clinical interventions provided: Solution-Focused Strategies and Emotional Supportive Provided  collaboration with PCP regarding development and update of comprehensive plan of care as evidenced by provider attestation and co-signature Inter-disciplinary care team collaboration (see longitudinal plan of care) Patient Goals/Self-Care Activities: Over the next 30 days I will coordinate with your doctor to assist with Incontinence supplies  [facility] should contact you  Call office if no one has called you in 1 weeks      Casimer Lanius, [place] / [place]   [phone] 8:49 AM

## 2020-07-29 ENCOUNTER — Encounter: Payer: Self-pay | Admitting: Family Medicine

## 2020-08-03 ENCOUNTER — Telehealth: Payer: Self-pay | Admitting: Licensed Clinical Social Worker

## 2020-08-03 ENCOUNTER — Encounter: Payer: Self-pay | Admitting: Family Medicine

## 2020-08-03 ENCOUNTER — Telehealth: Payer: Medicare Other

## 2020-08-03 NOTE — Chronic Care Management (AMB) (Signed)
    Clinical Social Work  Care Management   Phone Outreach    08/03/2020 Name: Rachel Bryan MRN: [id number] DOB: 18-Aug-1950  Rachel Bryan is a 70 y.o. year old female who is a primary care patient of [name], [name], MD .   F/U phone call today to assess needs, progress and barriers with care plan goals.   Telephone outreach was unsuccessful A HIPPA compliant phone message was left for the patient providing contact information and requesting a return call.   Plan:CCM LCSW will wait for return call. If no return call is received, Will route chart to Care Guide to see if patient would like to reschedule phone appointment   Review of patient status, including review of consultants reports, relevant laboratory and other test results, and collaboration with appropriate care team members and the patient's provider was performed as part of comprehensive patient evaluation and provision of care management services.     Casimer Lanius, [place] / [place]   [phone] 11:17 AM

## 2020-08-05 ENCOUNTER — Encounter: Payer: Self-pay | Admitting: Family Medicine

## 2020-08-18 ENCOUNTER — Ambulatory Visit: Payer: Medicare Other | Admitting: Family Medicine

## 2020-08-19 ENCOUNTER — Ambulatory Visit: Payer: Medicare Other | Admitting: Licensed Clinical Social Worker

## 2020-08-19 DIAGNOSIS — Z789 Other specified health status: Secondary | ICD-10-CM

## 2020-08-19 NOTE — Patient Instructions (Signed)
  Per your request your phone appointment with me has been rescheduled for 08/23/2020 at 2:00  Casimer Lanius, [facility]  [phone]

## 2020-08-19 NOTE — Chronic Care Management (AMB) (Signed)
    Clinical Social Work  Care Management   Phone Outreach    08/19/2020 Name: Rachel Bryan MRN: [id number] DOB: 1950-11-01  Rachel Bryan is a 70 y.o. year old female who is a primary care patient of [name], [name], MD .   F/U phone call today to assess needs, progress and barriers with care plan goals.  Patient reports being stressed and has a lot going on.She is unable to keep phone appointment today and requested to reschedule.  Plan:Appointment was rescheduled with CCM LCSW 08/23/2020  Review of patient status, including review of consultants reports, relevant laboratory and other test results, and collaboration with appropriate care team members and the patient's provider was performed as part of comprehensive patient evaluation and provision of care management services.     Casimer Lanius, [place] / [place]   [phone] 11:43 AM

## 2020-08-23 ENCOUNTER — Telehealth: Payer: Medicare Other

## 2020-08-24 ENCOUNTER — Telehealth: Payer: Self-pay | Admitting: Licensed Clinical Social Worker

## 2020-08-24 NOTE — Chronic Care Management (AMB) (Signed)
    Clinical Social Work  Care Management   Phone Outreach    08/24/2020 Name: SHWETA WIPF MRN: [id number] DOB: 1950-02-04  DARSHANA BROOMFIELD is a 70 y.o. year old female who is a primary care patient of [name], [name], MD .   F/U phone call today to assess needs, progress and barriers with care plan goals.   Telephone outreach was unsuccessful A HIPPA compliant phone message was left for the patient providing contact information and requesting a return call.   Plan:CCM LCSW will wait for return call.   Review of patient status, including review of consultants reports, relevant laboratory and other test results, and collaboration with appropriate care team members and the patient's provider was performed as part of comprehensive patient evaluation and provision of care management services.     Casimer Lanius, [place] / [place]   [phone] 10:00 AM

## 2020-08-27 ENCOUNTER — Other Ambulatory Visit: Payer: Self-pay | Admitting: *Deleted

## 2020-08-27 DIAGNOSIS — F4321 Adjustment disorder with depressed mood: Secondary | ICD-10-CM

## 2020-08-27 DIAGNOSIS — E7800 Pure hypercholesterolemia, unspecified: Secondary | ICD-10-CM

## 2020-08-27 DIAGNOSIS — Z13220 Encounter for screening for lipoid disorders: Secondary | ICD-10-CM

## 2020-08-30 ENCOUNTER — Ambulatory Visit: Payer: Medicare Other | Admitting: Family Medicine

## 2020-08-30 MED ORDER — CLOPIDOGREL BISULFATE 75 MG PO TABS: 75.0000 mg | ORAL_TABLET | Freq: Every day | ORAL | 3 refills | Status: DC

## 2020-08-30 MED ORDER — ROSUVASTATIN CALCIUM 20 MG PO TABS: 20.0000 mg | ORAL_TABLET | Freq: Every day | ORAL | 3 refills | Status: DC

## 2020-08-30 MED ORDER — MIRTAZAPINE 15 MG PO TABS: 15.0000 mg | ORAL_TABLET | Freq: Every day | ORAL | 6 refills | Status: DC

## 2020-09-15 ENCOUNTER — Ambulatory Visit: Payer: Medicare Other | Admitting: Family Medicine

## 2020-09-20 ENCOUNTER — Ambulatory Visit: Payer: Medicare Other | Admitting: Licensed Clinical Social Worker

## 2020-09-20 DIAGNOSIS — Z7189 Other specified counseling: Secondary | ICD-10-CM

## 2020-09-20 NOTE — Patient Instructions (Signed)
Visit Information   Goals Addressed             This Visit's Progress    Find Help in My Community       Timeframe:  Short-Term Goal Priority:  High Start Date:   07/20/2020                          Expected End Date:                       I have placed a referral  with [facility] for meal on wheels. They will call you.     Quality of life improved   On track    Patient Goals/Self-Care Activities: Over the next 30 days  I have contacted [facility] about getting your wipes        It was a pleasure speaking with you today. Please call the office if needed No follow up scheduled, per our conversation I will contact you in 3 weeks.  Patient verbalizes understanding of instructions provided today.   Casimer Lanius, LCSW Care Management & Coordination  [phone]

## 2020-09-20 NOTE — Chronic Care Management (AMB) (Signed)
Care Management Clinical Social Work Note  09/20/2020 Name: Rachel Bryan MRN: [id number] DOB: 07-Mar-1950  Rachel Bryan is a 70 y.o. year old female who is a primary care patient of [name], [name], MD.  The Care Management team was consulted for assistance with chronic disease management and coordination needs.  Consent to Services:  The patient was given information about Care Management services, agreed to services, and gave verbal consent prior to initiation of services.  Please see initial visit note for detailed documentation.   Patient agreed to services today and consent obtained.   Assessment: Engaged with Patient by phone in response to provider referral for social work care coordination services: [employer] , Level of Care Concerns, and Mental Health Counseling and Resources.   She continues to experience difficulty with managing stress however has declined ongoing counseling referral.. Also reports concerns with memory, and hands shaking. Has reservations about seeking medical attention and will wait until appointment with PCP. See Care Plan below for interventions and patient self-care actives.  Recent life changes or stressors: decline in health and concerns with health. ( Sent detailed in-basket message to PCP)  Recommendation: Patient may benefit from, and is in agreement to keep appointment with PCP, will call office is change in condition, and in agreement for LCSW to make meals on wheels referral ( however they have a long wait list) .   Follow up Plan: Patient would like continued follow-up from CCM LCSW .  per patient's request will follow up in 3 weeks.  Will call office if needed prior to next encounter.    Review of patient past medical history, allergies, medications, and health status, including review of relevant consultants reports was performed today as part of a comprehensive evaluation and provision of chronic care management and care coordination  services.  SDOH (Social Determinants of Health) assessments and interventions performed:    Advanced Directives Status: See Vynca application for related entries.  Care Plan  Allergies  Allergen Reactions   Levaquin [Levofloxacin] Other (See Comments)    Per pt report.  Burning veins    Penicillins Hives and Palpitations    Has patient had a PCN reaction causing immediate rash, facial/tongue/throat swelling, SOB or lightheadedness with hypotension: yes Has patient had a PCN reaction causing severe rash involving mucus membranes or skin necrosis: yes Has patient had a PCN reaction that required hospitalization: no Has patient had a PCN reaction occurring within the last 10 years: no If all of the above answers are "NO", then may proceed with Cephalosporin use.    Codeine Hives and Other (See Comments)    sweating   Demerol [Meperidine] Hives   Morphine And Related Other (See Comments)    Hallucinations    Sulfa Antibiotics Hives    Outpatient Encounter Medications as of 09/20/2020  Medication Sig   Accu-Chek Softclix Lancets lancets Use as instructed   albuterol (PROVENTIL HFA;VENTOLIN HFA) 108 (90 Base) MCG/ACT inhaler Inhale 2 puffs into the lungs every 6 (six) hours as needed for wheezing or shortness of breath.   Alcohol Swabs (B-D SINGLE USE SWABS REGULAR) PADS USE AS DIRECTED EVERY DAY   Blood Glucose Monitoring Suppl DEVI 1 Units by Does not apply route daily.   citalopram (CELEXA) 20 MG tablet Take 2 tablets (40 mg total) by mouth daily.   clopidogrel (PLAVIX) 75 MG tablet Take 1 tablet (75 mg total) by mouth daily.   docusate sodium (COLACE) 100 MG capsule Take 1 capsule (100  mg total) by mouth 2 (two) times daily.   glucose blood (ACCU-CHEK GUIDE) test strip Use as instructed   Incontinence Supply Disposable (ATTENDS BREATHABLE BRIEFS MED) MISC 1 application by Does not apply route daily as needed. (Patient not taking: Reported on 11/06/2019)   minoxidil (MINOXIDIL FOR  WOMEN) 2 % external solution Apply topically 2 (two) times daily. (Patient not taking: No sig reported)   mirtazapine (REMERON) 15 MG tablet Take 1 tablet (15 mg total) by mouth at bedtime.   rosuvastatin (CRESTOR) 20 MG tablet Take 1 tablet (20 mg total) by mouth daily.   vitamin B-12 (CYANOCOBALAMIN) 1000 MCG tablet Take 1,000 mcg by mouth daily.   No facility-administered encounter medications on file as of 09/20/2020.    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   Memory difficulties 06/05/2014   Tremor 06/05/2014   Abnormality of gait 06/05/2014   Malnutrition of moderate degree ([place]) XX123456   Complicated migraine [id number]   Urinary incontinence 12/25/2012   Falls 09/13/2012   Depression [id number]   Complicated grief [id number]   Insomnia 01/12/2012    Conditions to be addressed/monitored: ; Limited social support, Level of care concerns, and stress  Care Plan : General Social Work (Adult)  Updates made by Maurine Cane, LCSW since 09/20/2020 12:00 AM     Problem: Emotional Distress ( depression & complicated grief)      Goal: Emotional Health Supported with counseling   Start Date: 07/20/2020  This Visit's Progress: On track  Recent Progress: On track  Priority: High  Note:   Current barriers:   Chronic Mental Health needs related to symptoms of depression and complicated grief Limited social support; patient has declined going to counseling  Needs Support, Education, and Care Coordination in order to meet unmet mental health needs. Clinical Goal(s): patient will work with LCSW to assist with reducing symptoms of stress Clinical Interventions:  Assessed patient's previous and current treatment, coping skills,  support system and barriers to care   Solution-Focused Strategies, Active listening / Reflection utilized , Emotional Supportive Provided, Behavioral Activation, [place] , Motivational Interviewing, Reviewed mental health medications with patient and discussed compliance: , and Caregiver stress acknowledged  ; Inter-disciplinary care team collaboration (see longitudinal plan of care) Patient Goals/Self-Care Activities: Over the next 30 days Continue with compliance of taking medication     Problem: SDOH (food insecurity)/financial stress      Goal: Connect to community resources to reduce stress at home   Start Date: 07/20/2020  This Visit's Progress: On track  Recent Progress: On track  Priority: High  Note:   Current barriers:    Financial constraints related to income inhousehold, Limited social support, and Limited access to food Has food stamps but feeding household on her benefits Clinical Goals: Patient will work with patient to address needs related to stress at home Clinical Interventions:  Inter-disciplinary care team collaboration (see longitudinal plan of care) Assessment of needs, barriers , agencies contacted, as well as how impacting  Review various resources, discussed options and provided patient information about Department of Social Services (food stamps, [employer], and utilities assistance), [employer] options , Enhanced Benefits connected with insurance provider ([employer] Medicare and Medicaid), Vocational Rehabilitation for grandson and Referral for [employer]  360 Solution-Focused Strategies, Active listening / Reflection utilized , Emotional Supportive Provided, and Problem [place]  Patient Goals/Self-Care Activities: Over the next 30 days I have placed a referral  with [facility] for meal on wheels. They will call you.    Problem: Quality of Life      Goal: Quality of Life Maintained with Incontinence supplies   Start Date: 07/21/2020   This Visit's Progress: On track  Recent Progress: On track  Priority: High  Note:   Current barriers:   Care Coordination needed to assist with DME/Supplies  Currently unable to  independently self navigate needs related to chronic health conditions.  Clinical Goals:   Over the next 30 days, patient will work with  [facility] to address unmet needs related to DME for Incontinence supplies  Interventions: Assessed patient needs, progress and barriers to care Patient expressed concern with urinary incontinence and requesting assistance with the following  chucks gloves wipes pull ups Collaboration with/confirmation of receipt of DME orders at [facility] Clinical interventions provided: Solution-Focused Strategies and Emotional Supportive Provided  collaboration with PCP regarding development and update of comprehensive plan of care as evidenced by provider attestation and co-signature Inter-disciplinary care team collaboration (see longitudinal plan of care) Patient Goals/Self-Care Activities: Over the next 30 days I have contacted [facility] about getting your wipes         Casimer Lanius, [place] / [place]   [phone] 4:24 PM

## 2020-09-23 ENCOUNTER — Telehealth: Payer: Self-pay

## 2020-09-23 ENCOUNTER — Ambulatory Visit: Payer: Medicare Other | Admitting: Licensed Clinical Social Worker

## 2020-09-23 DIAGNOSIS — R42 Dizziness and giddiness: Secondary | ICD-10-CM

## 2020-09-23 DIAGNOSIS — Z719 Counseling, unspecified: Secondary | ICD-10-CM

## 2020-09-23 MED ORDER — MECLIZINE HCL 12.5 MG PO TABS: 12.5000 mg | ORAL_TABLET | Freq: Two times a day (BID) | ORAL | 0 refills | Status: DC

## 2020-09-23 NOTE — Chronic Care Management (AMB) (Signed)
Care Management Clinical Social Work Note  09/23/2020 Name: Rachel Bryan MRN: [id number] DOB: 28-Jul-1950  Rachel Bryan is a 70 y.o. year old female who is a primary care patient of [name], Rachel Collin, MD.  The Care Management team was consulted for assistance with chronic disease management and coordination needs.  Consent to Services:  The patient was given information about Care Management services, agreed to services, and gave verbal consent prior to initiation of services.  Please see initial visit note for detailed documentation.   Patient agreed to services today and consent obtained.   Assessment: Engaged with patient by phone in response to provider referral for social work care coordination services:    Patient continues to experience difficulty with managing her stress related to health concerns and family. Discussed options with patient to get support for disabled sister. She will talk with daughter Rachel Bryan to assist. [place] below for interventions and patient self-care actives.  Recent life changes or stressors: continues to stagger and having difficulty walking. Has appointment with PCP next week  Recommendation: Patient may benefit from, and is in agreement to allow LCSW to connect her with RN for brief assessment. Message sent to [facility] to contact patient.   Follow up Plan: Patient would like continued follow-up from [facility]CM LCSW .  per patient's request will follow up in 2 weeks.  Will call office if needed prior to next encounter.    : Review of patient past medical history, allergies, medications, and health status, including review of relevant consultants reports was performed today as part of a comprehensive evaluation and provision of chronic care management and care coordination services.  SDOH (Social Determinants of Health) assessments and interventions performed:    Advanced Directives Status: See Vynca application for related entries.  Care Plan  Allergies   Allergen Reactions   Levaquin [Levofloxacin] Other (See Comments)    Per pt report.  Burning veins    Penicillins Hives and Palpitations    Has patient had a PCN reaction causing immediate rash, facial/tongue/throat swelling, SOB or lightheadedness with hypotension: yes Has patient had a PCN reaction causing severe rash involving mucus membranes or skin necrosis: yes Has patient had a PCN reaction that required hospitalization: no Has patient had a PCN reaction occurring within the last 10 years: no If all of the above answers are "NO", then may proceed with Cephalosporin use.    Codeine Hives and Other (See Comments)    sweating   Demerol [Meperidine] Hives   Morphine And Related Other (See Comments)    Hallucinations    Sulfa Antibiotics Hives    Outpatient Encounter Medications as of 09/23/2020  Medication Sig   Accu-Chek Softclix Lancets lancets Use as instructed   albuterol (PROVENTIL HFA;VENTOLIN HFA) 108 (90 Base) MCG/ACT inhaler Inhale 2 puffs into the lungs every 6 (six) hours as needed for wheezing or shortness of breath.   Alcohol Swabs (B-D SINGLE USE SWABS REGULAR) PADS USE AS DIRECTED EVERY DAY   Blood Glucose Monitoring Suppl DEVI 1 Units by Does not apply route daily.   citalopram (CELEXA) 20 MG tablet Take 2 tablets (40 mg total) by mouth daily.   clopidogrel (PLAVIX) 75 MG tablet Take 1 tablet (75 mg total) by mouth daily.   docusate sodium (COLACE) 100 MG capsule Take 1 capsule (100 mg total) by mouth 2 (two) times daily.   glucose blood (ACCU-CHEK GUIDE) test strip Use as instructed   Incontinence Supply Disposable (ATTENDS BREATHABLE BRIEFS  MED) MISC 1 application by Does not apply route daily as needed. (Patient not taking: Reported on 11/06/2019)   minoxidil (MINOXIDIL FOR WOMEN) 2 % external solution Apply topically 2 (two) times daily. (Patient not taking: No sig reported)   mirtazapine (REMERON) 15 MG tablet Take 1 tablet (15 mg total) by mouth at bedtime.    rosuvastatin (CRESTOR) 20 MG tablet Take 1 tablet (20 mg total) by mouth daily.   vitamin B-12 (CYANOCOBALAMIN) 1000 MCG tablet Take 1,000 mcg by mouth daily.   No facility-administered encounter medications on file as of 09/23/2020.    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   Memory difficulties 06/05/2014   Tremor 06/05/2014   Abnormality of gait 06/05/2014   Malnutrition of moderate degree ([place]) XX123456   Complicated migraine XX123456   Urinary incontinence 12/25/2012   Falls 09/13/2012   Depression [id number]   Complicated grief [id number]   Insomnia 01/12/2012    Conditions to be addressed/monitored: ; Level of care concerns  Care Plan : General Social Work (Adult)  Updates made by Maurine Cane, LCSW since 09/23/2020 12:00 AM     Problem: Emotional Distress ( depression & complicated grief)      Goal: Emotional Health Supported with counseling   Start Date: 07/20/2020  This Visit's Progress: On track  Recent Progress: On track  Priority: High  Note:   Current barriers:   Chronic Mental Health needs related to symptoms of depression and complicated grief Limited social support; patient has declined going to counseling  Needs Support, Education, and Care Coordination in order to meet unmet mental health needs. Clinical Goal(s): patient will work with LCSW to assist with reducing symptoms of stress Clinical Interventions:  Assessed patient's previous and current treatment, coping skills, support system and barriers to care   Solution-Focused Strategies, Active listening / Reflection utilized , Emotional Supportive Provided, Behavioral Activation, [place] , Motivational Interviewing, Reviewed mental  health medications with patient and discussed compliance: , and Caregiver stress acknowledged  ; Inter-disciplinary care team collaboration (see longitudinal plan of care) Patient Goals/Self-Care Activities: Over the next 30 days Continue with compliance of taking medication       Casimer Lanius, [place] / [place]   [phone] 12:47 PM

## 2020-09-23 NOTE — Telephone Encounter (Signed)
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  Will try low dose meclizine.  She will keep appointment with me next week.

## 2020-09-23 NOTE — Patient Instructions (Signed)
Visit Information   Goals Addressed             This Visit's Progress    Find Help in My Community   On track    Timeframe:  Short-Term Goal Priority:  High Start Date:   07/20/2020                          Expected End Date:                       I have placed a referral  with [facility] for meal on wheels. They will call you.       It was a pleasure speaking with you today. Please call the office if needed No follow up scheduled, per our conversation I will contact you in 2 weeks.  Patient verbalizes understanding of instructions provided today.   Casimer Lanius, LCSW Care Management & Coordination  [phone]

## 2020-09-23 NOTE — Telephone Encounter (Signed)
Received message from LCSW, Neoma Laming regarding patient.   Reports that she has been "staggering for a while now" and that she has also been swaying where she almost falls. Reports that these symptoms have been "occurring for awhile now."   Denies facial drooping, arm weakness, speech difficulties, headache. Does report that she has also had mild confusion for several months.   Offered to schedule patient for appointment tomorrow. Patient declined stating that she only wants to see Dr. Andria Frames. Patient is scheduled for next available with PCP on 9/7.  Provided with strict ED precautions.   Talbot Grumbling, RN

## 2020-09-28 ENCOUNTER — Telehealth: Payer: Self-pay | Admitting: Family Medicine

## 2020-09-28 NOTE — Telephone Encounter (Signed)
**Note Rachel-Identified via Obfuscation**    Rachel Bryan DOB: Jan 04, 1951 MRN: [id number]   RIDER WAIVER AND RELEASE OF LIABILITY  For purposes of improving physical access to our facilities, Rachel Bryan is pleased to partner with third parties to provide Rachel Bryan patients or other authorized individuals the option of convenient, on-demand ground transportation services (the [name]") through use of the technology service that enables users to request on-demand ground transportation from independent third-party providers.  By opting to use and accept these Rachel Bryan, I, the undersigned, hereby agree on behalf of myself, and on behalf of any minor child using the Rachel Bryan for whom I am the parent or legal guardian, as follows:  Rachel Bryan provided to me are provided by independent third-party transportation providers who are not [name] or employees and who are unaffiliated with [name]. Rachel Bryan is neither a transportation carrier nor a common or public carrier. Rachel Bryan has no control over the quality or safety of the transportation that occurs as a result of the Rachel Bryan. Rachel Bryan cannot guarantee that any third-party transportation provider will complete any arranged transportation service. Rachel Bryan makes no representation, warranty, or guarantee regarding the reliability, timeliness, quality, safety, suitability, or availability of any of the Transport Services or that they will be error free. I fully understand that traveling by vehicle involves risks and dangers of serious bodily injury, including permanent disability, paralysis, and death. I agree, on behalf of myself and on behalf of any minor child using the Transport Services for whom I am the parent or legal guardian, that the entire risk arising out of my use of the Rachel Bryan remains solely with me, to the maximum extent permitted under applicable law. The Rachel Bryan are provided "as is"  and "as available." Rachel Bryan disclaims all representations and warranties, express, implied or statutory, not expressly set out in these terms, including the implied warranties of merchantability and fitness for a particular purpose. I hereby waive and release Rachel Bryan, its agents, employees, officers, directors, representatives, insurers, attorneys, assigns, successors, subsidiaries, and affiliates from any and all past, present, or future claims, demands, liabilities, actions, causes of action, or suits of any kind directly or indirectly arising from acceptance and use of the Rachel Bryan. I further waive and release  and its affiliates from all present and future liability and responsibility for any injury or death to persons or damages to property caused by or related to the use of the Rachel Bryan. I have read this Waiver and Release of Liability, and I understand the terms used in it and their legal significance. This Waiver is freely and voluntarily given with the understanding that my right (as well as the right of any minor child for whom I am the parent or legal guardian using the Rachel Bryan) to legal recourse against  in connection with the Rachel Bryan is knowingly surrendered in return for use of these services.   I attest that I read the consent document to Rachel Bryan, gave Rachel Bryan the opportunity to ask questions and answered the questions asked (if any). I affirm that Rachel Bryan then provided consent for she's participation in this program.     Rachel Bryan

## 2020-09-29 ENCOUNTER — Ambulatory Visit: Payer: Medicare Other | Admitting: Family Medicine

## 2020-09-30 ENCOUNTER — Other Ambulatory Visit: Payer: Self-pay | Admitting: Family Medicine

## 2020-09-30 DIAGNOSIS — Z1231 Encounter for screening mammogram for malignant neoplasm of breast: Secondary | ICD-10-CM

## 2020-10-14 ENCOUNTER — Ambulatory Visit: Payer: Medicare Other | Admitting: Family Medicine

## 2020-10-14 DIAGNOSIS — Z1211 Encounter for screening for malignant neoplasm of colon: Secondary | ICD-10-CM | POA: Diagnosis not present

## 2020-10-21 ENCOUNTER — Telehealth: Payer: Self-pay | Admitting: *Deleted

## 2020-10-21 NOTE — Chronic Care Management (AMB) (Signed)
  Care Management   Note  10/21/2020 Name: Rachel Bryan MRN: [id number] DOB: 04/28/1950  [name] is a 70 y.o. year old female who is a primary care patient of [name], [name], MD and is actively engaged with the care management team. I reached out to [name] by phone today to assist with re-scheduling a follow up visit with the Licensed Clinical Social Worker  Follow up plan: Patient declines further follow up and engagement by the care management team. Appropriate care team members and provider have been notified via electronic communication. The care management team is available to follow up with the patient after provider conversation with the patient regarding recommendation for care management engagement and subsequent re-referral to the care management team.   [facility]  Direct Dial: [phone]

## 2020-10-21 NOTE — Telephone Encounter (Signed)
Noted  

## 2020-10-27 ENCOUNTER — Ambulatory Visit: Payer: Medicare Other | Admitting: Family Medicine

## 2020-11-03 ENCOUNTER — Ambulatory Visit (INDEPENDENT_AMBULATORY_CARE_PROVIDER_SITE_OTHER): Payer: Medicare Other

## 2020-11-03 ENCOUNTER — Other Ambulatory Visit: Payer: Self-pay

## 2020-11-03 ENCOUNTER — Ambulatory Visit: Payer: Medicare Other

## 2020-11-03 DIAGNOSIS — Z0000 Encounter for general adult medical examination without abnormal findings: Secondary | ICD-10-CM

## 2020-11-03 NOTE — Progress Notes (Signed)
Subjective:   Rachel Bryan is a 70 y.o. female who presents for Medicare Annual (Subsequent) preventive examination.  Patient consented to have virtual visit and was identified by name and date of birth. Method of visit: Telephone  Encounter participants: Patient: Rachel Bryan - located at Home Nurse/Provider: Dorna Bloom - located at [facility] Others (if applicable): NA  Review of Systems: Defer to PCP.  Cardiac Risk Factors include: diabetes mellitus;smoking/ tobacco exposure  Objective:   Vitals: There were no vitals taken for this visit.  There is no height or weight on file to calculate BMI.  Advanced Directives 11/03/2020 07/14/2020 11/06/2019 05/21/2017 03/28/2016 08/05/2015 09/08/2014  Does Patient Have a Medical Advance Directive? No No No No No No No  Type of Advance Directive - - - - - - -  Does patient want to make changes to medical advance directive? - - - - - - -  Copy of Aristes in Chart? - - - - - - -  Would patient like information on creating a medical advance directive? Yes (MAU/Ambulatory/Procedural Areas - Information given) No - Patient declined No - Patient declined No - Patient declined No - Patient declined No - patient declined information No - patient declined information   Tobacco Social History   Tobacco Use  Smoking Status Every Day   Packs/day: 0.50   Types: Cigarettes  Smokeless Tobacco Never  Tobacco Comments   Patient is down to .5PPD from 2PPD.     Ready to quit: No Counseling given: Yes Tobacco comments: Patient is down to .5PPD from 2PPD.  Clinical Intake:  Pre-visit preparation completed: Yes  How often do you need to have someone help you when you read instructions, pamphlets, or other written materials from your doctor or pharmacy?: 3 - Sometimes What is the last grade level you completed in school?: 8th grade  Interpreter Needed?: No  Past Medical History:  Diagnosis Date   Abnormality of gait 06/05/2014    Anxiety    Cluster headache    Incontinence of urine    Insomnia secondary to anxiety    Lactose intolerance    Memory difficulties 06/05/2014   Past Surgical History:  Procedure Laterality Date   ABDOMINAL HYSTERECTOMY     APPENDECTOMY     Family History  Problem Relation Age of Onset   Transient ischemic attack Mother    Cancer Mother        lung (smoker)   Anxiety disorder Mother    Cancer Father        prostate   Hypertension Father    Mental retardation Sister    Healthy Brother    Healthy Brother    Cancer Maternal Grandmother        leukemia   Social History   Socioeconomic History   Marital status: Divorced    Spouse name: Not on file   Number of children: 4   Years of education: 8   Highest education level: 8th grade  Occupational History   Occupation: unemployed  Tobacco Use   Smoking status: Every Day    Packs/day: 0.50    Types: Cigarettes   Smokeless tobacco: Never   Tobacco comments:    Patient is down to .5PPD from 2PPD.  Vaping Use   Vaping Use: Never used  Substance and Sexual Activity   Alcohol use: No    Alcohol/week: 0.0 standard drinks   Drug use: No   Sexual activity: Not Currently  Birth control/protection: Surgical  Other Topics Concern   Not on file  Social History Narrative   Patient lives with her sister and grandson.   Patient is currently taking care of her sister and her grandson relies heavily on her.    Patients fiance passed away last 02-08-23.     Patient does not own a person vehicle.    Patient enjoys spending time with her family and taking care of her animals.    Patient enjoys watching TV and cooking when she can.       Social Determinants of Health   Financial Resource Strain: High Risk   Difficulty of Paying Living Expenses: Hard  Food Insecurity: Food Insecurity Present   Worried About Charity fundraiser in the Last Year: Sometimes true   Arboriculturist in the Last Year: Sometimes true  Transportation  Needs: Public librarian (Medical): Yes   Lack of Transportation (Non-Medical): Yes  Physical Activity: Inactive   Days of Exercise per Week: 0 days   Minutes of Exercise per Session: 0 min  Stress: Stress Concern Present   Feeling of Stress : Very much  Social Connections: Socially Isolated   Frequency of Communication with Friends and Family: More than three times a week   Frequency of Social Gatherings with Friends and Family: More than three times a week   Attends Religious Services: Never   Marine scientist or Organizations: No   Attends Archivist Meetings: Never   Marital Status: Divorced   Outpatient Encounter Medications as of 11/03/2020  Medication Sig   citalopram (CELEXA) 20 MG tablet Take 2 tablets (40 mg total) by mouth daily.   clopidogrel (PLAVIX) 75 MG tablet Take 1 tablet (75 mg total) by mouth daily.   meclizine (ANTIVERT) 12.5 MG tablet Take 1 tablet (12.5 mg total) by mouth 2 (two) times daily.   mirtazapine (REMERON) 15 MG tablet Take 1 tablet (15 mg total) by mouth at bedtime.   rosuvastatin (CRESTOR) 20 MG tablet Take 1 tablet (20 mg total) by mouth daily.   Accu-Chek Softclix Lancets lancets Use as instructed   albuterol (PROVENTIL HFA;VENTOLIN HFA) 108 (90 Base) MCG/ACT inhaler Inhale 2 puffs into the lungs every 6 (six) hours as needed for wheezing or shortness of breath. (Patient not taking: Reported on 11/03/2020)   Alcohol Swabs (B-D SINGLE USE SWABS REGULAR) PADS USE AS DIRECTED EVERY DAY   Blood Glucose Monitoring Suppl DEVI 1 Units by Does not apply route daily.   docusate sodium (COLACE) 100 MG capsule Take 1 capsule (100 mg total) by mouth 2 (two) times daily.   glucose blood (ACCU-CHEK GUIDE) test strip Use as instructed   Incontinence Supply Disposable (ATTENDS BREATHABLE BRIEFS MED) MISC 1 application by Does not apply route daily as needed. (Patient not taking: Reported on 11/06/2019)   minoxidil  (MINOXIDIL FOR WOMEN) 2 % external solution Apply topically 2 (two) times daily. (Patient not taking: No sig reported)   vitamin B-12 (CYANOCOBALAMIN) 1000 MCG tablet Take 1,000 mcg by mouth daily. (Patient not taking: Reported on 11/03/2020)   No facility-administered encounter medications on file as of 11/03/2020.   Activities of Daily Living In your present state of health, do you have any difficulty performing the following activities: 11/03/2020 11/06/2019  Hearing? N N  Vision? N Y  Difficulty concentrating or making decisions? Y N  Walking or climbing stairs? N Y  Dressing or bathing? N N  Doing errands, shopping? Y N  Comment does not own person vehicle -  Conservation officer, nature and eating ? N N  Using the Toilet? N N  In the past six months, have you accidently leaked urine? Y Y  Do you have problems with loss of bowel control? N N  Managing your Medications? N Y  Managing your Finances? N N  Housekeeping or managing your Housekeeping? N N  Some recent data might be hidden   Patient Care Team: Zenia Resides, MD as PCP - General (Family Medicine)    Assessment:   This is a routine wellness examination for [employer].  Exercise Activities and Dietary recommendations Exercise limited by: respiratory conditions(s);psychological condition(s)   Goals      Find Help in My Community     Timeframe:  Short-Term Goal Priority:  High Start Date:   07/20/2020                          Expected End Date:                       I have placed a referral  with [facility] for meal on wheels. They will call you.     Quality of life improved     Patient Goals/Self-Care Activities: Over the next 30 days  I have contacted [employer] about getting your wipes      Quit Smoking     Cessation materials given. Patient is down 2PPD to .5PPD. Congratulated patient and offered support and education.        Fall Risk Fall Risk  11/03/2020 07/14/2020 11/06/2019 08/05/2015 12/09/2014  Falls in the past  year? - - 1 Yes Yes  Number falls in past yr: 0 1 1 - -  Injury with Fall? 1 0 0 - -  Risk for fall due to : Impaired balance/gait;Impaired mobility - - - -   Patient does report an unsteady gait and balance issues. Patient has been told to use a cane to help ambulate. Patient does not know what "good" a cane will do.   Is the patient's home free of loose throw rugs in walkways, pet beds, electrical cords, etc?   yes      Grab bars in the bathroom? yes      Handrails on the stairs?   yes      Adequate lighting?   yes  Patient rating of health (0-10) scale: 5   Depression Screen PHQ 2/9 Scores 11/03/2020 11/06/2019 03/22/2017 03/28/2016  PHQ - 2 Score 2 6 1 1   PHQ- 9 Score 12 18 - -    Cognitive Function MMSE - Mini Mental State Exam 06/05/2014  Orientation to time 4  Orientation to Place 4  Registration 3  Attention/ Calculation 5  Recall 2  Language- name 2 objects 2  Language- repeat 1  Language- follow 3 step command 2  Language- read & follow direction 1  Write a sentence 1  Copy design 1  Total score 26   6CIT Screen 11/03/2020 11/06/2019  What Year? 0 points 0 points  What month? 0 points 0 points  What time? 0 points 0 points  Count back from 20 0 points 0 points  Months in reverse 0 points 0 points  Repeat phrase 0 points 0 points  Total Score 0 0   Immunization History  Administered Date(s) Administered   Influenza,inj,Quad PF,6+ Mos 11/21/2013, 12/09/2014   PNEUMOCOCCAL CONJUGATE-20  07/14/2020   Pneumococcal Polysaccharide-23 08/29/2013   Screening Tests Health Maintenance  Topic Date Due   COVID-19 Vaccine (1) Never done   TETANUS/TDAP  Never done   COLONOSCOPY (Pts 45-75yrs Insurance coverage will need to be confirmed)  Never done   Zoster Vaccines- Shingrix (1 of 2) Never done   MAMMOGRAM  12/29/2016   DEXA SCAN  Never done   INFLUENZA VACCINE  08/23/2020   Hepatitis C Screening  Completed   HPV VACCINES  Aged Out   Flu Vaccine status: Due,  Education has been provided regarding the importance of this vaccine. Advised may receive this vaccine at local pharmacy or Health Dept. Aware to provide a copy of the vaccination record if obtained from local pharmacy or Health Dept. Verbalized acceptance and understanding.   Covid-19 vaccine status: Information provided on how to obtain vaccines. Patient does report she has had "several" covid vaccines. However, I do not see any record of this. Patient told to bring covid vaccine card to next apt.     Qualifies for Shingles Vaccine? Yes   Zostavax completed No   Shingrix Completed?: No. Education has been provided regarding the importance of this vaccine. Patient has been advised to call insurance company to determine out of pocket expense if they have not yet received this vaccine. Advised may also receive vaccine at local pharmacy or Health Dept. Verbalized acceptance and understanding.  Cancer Screenings: Lung: Low Dose CT Chest recommended if Age 70-80 years, 30 pack-year currently smoking OR have quit w/in 15years. Patient does qualify. Breast:  Up to date on Mammogram? Apt scheduled for 11/30/2020.   Up to date of Bone Density/Dexa? No Colorectal: Never- education provided.  Additional Screenings: Hepatitis C Screening: Completed  HIV Screening: Completed  Plan:  PCP apt 10/19. Bring covid vaccine card. Mammogram apt 11/8.  I have personally reviewed and noted the following in the patient's chart:   Medical and social history Use of alcohol, tobacco or illicit drugs  Current medications and supplements Functional ability and status Nutritional status Physical activity Advanced directives List of other physicians Hospitalizations, surgeries, and ER visits in previous 12 months Vitals Screenings to include cognitive, depression, and falls Referrals and appointments  In addition, I have reviewed and discussed with patient certain preventive protocols, quality metrics, and  best practice recommendations. A written personalized care plan for preventive services as well as general preventive health recommendations were provided to patient.  This visit was conducted virtually in the setting of the [place] pandemic.    Dorna Bloom, [place]  11/03/2020

## 2020-11-04 NOTE — Patient Instructions (Addendum)
You spoke to Rachel Bryan, [place] over the phone for your annual wellness visit.  We discussed goals:   Goals      Find Help in My Community     Timeframe:  Short-Term Goal Priority:  High Start Date:   07/20/2020                          Expected End Date:                       I have placed a referral  with [facility] for meal on wheels. They will call you.     Quality of life improved     Patient Goals/Self-Care Activities: Over the next 30 days  I have contacted [employer] about getting your wipes      Quit Smoking     Cessation materials given. Patient is down 2PPD to .5PPD. Congratulated patient and offered support and education.        We also discussed recommended health maintenance. As discussed, you are due for: Health Maintenance  Topic Date Due   COVID-19 Vaccine (1) Never done   TETANUS/TDAP  Never done   COLONOSCOPY (Pts 45-52yrs Insurance coverage will need to be confirmed)  Never done   Zoster Vaccines- Shingrix (1 of 2) Never done   MAMMOGRAM  12/29/2016   DEXA SCAN  Never done   INFLUENZA VACCINE  08/23/2020   Hepatitis C Screening  Completed   HPV VACCINES  Aged Out   PCP apt 10/19. Bring covid vaccine card. Mammogram apt 11/8.  Health Maintenance, Female Adopting a healthy lifestyle and getting preventive care are important in promoting health and wellness. Ask your health care provider about: The right schedule for you to have regular tests and exams. Things you can do on your own to prevent diseases and keep yourself healthy. What should I know about diet, weight, and exercise? Eat a healthy diet  Eat a diet that includes plenty of vegetables, fruits, low-fat dairy products, and lean protein. Do not eat a lot of foods that are high in solid fats, added sugars, or sodium. Maintain a healthy weight Body mass index (BMI) is used to identify weight problems. It estimates body fat based on height and weight. Your health care provider can help determine  your BMI and help you achieve or maintain a healthy weight. Get regular exercise Get regular exercise. This is one of the most important things you can do for your health. Most adults should: Exercise for at least 150 minutes each week. The exercise should increase your heart rate and make you sweat (moderate-intensity exercise). Do strengthening exercises at least twice a week. This is in addition to the moderate-intensity exercise. Spend less time sitting. Even light physical activity can be beneficial. Watch cholesterol and blood lipids Have your blood tested for lipids and cholesterol at 68 years of age, then have this test every 5 years. Have your cholesterol levels checked more often if: Your lipid or cholesterol levels are high. You are older than 68 years of age. You are at high risk for heart disease. What should I know about cancer screening? Depending on your health history and family history, you may need to have cancer screening at various ages. This may include screening for: Breast cancer. Cervical cancer. Colorectal cancer. Skin cancer. Lung cancer. What should I know about heart disease, diabetes, and high blood pressure? Blood pressure and heart disease High  blood pressure causes heart disease and increases the risk of stroke. This is more likely to develop in people who have high blood pressure readings, are of African descent, or are overweight. Have your blood pressure checked: Every 3-5 years if you are 15-2 years of age. Every year if you are 87 years old or older. Diabetes Have regular diabetes screenings. This checks your fasting blood sugar level. Have the screening done: Once every three years after age 85 if you are at a normal weight and have a low risk for diabetes. More often and at a younger age if you are overweight or have a high risk for diabetes. What should I know about preventing infection? Hepatitis B If you have a higher risk for hepatitis B,  you should be screened for this virus. Talk with your health care provider to find out if you are at risk for hepatitis B infection. Hepatitis C Testing is recommended for: Everyone born from [birth year] through 1963. Anyone with known risk factors for hepatitis C. Sexually transmitted infections (STIs) Get screened for STIs, including gonorrhea and chlamydia, if: You are sexually active and are younger than 68 years of age. You are older than 68 years of age and your health care provider tells you that you are at risk for this type of infection. Your sexual activity has changed since you were last screened, and you are at increased risk for chlamydia or gonorrhea. Ask your health care provider if you are at risk. Ask your health care provider about whether you are at high risk for HIV. Your health care provider may recommend a prescription medicine to help prevent HIV infection. If you choose to take medicine to prevent HIV, you should first get tested for HIV. You should then be tested every 3 months for as long as you are taking the medicine. Pregnancy If you are about to stop having your period (premenopausal) and you may become pregnant, seek counseling before you get pregnant. Take 400 to 800 micrograms (mcg) of folic acid every day if you become pregnant. Ask for birth control (contraception) if you want to prevent pregnancy. Osteoporosis and menopause Osteoporosis is a disease in which the bones lose minerals and strength with aging. This can result in bone fractures. If you are 58 years old or older, or if you are at risk for osteoporosis and fractures, ask your health care provider if you should: Be screened for bone loss. Take a calcium or vitamin D supplement to lower your risk of fractures. Be given hormone replacement therapy (HRT) to treat symptoms of menopause. Follow these instructions at home: Lifestyle Do not use any products that contain nicotine or tobacco, such as cigarettes,  e-cigarettes, and chewing tobacco. If you need help quitting, ask your health care provider. Do not use street drugs. Do not share needles. Ask your health care provider for help if you need support or information about quitting drugs. Alcohol use Do not drink alcohol if: Your health care provider tells you not to drink. You are pregnant, may be pregnant, or are planning to become pregnant. If you drink alcohol: Limit how much you use to 0-1 drink a day. Limit intake if you are breastfeeding. Be aware of how much alcohol is in your drink. In the [place] one drink equals one 12 oz bottle of beer (355 mL), one 5 oz glass of wine (148 mL), or one 1 oz glass of hard liquor (44 mL). General instructions Schedule regular health, dental, and eye  exams. Stay current with your vaccines. Tell your health care provider if: You often feel depressed. You have ever been abused or do not feel safe at home. Summary Adopting a healthy lifestyle and getting preventive care are important in promoting health and wellness. Follow your health care provider's instructions about healthy diet, exercising, and getting tested or screened for diseases. Follow your health care provider's instructions on monitoring your cholesterol and blood pressure. This information is not intended to replace advice given to you by your health care provider. Make sure you discuss any questions you have with your health care provider. Document Revised: 03/19/2020 Document Reviewed: 01/02/2018 Elsevier Patient Education  2022 [employer].   Our clinic's number is [phone]. Please call with questions or concerns about what we discussed today.

## 2020-11-04 NOTE — Progress Notes (Signed)
I have reviewed this visit and agree with the documentation.   

## 2020-11-07 DIAGNOSIS — R0902 Hypoxemia: Secondary | ICD-10-CM | POA: Diagnosis not present

## 2020-11-07 DIAGNOSIS — R079 Chest pain, unspecified: Secondary | ICD-10-CM | POA: Diagnosis not present

## 2020-11-07 DIAGNOSIS — R0789 Other chest pain: Secondary | ICD-10-CM | POA: Diagnosis not present

## 2020-11-08 ENCOUNTER — Ambulatory Visit: Payer: Medicare Other | Admitting: Family Medicine

## 2020-11-10 ENCOUNTER — Encounter: Payer: Self-pay | Admitting: Family Medicine

## 2020-11-10 ENCOUNTER — Ambulatory Visit (INDEPENDENT_AMBULATORY_CARE_PROVIDER_SITE_OTHER): Payer: Medicare Other | Admitting: Family Medicine

## 2020-11-10 ENCOUNTER — Other Ambulatory Visit: Payer: Self-pay

## 2020-11-10 VITALS — BP 112/64 | HR 83 | Ht 67.0 in | Wt 110.6 lb

## 2020-11-10 DIAGNOSIS — E7800 Pure hypercholesterolemia, unspecified: Secondary | ICD-10-CM | POA: Diagnosis not present

## 2020-11-10 DIAGNOSIS — F32A Depression, unspecified: Secondary | ICD-10-CM

## 2020-11-10 DIAGNOSIS — I7410 Embolism and thrombosis of unspecified parts of aorta: Secondary | ICD-10-CM | POA: Diagnosis not present

## 2020-11-10 DIAGNOSIS — G4762 Sleep related leg cramps: Secondary | ICD-10-CM

## 2020-11-10 DIAGNOSIS — G4700 Insomnia, unspecified: Secondary | ICD-10-CM | POA: Diagnosis not present

## 2020-11-10 DIAGNOSIS — R630 Anorexia: Secondary | ICD-10-CM

## 2020-11-10 DIAGNOSIS — Z23 Encounter for immunization: Secondary | ICD-10-CM

## 2020-11-10 DIAGNOSIS — R079 Chest pain, unspecified: Secondary | ICD-10-CM | POA: Diagnosis not present

## 2020-11-10 MED ORDER — TRAZODONE HCL 50 MG PO TABS: 25.0000 mg | ORAL_TABLET | Freq: Every evening | ORAL | 3 refills | Status: DC | PRN

## 2020-11-10 MED ORDER — NITROGLYCERIN 0.4 MG SL SUBL: 0.4000 mg | SUBLINGUAL_TABLET | SUBLINGUAL | 12 refills | Status: AC | PRN

## 2020-11-10 NOTE — Patient Instructions (Signed)
For your chest pain, I will check a blood test to be sure the cholesterol medicine is working.  And I have sent in a prescription for nitroglycerin under the tongue.  We will see how thing go with the stress calming down.  If you have more chest pain, I will send you to the heart doctor. For the leg cramps, I am checking blood work again.  Remember to stretch every night before bed. I sent in a new pill for sleep.  Remember, no TV for at least 30 minutes before bed.   Remember, you and your sister both promised to quit smoking.  That is the single biggest thing you could do to help your long term health. See me in 3-4 weeks to chip away at these other problems.  Please make an appointment on the way out. I will will call tomorrow with blood.work results.

## 2020-11-11 LAB — BASIC METABOLIC PANEL
BUN/Creatinine Ratio: 17 (ref 12–28)
BUN: 15 mg/dL (ref 8–27)
CO2: 23 mmol/L (ref 20–29)
Calcium: 10 mg/dL (ref 8.7–10.3)
Chloride: 101 mmol/L (ref 96–106)
Creatinine, Ser: 0.86 mg/dL (ref 0.57–1.00)
Glucose: 97 mg/dL (ref 70–99)
Potassium: 4.4 mmol/L (ref 3.5–5.2)
Sodium: 140 mmol/L (ref 134–144)
eGFR: 74 mL/min/{1.73_m2} (ref >59)

## 2020-11-11 LAB — LDL CHOLESTEROL, DIRECT: LDL Direct: 95 mg/dL (ref 0–99)

## 2020-11-11 LAB — MAGNESIUM: Magnesium: 2.1 mg/dL (ref 1.6–2.3)

## 2020-11-11 MED ORDER — ROSUVASTATIN CALCIUM 40 MG PO TABS: 40.0000 mg | ORAL_TABLET | Freq: Every day | ORAL | 3 refills | Status: DC

## 2020-11-11 NOTE — Assessment & Plan Note (Signed)
Direct LDL. Called with lab results.  LDL improved but not at goal.  Will increase crestor to 40 mg daily.

## 2020-11-12 ENCOUNTER — Encounter: Payer: Self-pay | Admitting: Family Medicine

## 2020-11-12 NOTE — Assessment & Plan Note (Signed)
Decent description for cardiac origin.  Already treating as secondary prevention with plavix and high intensity statin.  Add nitro SL prn.  May need cards referral if recurs.

## 2020-11-12 NOTE — Assessment & Plan Note (Signed)
Labs checked and OK.  Treat with QHS stretching.

## 2020-11-12 NOTE — Assessment & Plan Note (Signed)
Hopefully will improve with grandson out of house.

## 2020-11-12 NOTE — Progress Notes (Signed)
    SUBJECTIVE:   CHIEF COMPLAINT / HPI:   Multiple issues: Deferred some to next visit. Single episode of chest pain.  EMS called.  + palpitations and radiate down left arm.  Left ant pain.  No lightheadedness or diaphorsis.  Resolved in ~30 minutes.  EMS reported called it a "panic attack" and "angina."  EMS did not transport to hosp.  No episodes since. Stress: Like on the cusp of improving.  Major cause of stress was her grandson who was living with her.  "I did not come to the doctor because I worried what he would do while I was gone."  Was afraid to leave the house.  She kicked grandson out.  Now the house is "too quiet."  She is adjusting. Insomnia.  Mirtazipine did not help.  Watches TV until tired, goes to bed and can't sleep. Poor appetite.  Mirtazipine did not help.   Noctural leg cramps. Ready to quit smoking.  Came in with her sister.  Both need to quit.  In the room, both made a pact to quit together.  Deferred issues: Incontinence and falling.   OBJECTIVE:   BP 112/64   Pulse 83   Ht 5\' 7"  (1.702 m)   Wt 110 lb 9.6 oz (50.2 kg)   SpO2 99%   BMI 17.32 kg/m   VS and weight noted.  Wt down a couple of pounds. Lungs clear Cardiac RRR without m or g Abd benign Ext no edema.  Pedal pulses OK.  ASSESSMENT/PLAN:   Aortic mural thrombus (HCC) Direct LDL. Called with lab results.  LDL improved but not at goal.  Will increase crestor to 40 mg daily.  Depression Hopefully will improve with grandson out of house.    Insomnia Add trazodone.  No tV at least 30 minutes prior to bedtime.  Chest pain Decent description for cardiac origin.  Already treating as secondary prevention with plavix and high intensity statin.  Add nitro SL prn.  May need cards referral if recurs.    Nocturnal leg cramps Labs checked and OK.  Treat with QHS stretching.  Loss of appetite Previous labs reassuring.  Will need further WU if weight loss continues.    Zenia Resides, MD [place]

## 2020-11-12 NOTE — Assessment & Plan Note (Signed)
Previous labs reassuring.  Will need further WU if weight loss continues.

## 2020-11-12 NOTE — Assessment & Plan Note (Signed)
Add trazodone.  No tV at least 30 minutes prior to bedtime.

## 2020-11-30 ENCOUNTER — Ambulatory Visit: Payer: Medicare Other

## 2020-12-02 ENCOUNTER — Other Ambulatory Visit: Payer: Self-pay | Admitting: Family Medicine

## 2020-12-02 DIAGNOSIS — R42 Dizziness and giddiness: Secondary | ICD-10-CM

## 2020-12-06 ENCOUNTER — Ambulatory Visit: Payer: Medicare Other | Admitting: Family Medicine

## 2020-12-29 ENCOUNTER — Other Ambulatory Visit: Payer: Self-pay | Admitting: Family Medicine

## 2020-12-29 ENCOUNTER — Ambulatory Visit (INDEPENDENT_AMBULATORY_CARE_PROVIDER_SITE_OTHER): Payer: Medicare Other | Admitting: Family Medicine

## 2020-12-29 ENCOUNTER — Other Ambulatory Visit: Payer: Self-pay

## 2020-12-29 ENCOUNTER — Encounter: Payer: Self-pay | Admitting: Family Medicine

## 2020-12-29 VITALS — BP 148/74 | HR 94 | Ht 67.0 in | Wt 118.2 lb

## 2020-12-29 DIAGNOSIS — G4700 Insomnia, unspecified: Secondary | ICD-10-CM | POA: Diagnosis not present

## 2020-12-29 DIAGNOSIS — F32A Depression, unspecified: Secondary | ICD-10-CM | POA: Diagnosis not present

## 2020-12-29 DIAGNOSIS — F4321 Adjustment disorder with depressed mood: Secondary | ICD-10-CM

## 2020-12-29 DIAGNOSIS — R030 Elevated blood-pressure reading, without diagnosis of hypertension: Secondary | ICD-10-CM

## 2020-12-29 DIAGNOSIS — Z1239 Encounter for other screening for malignant neoplasm of breast: Secondary | ICD-10-CM

## 2020-12-29 DIAGNOSIS — R630 Anorexia: Secondary | ICD-10-CM

## 2020-12-29 DIAGNOSIS — N3941 Urge incontinence: Secondary | ICD-10-CM

## 2020-12-29 MED ORDER — ALBUTEROL SULFATE HFA 108 (90 BASE) MCG/ACT IN AERS: 2.0000 | INHALATION_SPRAY | Freq: Four times a day (QID) | RESPIRATORY_TRACT | 0 refills | Status: DC | PRN

## 2020-12-29 MED ORDER — MIRABEGRON ER 25 MG PO TB24: 25.0000 mg | ORAL_TABLET | Freq: Every day | ORAL | 2 refills | Status: DC

## 2020-12-29 MED ORDER — MIRTAZAPINE 30 MG PO TABS: 30.0000 mg | ORAL_TABLET | Freq: Every day | ORAL | 3 refills | Status: DC

## 2020-12-29 MED ORDER — TRAZODONE HCL 100 MG PO TABS: 100.0000 mg | ORAL_TABLET | Freq: Every day | ORAL | 3 refills | Status: DC

## 2020-12-29 NOTE — Patient Instructions (Addendum)
I refilled and increased the dose of trazadone from 50 to 100 and the mirtazapine from 15 to 30.  I hope that gives you better sleep and helps your headaches. I refilled your inhaler I started a new medicine that I hope helps your urine leakage, myrbetriq.   Please get a mammogram before you see me next.   I am glad you have less stress. Get out more.

## 2020-12-31 ENCOUNTER — Encounter: Payer: Self-pay | Admitting: Family Medicine

## 2020-12-31 DIAGNOSIS — R030 Elevated blood-pressure reading, without diagnosis of hypertension: Secondary | ICD-10-CM | POA: Insufficient documentation

## 2020-12-31 NOTE — Assessment & Plan Note (Signed)
Continue to provide incontinence supplies.  Trial of mybetriq

## 2020-12-31 NOTE — Assessment & Plan Note (Signed)
Still problematic.  Increase trazadone on mirtazipine.

## 2020-12-31 NOTE — Progress Notes (Signed)
    SUBJECTIVE:   CHIEF COMPLAINT / HPI:   Multiple issues: Stress - relates to anxiety, insomnia and depression.  Improved some with son out of house (and [facility]  She did not feel safe with him.  Still sleeping poorly.  "I ain't happy.  I don't want to leave the house and go anywhere."   Incontinence.  She has the classic "gotta go" symptom of urge incontinence.  Uses four adult pullups daily.  Not on any meds. Financial difficulties.  Now has insurance, which does cover meds.  While she still faces financial stresses, having insurance is a big improvement. Involuntary weight loss.  Eating more.  Objectively, has gained 8 lbs BP up mildly here.  States taking meds and that her BP at home has been good.  PERTINENT  PMH / PSH: daily headaches.  Hands shaking.  Behind on health maint.  Feels unsteady on her feet.  OBJECTIVE:   BP (!) 148/74   Pulse 94   Ht 5\' 7"  (1.702 m)   Wt 118 lb 4 oz (53.6 kg)   SpO2 98%   BMI 18.52 kg/m   VS noted 8 lb weight gain is reassuring. Lungs clear Cardiac RRR without m or g    ASSESSMENT/PLAN:   Urinary incontinence Urge incontinence.  Continue to authorize incontinence supplies.  Now that she can afford meds, trial of myrbetriq.  Urge incontinence Continue to provide incontinence supplies.  Trial of mybetriq  Loss of appetite Improved with good weight gain.  Insomnia Still problematic.  Increase trazadone on mirtazipine.  Depression Some situational improvement.  Increase trazadone and mirtazipine.  Encouraged to get out more.  Elevated BP without diagnosis of hypertension Monitor.  Start meds if still elevated next visit.     Zenia Resides, MD [place]

## 2020-12-31 NOTE — Assessment & Plan Note (Signed)
Monitor.  Start meds if still elevated next visit.

## 2020-12-31 NOTE — Assessment & Plan Note (Signed)
Improved with good weight gain.

## 2020-12-31 NOTE — Assessment & Plan Note (Signed)
Some situational improvement.  Increase trazadone and mirtazipine.  Encouraged to get out more.

## 2020-12-31 NOTE — Assessment & Plan Note (Signed)
Urge incontinence.  Continue to authorize incontinence supplies.  Now that she can afford meds, trial of myrbetriq.

## 2021-01-18 ENCOUNTER — Other Ambulatory Visit: Payer: Self-pay | Admitting: Family Medicine

## 2021-01-18 DIAGNOSIS — R42 Dizziness and giddiness: Secondary | ICD-10-CM

## 2021-02-09 ENCOUNTER — Ambulatory Visit: Payer: Medicare Other | Admitting: Family Medicine

## 2021-02-14 ENCOUNTER — Other Ambulatory Visit: Payer: Self-pay | Admitting: Family Medicine

## 2021-02-14 DIAGNOSIS — R42 Dizziness and giddiness: Secondary | ICD-10-CM

## 2021-03-02 ENCOUNTER — Encounter: Payer: Self-pay | Admitting: Family Medicine

## 2021-03-02 ENCOUNTER — Ambulatory Visit (INDEPENDENT_AMBULATORY_CARE_PROVIDER_SITE_OTHER): Payer: 59 | Admitting: Family Medicine

## 2021-03-02 ENCOUNTER — Other Ambulatory Visit: Payer: Self-pay

## 2021-03-02 DIAGNOSIS — R630 Anorexia: Secondary | ICD-10-CM | POA: Diagnosis not present

## 2021-03-02 DIAGNOSIS — F32A Depression, unspecified: Secondary | ICD-10-CM

## 2021-03-02 DIAGNOSIS — N644 Mastodynia: Secondary | ICD-10-CM | POA: Diagnosis not present

## 2021-03-02 DIAGNOSIS — Z1211 Encounter for screening for malignant neoplasm of colon: Secondary | ICD-10-CM | POA: Diagnosis not present

## 2021-03-02 NOTE — Progress Notes (Signed)
Breas

## 2021-03-02 NOTE — Patient Instructions (Addendum)
Your breast exam is normal.  Still you need a diagnostic mammogram soon.  Someone should call to set up the appointment. Let me know if you develop fever or rash.   Someone should call about the mail in colon cancer test.

## 2021-03-03 ENCOUNTER — Encounter: Payer: Self-pay | Admitting: Family Medicine

## 2021-03-03 NOTE — Progress Notes (Signed)
° ° °  SUBJECTIVE:   CHIEF COMPLAINT / HPI:   Severe left breast pain x 1 week.  No trauma although she says "I may have slept on it wrong>"  No rash.  No fever or other sytemic sx.  Wt has nicely rebounded.   Does not want colonoscopy but is willing to do mail in colon cancer screen Still grieving loss of husband.  And she is lonely.  Hasn't had visit from grandchild in 44 months.     OBJECTIVE:   BP 129/67    Pulse 86    Ht 5\' 7"  (1.702 m)    Wt 129 lb (58.5 kg)    SpO2 100%    BMI 20.20 kg/m   Left breast - very tender to light palpation.  No mass or rash.  No axillary adenopathy. Affect normal throughout.  ASSESSMENT/PLAN:   Loss of appetite Resolved.  Wt has returned to previous baseline.  Breast pain, left Needs mammo despite normal exam.  Could be early shingles.    Colon cancer screening Cologuard ordered per patient preference.  Depression Doing well despite loss and ongoing family turmoil.     Zenia Resides, MD [place]

## 2021-03-03 NOTE — Assessment & Plan Note (Signed)
Cologuard ordered per patient preference.

## 2021-03-03 NOTE — Assessment & Plan Note (Signed)
Resolved.  Wt has returned to previous baseline.

## 2021-03-03 NOTE — Assessment & Plan Note (Signed)
Needs mammo despite normal exam.  Could be early shingles.

## 2021-03-03 NOTE — Assessment & Plan Note (Signed)
Doing well despite loss and ongoing family turmoil.

## 2021-03-04 ENCOUNTER — Other Ambulatory Visit: Payer: Self-pay | Admitting: Family Medicine

## 2021-03-04 DIAGNOSIS — N644 Mastodynia: Secondary | ICD-10-CM

## 2021-03-18 ENCOUNTER — Telehealth: Payer: Self-pay | Admitting: Family Medicine

## 2021-03-18 DIAGNOSIS — I7410 Embolism and thrombosis of unspecified parts of aorta: Secondary | ICD-10-CM

## 2021-03-18 LAB — COLOGUARD: COLOGUARD: NEGATIVE

## 2021-03-18 NOTE — Telephone Encounter (Signed)
Cologuard negative.  LM with patient.

## 2021-03-18 NOTE — Telephone Encounter (Signed)
Patient returns call to nurse line. Patient advised of normal Cologuard result.   Patient reports she is unable to refill Clopidogrel due to cost. Patient reports the medication is now ~60 dollars and she can not afford this. Patient reports she has ~6 pills left and she takes this daily.   Will forward to pharmacy team for assistance.

## 2021-03-22 MED ORDER — ASPIRIN EC 81 MG PO TBEC: 81.0000 mg | DELAYED_RELEASE_TABLET | Freq: Every day | ORAL | 11 refills | Status: AC

## 2021-03-22 NOTE — Addendum Note (Signed)
Addended by: Zenia Resides on: 03/22/2021 10:26 AM   Modules accepted: Orders

## 2021-03-22 NOTE — Telephone Encounter (Signed)
Called and informed patient to stop Plavix and start ASA 81 mg daily.

## 2021-03-22 NOTE — Assessment & Plan Note (Signed)
Switched from Plavix to ASA primarily for $ reasons.

## 2021-03-31 ENCOUNTER — Ambulatory Visit: Payer: 59

## 2021-03-31 ENCOUNTER — Ambulatory Visit
Admission: RE | Admit: 2021-03-31 | Discharge: 2021-03-31 | Disposition: A | Discharge status: Home / Self Care | Payer: Medicare HMO | Source: Ambulatory Visit | Attending: Family Medicine | Admitting: Family Medicine

## 2021-03-31 DIAGNOSIS — N644 Mastodynia: Secondary | ICD-10-CM

## 2021-05-12 ENCOUNTER — Other Ambulatory Visit: Payer: Self-pay | Admitting: Family Medicine

## 2021-05-12 DIAGNOSIS — N3941 Urge incontinence: Secondary | ICD-10-CM

## 2021-05-26 ENCOUNTER — Ambulatory Visit: Payer: Medicare HMO | Admitting: Family Medicine

## 2021-05-30 ENCOUNTER — Ambulatory Visit: Payer: Medicare HMO | Admitting: Family Medicine

## 2021-06-13 ENCOUNTER — Ambulatory Visit: Payer: Medicare HMO | Admitting: Family Medicine

## 2021-06-22 ENCOUNTER — Ambulatory Visit: Payer: Medicare HMO | Admitting: Family Medicine

## 2021-06-28 ENCOUNTER — Encounter: Payer: Self-pay | Admitting: *Deleted

## 2021-06-30 ENCOUNTER — Ambulatory Visit: Payer: Medicare HMO | Admitting: Family Medicine

## 2021-07-07 ENCOUNTER — Encounter: Payer: Self-pay | Admitting: Family Medicine

## 2021-07-07 ENCOUNTER — Ambulatory Visit (INDEPENDENT_AMBULATORY_CARE_PROVIDER_SITE_OTHER): Payer: Medicare HMO | Admitting: Family Medicine

## 2021-07-07 DIAGNOSIS — F32A Depression, unspecified: Secondary | ICD-10-CM | POA: Diagnosis not present

## 2021-07-07 DIAGNOSIS — R079 Chest pain, unspecified: Secondary | ICD-10-CM | POA: Diagnosis not present

## 2021-07-07 DIAGNOSIS — R413 Other amnesia: Secondary | ICD-10-CM | POA: Diagnosis not present

## 2021-07-07 DIAGNOSIS — N3941 Urge incontinence: Secondary | ICD-10-CM

## 2021-07-07 DIAGNOSIS — F419 Anxiety disorder, unspecified: Secondary | ICD-10-CM

## 2021-07-07 DIAGNOSIS — E7800 Pure hypercholesterolemia, unspecified: Secondary | ICD-10-CM

## 2021-07-07 DIAGNOSIS — R251 Tremor, unspecified: Secondary | ICD-10-CM

## 2021-07-07 DIAGNOSIS — R69 Illness, unspecified: Secondary | ICD-10-CM | POA: Diagnosis not present

## 2021-07-07 MED ORDER — BUSPIRONE HCL 5 MG PO TABS: 5.0000 mg | ORAL_TABLET | Freq: Three times a day (TID) | ORAL | 2 refills | Status: DC

## 2021-07-07 NOTE — Patient Instructions (Addendum)
We will wean you off the mirtazipine since it is not working.  Starting today, take a 1/2 each night for two weeks then stop. You are at a great weight right now.  135lbs I will call with blood test results I hope the new nerve medicine helps.   See me in the next month or two to recheck.

## 2021-07-08 ENCOUNTER — Encounter: Payer: Self-pay | Admitting: Family Medicine

## 2021-07-08 LAB — BASIC METABOLIC PANEL
BUN/Creatinine Ratio: 14 (ref 12–28)
BUN: 14 mg/dL (ref 8–27)
CO2: 22 mmol/L (ref 20–29)
Calcium: 10.6 mg/dL — ABNORMAL HIGH (ref 8.7–10.3)
Chloride: 99 mmol/L (ref 96–106)
Creatinine, Ser: 1.03 mg/dL — ABNORMAL HIGH (ref 0.57–1.00)
Glucose: 102 mg/dL — ABNORMAL HIGH (ref 70–99)
Potassium: 4.6 mmol/L (ref 3.5–5.2)
Sodium: 142 mmol/L (ref 134–144)
eGFR: 59 mL/min/{1.73_m2} — ABNORMAL LOW (ref >59)

## 2021-07-08 LAB — LIPID PANEL
Chol/HDL Ratio: 2.8 ratio (ref 0.0–4.4)
Cholesterol, Total: 178 mg/dL (ref 100–199)
HDL: 63 mg/dL (ref >39)
LDL Chol Calc (NIH): 94 mg/dL (ref 0–99)
Triglycerides: 117 mg/dL (ref 0–149)
VLDL Cholesterol Cal: 21 mg/dL (ref 5–40)

## 2021-07-08 LAB — TSH: TSH: 3.18 u[IU]/mL (ref 0.450–4.500)

## 2021-07-08 NOTE — Assessment & Plan Note (Signed)
Poor response to mirtazipine.  Will try prn buspar since the anxiety and frustration are intermitant.

## 2021-07-08 NOTE — Assessment & Plan Note (Signed)
Doubt cardiac origin.  Given paucity of previous work up, I will consider Coronary calcium CT next visit. For now treat likely cause of stress.

## 2021-07-08 NOTE — Assessment & Plan Note (Signed)
Treat anxiety and insomnia.  Consider WU for reversible causes of dementia if sx persist when anxiety is better controled.

## 2021-07-08 NOTE — Progress Notes (Signed)
    SUBJECTIVE:   CHIEF COMPLAINT / HPI:   Several issues: Stress.  Patient has somatic complaints of chest pain and hands shaking which she sees a clear pattern of occurring during stressful situations.  Most of the stress centers around family dynamic.  Daughter (Rachel Bryan, also my patient) and Rachel Bryan's husband Rachel Bryan, my patient) live [place].  They impose on her for lots of babysitting.  She also has very different approach to parenting of the granddaughter, Rachel Bryan, also my patient.  Great difficulty sleeping, especially after a stressful interaction.  "Mirtazapine does not work at all."  Cannot afford to move and has transportation difficulties - finances are also a stress.   Incontinence, urinary, multiple times per day.  Needs pads.  Myrbetric helps but does not fully control the incontience. Vertigo, still bothersome but less.  Feels the low dose meclizine helps. Weight improved.  Had involuntary weight loss.  Now has a roommate who is cooking healthy meals.   CP.  On statin and ASA.  No proven CAD. No caridology.    Memory difficulties, No red flag sx.  Clear worse when frazzled or when lack of sleep.  Does have strong FHx of dementia, but happens at age of 18-90    OBJECTIVE:   BP 123/73   Pulse 76   Ht '5\' 7"'$  (1.702 m)   Wt 135 lb 3.2 oz (61.3 kg)   SpO2 99%   BMI 21.18 kg/m   Much better general appearance.  Now at healthy weight. Affect: Does get worked up when talking about the family  Lungs clear Cardiac RRR without m or g Ext trace bilateral edema.  ASSESSMENT/PLAN:   Chest pain Doubt cardiac origin.  Given paucity of previous work up, I will consider Coronary calcium CT next visit. For now treat likely cause of stress.    Anxiety and depression Poor response to mirtazipine.  Will try prn buspar since the anxiety and frustration are intermitant.  Memory difficulties Treat anxiety and insomnia.  Consider WU for reversible causes of dementia if sx persist  when anxiety is better controled.    Urge incontinence Continue mybetric and incontenence supplies.     Rachel Resides, MD [place]

## 2021-07-08 NOTE — Assessment & Plan Note (Signed)
Continue mybetric and incontenence supplies.

## 2021-07-19 ENCOUNTER — Telehealth: Payer: Self-pay

## 2021-07-19 DIAGNOSIS — R052 Subacute cough: Secondary | ICD-10-CM

## 2021-07-20 ENCOUNTER — Ambulatory Visit
Admission: RE | Admit: 2021-07-20 | Discharge: 2021-07-20 | Disposition: A | Discharge status: Home / Self Care | Payer: Medicare HMO | Source: Ambulatory Visit | Attending: Family Medicine | Admitting: Family Medicine

## 2021-07-20 DIAGNOSIS — R059 Cough, unspecified: Secondary | ICD-10-CM | POA: Insufficient documentation

## 2021-07-20 DIAGNOSIS — J449 Chronic obstructive pulmonary disease, unspecified: Secondary | ICD-10-CM

## 2021-07-20 DIAGNOSIS — Z720 Tobacco use: Secondary | ICD-10-CM

## 2021-07-20 DIAGNOSIS — R052 Subacute cough: Secondary | ICD-10-CM

## 2021-07-20 NOTE — Telephone Encounter (Signed)
Cough x 2 weeks, not improving.  Denies fever and SOB.  Did have a deep, rattling coughing spell while I was talking to her.  I am concerned for possible pneumonia.  Ordered a CXR.  She has transportation issues but feels fairly certain that she can get a ride.  I will decide treatment and call again when I see the xray results.

## 2021-07-21 ENCOUNTER — Telehealth: Payer: Self-pay | Admitting: Family Medicine

## 2021-07-21 DIAGNOSIS — J449 Chronic obstructive pulmonary disease, unspecified: Secondary | ICD-10-CM | POA: Insufficient documentation

## 2021-07-21 DIAGNOSIS — Z720 Tobacco use: Secondary | ICD-10-CM | POA: Insufficient documentation

## 2021-07-21 MED ORDER — PREDNISONE 20 MG PO TABS: 40.0000 mg | ORAL_TABLET | Freq: Every day | ORAL | 0 refills | Status: DC

## 2021-07-21 MED ORDER — AZITHROMYCIN 500 MG PO TABS: 500.0000 mg | ORAL_TABLET | Freq: Every day | ORAL | 0 refills | Status: DC

## 2021-07-21 NOTE — Assessment & Plan Note (Signed)
Will treat as a COPD exacerbation.  Once back to normal, will make appointment with Dr. Valentina Lucks for PFTs and to discuss smoking cessation.

## 2021-07-21 NOTE — Telephone Encounter (Signed)
Called patient with CXR result (no acute process.)  Confirmed that she continues to smoke.  Not previously dxed with COPD.  Will treat as a COPD exacerbation.  Once back to her baseline, she will make an appointment with Dr. Valentina Lucks for PFTs and tobacco cessation.

## 2021-07-28 NOTE — Telephone Encounter (Signed)
Patient calls nurse line reporting continued cough.   Patient reports she finished Azithromycin and Prednisone with minimal relief in symptoms.   Patient denies any fevers or SOB. Patient does report she has been using her inhaler more often.   Patient scheduled for tomorrow for evaluation.   Red flags discussed in the meantime.

## 2021-07-29 ENCOUNTER — Ambulatory Visit (INDEPENDENT_AMBULATORY_CARE_PROVIDER_SITE_OTHER): Payer: Medicare HMO | Admitting: Family Medicine

## 2021-07-29 ENCOUNTER — Encounter: Payer: Self-pay | Admitting: Family Medicine

## 2021-07-29 VITALS — BP 147/70 | HR 71 | Ht 67.0 in | Wt 135.0 lb

## 2021-07-29 DIAGNOSIS — R052 Subacute cough: Secondary | ICD-10-CM | POA: Diagnosis not present

## 2021-07-29 DIAGNOSIS — Z720 Tobacco use: Secondary | ICD-10-CM

## 2021-07-29 DIAGNOSIS — N3941 Urge incontinence: Secondary | ICD-10-CM

## 2021-07-29 MED ORDER — BENZONATATE 100 MG PO CAPS: 100.0000 mg | ORAL_CAPSULE | Freq: Two times a day (BID) | ORAL | 0 refills | Status: DC | PRN

## 2021-07-29 MED ORDER — NICOTINE POLACRILEX 2 MG MT GUM: 2.0000 mg | CHEWING_GUM | OROMUCOSAL | 0 refills | Status: AC | PRN

## 2021-07-29 MED ORDER — NICOTINE 14 MG/24HR TD PT24: 14.0000 mg | MEDICATED_PATCH | Freq: Every day | TRANSDERMAL | 0 refills | Status: AC

## 2021-07-29 NOTE — Patient Instructions (Addendum)
It was great to see you!  Your cough may take several more weeks to heal. I have sent a prescription for Tessalon to help with your symptoms. It's ok to stop this if you don't find it helpful.   A teaspoon of honey can be extremely helpful for cough-- try this several times per day.  The most important thing you can do to help your cough is to quit smoking. We will work with you at future visits and consider medications to help you quit.  When you feel better, see Dr Valentina Lucks for lung function testing.  Feel better, Dr Rock Nephew

## 2021-07-29 NOTE — Progress Notes (Signed)
    SUBJECTIVE:   CHIEF COMPLAINT / HPI:   Cough Started 3 weeks ago Non-productive Never had fever, congestion, sore throat at any point Completed course of azithro and prednisone for presumed COPD exacerbation (no diagnosis of COPD but is a long time smoker). Just finished prednisone today. No significant improvement after abx and steroids Had normal CXR recently Has tried mucinex, robitussin DM, cepacol, sudafed, tussin, onion juice with sugar-- none have helped States "seems like it's trying to get better" but still with significant cough Using her albuterol inhaler but it's not helpful Continues to smoke 1/2 PPD  PERTINENT  PMH / PSH: tobacco abuse  OBJECTIVE:   BP (!) 147/70   Pulse 71   Ht '5\' 7"'$  (1.702 m)   Wt 135 lb (61.2 kg)   SpO2 99%   BMI 21.14 kg/m   Gen: NAD, able to participate in exam CV: RRR, normal S1/S2, no murmur Resp: Normal effort, mildly diminished breath sounds throughout but lungs CTAB without wheezes/rhonchi/rales Extremities: no edema or cyanosis Skin: warm and dry Neuro: alert, no obvious focal deficits Psych: Normal affect and mood  ASSESSMENT/PLAN:   Cough Subacute. Likely related to underlying COPD with ongoing tobacco use. Perhaps there is a component of post-viral cough that has yet to resolve. Doubt PNA or other underlying infection given recent normal CXR. -Rx sent for Tessalon '100mg'$  BID prn -Recommended honey -Discussed realistic time frame for improvement in light of current smoking -Smoking cessation counseling provided today. See below -See Dr Valentina Lucks for PFTs (and smoking cessation)  Tobacco abuse Currently smoking 1/2PPD. Interested in quitting. She set goal of <5 cigarettes by the time of her next appointment (July 20th). Rx sent for nicotine patch and gum. Patient would benefit from additional smoking cessation resources + oral meds if interested. Will defer to PCP/Dr Valentina Lucks      Alcus Dad, [place]

## 2021-07-29 NOTE — Assessment & Plan Note (Addendum)
Subacute. Likely related to underlying COPD with ongoing tobacco use. Perhaps there is a component of post-viral cough that has yet to resolve. Doubt PNA or other underlying infection given recent normal CXR. -Rx sent for Tessalon '100mg'$  BID prn -Recommended honey -Discussed realistic time frame for improvement in light of current smoking -Smoking cessation counseling provided today. See below -See Dr Valentina Lucks for PFTs (and smoking cessation)

## 2021-07-29 NOTE — Assessment & Plan Note (Signed)
Currently smoking 1/2PPD. Interested in quitting. She set goal of <5 cigarettes by the time of her next appointment (July 20th). Rx sent for nicotine patch and gum. Patient would benefit from additional smoking cessation resources + oral meds if interested. Will defer to PCP/Dr Koval.

## 2021-08-01 MED ORDER — MIRABEGRON ER 25 MG PO TB24
ORAL_TABLET | ORAL | 3 refills | Status: DC
Start: 2021-08-01 — End: 2021-11-14

## 2021-08-03 ENCOUNTER — Telehealth: Payer: Self-pay

## 2021-08-03 NOTE — Telephone Encounter (Signed)
Patient calls nurse line in regards to smoking cessation medications.   Patient insurance will only pay for Wellbutrin or Chantix if appropriate.   Will forward to PCP and provider who saw patient.   Patient does report improvement in cough.

## 2021-08-04 ENCOUNTER — Other Ambulatory Visit: Payer: Self-pay | Admitting: Family Medicine

## 2021-08-04 DIAGNOSIS — R42 Dizziness and giddiness: Secondary | ICD-10-CM

## 2021-08-04 NOTE — Telephone Encounter (Signed)
Patient has an apt with [name] on 7/20.

## 2021-08-04 NOTE — Telephone Encounter (Signed)
Please advise patient to schedule separate appointment to discuss initiation of Wellbutrin or Chantix, as this was not discussed during last visit. Can be with Dr. Valentina Lucks, Dr. Andria Frames, or myself- depending on availability/patient preference.  Alcus Dad, MD PGY-3, [place]

## 2021-08-11 ENCOUNTER — Encounter: Payer: Self-pay | Admitting: Family Medicine

## 2021-08-11 ENCOUNTER — Ambulatory Visit (INDEPENDENT_AMBULATORY_CARE_PROVIDER_SITE_OTHER): Payer: Medicare HMO | Admitting: Family Medicine

## 2021-08-11 DIAGNOSIS — J449 Chronic obstructive pulmonary disease, unspecified: Secondary | ICD-10-CM

## 2021-08-11 DIAGNOSIS — R052 Subacute cough: Secondary | ICD-10-CM | POA: Diagnosis not present

## 2021-08-11 DIAGNOSIS — M7989 Other specified soft tissue disorders: Secondary | ICD-10-CM | POA: Diagnosis not present

## 2021-08-11 DIAGNOSIS — Z720 Tobacco use: Secondary | ICD-10-CM

## 2021-08-11 NOTE — Patient Instructions (Signed)
Call the [facility] quit line What is the 1-800-quit-now for smoking? (956) [phone] 1-800-QUIT-NOW (307) 753-4753) can also connect you with one-on-one counselors and quit-smoking groups in your area, and help you find out if your health insurance covers quit services. I ordered a sound wave test of your leg to check for a blood clot.   I will call with results.   I will see if I know anybody in [place].

## 2021-08-12 ENCOUNTER — Encounter: Payer: Self-pay | Admitting: Family Medicine

## 2021-08-12 NOTE — Assessment & Plan Note (Signed)
Markedly improved 

## 2021-08-12 NOTE — Assessment & Plan Note (Signed)
Can proceed with PFTs at any time.  I will delay until after DVT ruled out.

## 2021-08-12 NOTE — Progress Notes (Signed)
    SUBJECTIVE:   CHIEF COMPLAINT / HPI:   Rt leg swelling and more. C/O right leg swelling.  States left leg does not swell.  Significant dependant edema per patient (better in morning, worse at end of day when she has been up on it.  Already trying elevation.  Some pain, no redness.  Then she states that she has trouble walking because the leg will go dead on her.  It seems she is describing numbness, not claudication.  Feels like it will give out but does not fall.  States it does limit her ability to exercise.  No recent LS spine films.  Does have LS degenerative changes seen on CT abd and pelvis 04/27/14  Stress remains high but may improve.  Family and living situation is big cause of stress.  There is a high likelihood that she will move in the next month.  The move will provide some distance from family.  This distance would reduce her stress considerably.  Cough is markedly improved from last visit.  Tobacco, has cut back considerably.  Could not afford patches and insurance would not cover.    OBJECTIVE:   BP 128/69   Pulse 76   Ht '5\' 7"'$  (1.702 m)   Wt 138 lb 12.8 oz (63 kg)   SpO2 100%   BMI 21.74 kg/m   Lungs clear, no wheeze Cardiac RRR without m or g Right let 1-2+ edema.  No skin changes.  Decreased but present pedal pulses, Skin is warm with good cap refill.  ASSESSMENT/PLAN:   Right leg swelling With unilateral swelling, feel the first step is to R/O DVT.  The numbness she complains of is likely a radiculopathy.  Will investigate further once I am sure no DVT.  Venous dopplers ordered.    Tobacco abuse Nice decrease in usage.  Given [facility] quit line for both counseling and free meds.    Cough Markedly improved.  COPD suggested by initial evaluation [facility]) Can proceed with PFTs at any time.  I will delay until after DVT ruled out.     Zenia Resides, MD [place]

## 2021-08-12 NOTE — Assessment & Plan Note (Signed)
With unilateral swelling, feel the first step is to R/O DVT.  The numbness she complains of is likely a radiculopathy.  Will investigate further once I am sure no DVT.  Venous dopplers ordered.

## 2021-08-12 NOTE — Assessment & Plan Note (Signed)
Nice decrease in usage.  Given [place] quit line for both counseling and free meds.

## 2021-08-15 ENCOUNTER — Telehealth: Payer: Self-pay | Admitting: *Deleted

## 2021-08-15 NOTE — Telephone Encounter (Signed)
LVM for pt to return call.  Calling to be sure that she is aware of her appointment time for the doppler Dr. Andria Frames ordered.  It is scheduled on 08/18/2021 '@10am'$  at [facility] [place]. The number to reach them is [phone] if there is any kind of scheduling conflict.  I believe pt already knows this information because I think they called her to schedule.  Rivers Gassmann Zimmerman Rumple, CMA

## 2021-08-18 ENCOUNTER — Ambulatory Visit (HOSPITAL_COMMUNITY)
Admission: RE | Admit: 2021-08-18 | Discharge: 2021-08-18 | Disposition: A | Discharge status: Home / Self Care | Payer: Medicare HMO | Source: Ambulatory Visit | Attending: Vascular Surgery | Admitting: Vascular Surgery

## 2021-08-18 ENCOUNTER — Telehealth: Payer: Self-pay | Admitting: Family Medicine

## 2021-08-18 ENCOUNTER — Telehealth: Payer: Self-pay | Admitting: *Deleted

## 2021-08-18 DIAGNOSIS — I82811 Embolism and thrombosis of superficial veins of right lower extremities: Secondary | ICD-10-CM | POA: Insufficient documentation

## 2021-08-18 DIAGNOSIS — M7989 Other specified soft tissue disorders: Secondary | ICD-10-CM

## 2021-08-18 NOTE — Telephone Encounter (Signed)
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  Rachel Bryan

## 2021-08-18 NOTE — Telephone Encounter (Signed)
LM for patient letting her know that I have printed her compression stocking order and placed it up front for pickup along with the name and address of the medical supply store.  Dacey Milberger,CMA

## 2021-08-18 NOTE — Telephone Encounter (Signed)
Noted and I have discussed with Ms. Mittelstaedt.

## 2021-08-18 NOTE — Telephone Encounter (Signed)
Called and discussed superficial chronic blood clot.  Stay on aspirin.  Elevation.  She is also willing to try compression stockings.

## 2021-09-08 ENCOUNTER — Telehealth: Payer: Self-pay

## 2021-09-08 DIAGNOSIS — R519 Headache, unspecified: Secondary | ICD-10-CM

## 2021-09-08 MED ORDER — NAPROXEN 375 MG PO TBEC: 375.0000 mg | DELAYED_RELEASE_TABLET | Freq: Three times a day (TID) | ORAL | 0 refills | Status: DC | PRN

## 2021-09-08 NOTE — Telephone Encounter (Signed)
Called.  Headache likely related to stress.  Patient has no money for OTC meds but can get prescription meds.  Reviewed creat.  OK for naproxen.

## 2021-09-08 NOTE — Telephone Encounter (Signed)
Patient calls nurse line requesting medication for headaches.   Patient reports she is under a lot of stress right now with moving. Patient reports symptoms started yesterday on the left side of her head, above her eyebrow.   Patient reports she took (2) Aspirin '81mg'$ s yesterday and symptoms "eased" up. Patient reports this is all she has. Patient can not afford OTC medications at this time.   Patient denies any vision changes, dizziness or confusion.   Patient advised to drink plenty of fluids.   Will forward to PCP.

## 2021-09-15 ENCOUNTER — Ambulatory Visit: Payer: Medicare HMO | Admitting: Family Medicine

## 2021-09-29 ENCOUNTER — Ambulatory Visit: Payer: Medicare HMO | Admitting: Family Medicine

## 2021-10-04 ENCOUNTER — Other Ambulatory Visit: Payer: Self-pay | Admitting: Family Medicine

## 2021-10-04 DIAGNOSIS — F32A Depression, unspecified: Secondary | ICD-10-CM

## 2021-10-05 ENCOUNTER — Ambulatory Visit: Payer: Medicare HMO | Admitting: Family Medicine

## 2021-10-17 ENCOUNTER — Ambulatory Visit: Payer: Medicare HMO | Admitting: Family Medicine

## 2021-10-28 ENCOUNTER — Encounter: Payer: Self-pay | Admitting: Family Medicine

## 2021-11-14 ENCOUNTER — Encounter: Payer: Self-pay | Admitting: Family Medicine

## 2021-11-14 ENCOUNTER — Ambulatory Visit (INDEPENDENT_AMBULATORY_CARE_PROVIDER_SITE_OTHER): Payer: 59 | Admitting: Family Medicine

## 2021-11-14 VITALS — BP 136/72 | HR 79 | Wt 138.8 lb

## 2021-11-14 DIAGNOSIS — R531 Weakness: Secondary | ICD-10-CM | POA: Diagnosis not present

## 2021-11-14 DIAGNOSIS — R251 Tremor, unspecified: Secondary | ICD-10-CM | POA: Diagnosis not present

## 2021-11-14 DIAGNOSIS — G4762 Sleep related leg cramps: Secondary | ICD-10-CM | POA: Diagnosis not present

## 2021-11-14 DIAGNOSIS — Z23 Encounter for immunization: Secondary | ICD-10-CM

## 2021-11-14 DIAGNOSIS — M7062 Trochanteric bursitis, left hip: Secondary | ICD-10-CM

## 2021-11-14 DIAGNOSIS — N3941 Urge incontinence: Secondary | ICD-10-CM

## 2021-11-14 MED ORDER — MIRABEGRON ER 50 MG PO TB24: 50.0000 mg | ORAL_TABLET | Freq: Every day | ORAL | 2 refills | Status: AC

## 2021-11-14 NOTE — Progress Notes (Signed)
[place]

## 2021-11-14 NOTE — Patient Instructions (Signed)
I sent a new prescription for a higher dose of the bladder medication.  You can use up your current supply by taking two pills daily. I will call with the blood test results.  I hope you get a good response from the cortisone shot for your bursitis.  Tell me how you are feeling when I call with the blood tests. I am not sure what is causing your hands to draw.  I likely will prescribe a medication after I see the blood test results.

## 2021-11-14 NOTE — Assessment & Plan Note (Signed)
Likely brought on by fall.  We will see how she responds to the steroid injection.

## 2021-11-14 NOTE — Assessment & Plan Note (Signed)
Unclear how much of pain and weakness complaints to pursue.  Clearly stress may be playing a role.  Check TSH, sed rate and CK.  Consider a trial of low dose gabapentin.

## 2021-11-14 NOTE — Assessment & Plan Note (Signed)
Try on higher dose.

## 2021-11-14 NOTE — Progress Notes (Signed)
    SUBJECTIVE:   CHIEF COMPLAINT / HPI:   Multiple issues: Mechanical fall several weeks ago.  Missed and step and fell on her left side.  C/O continued left hip pain Perhaps related, C/O both hands "drawing", left hand tremor and both legs cramping.  And she adds muscles weakness.  In her mind, all these things go together.  States her back is "fine."  No numbness Urinary incontinence. Myrbetric not helping at current dose.   Wants flu shot today. High stress.  Still in home, unable to move due to finances.      OBJECTIVE:   BP 136/72 (BP Location: Right Arm)   Pulse 79   Wt 138 lb 12.8 oz (63 kg)   SpO2 100%   BMI 21.74 kg/m   VS noted. Lungs clear Cardiac RRR without m or g Ext Good int and ext rotation both hips.  Exquisite tenderness left greater trochanter. Hands neg for carpal tunnel testing.  Informed consent:  Injected left greater trochanter with 3 cc of xylocaine and 40 mg depo medrol.  Patient tolerated procedure well.    ASSESSMENT/PLAN:   No problem-specific Assessment & Plan notes found for this encounter.     Zenia Resides, MD [place]

## 2021-11-14 NOTE — Assessment & Plan Note (Signed)
Left hand only.  I did not observe a tremor.  Perhaps related to weakness.

## 2021-11-15 ENCOUNTER — Encounter: Payer: Self-pay | Admitting: Family Medicine

## 2021-11-15 LAB — CK: Total CK: 56 U/L (ref 32–182)

## 2021-11-15 LAB — TSH: TSH: 1.89 u[IU]/mL (ref 0.450–4.500)

## 2021-11-15 LAB — SEDIMENTATION RATE: Sed Rate: 13 mm/hr (ref 0–40)

## 2021-11-15 MED ORDER — GABAPENTIN 100 MG PO CAPS: 100.0000 mg | ORAL_CAPSULE | Freq: Two times a day (BID) | ORAL | 3 refills | Status: AC

## 2021-11-15 NOTE — Addendum Note (Signed)
Addended by: Zenia Resides on: 11/15/2021 04:53 PM   Modules accepted: Orders

## 2021-11-17 ENCOUNTER — Encounter: Payer: Self-pay | Admitting: Family Medicine

## 2021-11-18 ENCOUNTER — Telehealth: Payer: Self-pay | Admitting: Family Medicine

## 2021-11-18 NOTE — Telephone Encounter (Signed)
Attempted to call Rachel Bryan, unable to leave VM. Will respond in pt message.

## 2021-11-27 ENCOUNTER — Encounter: Payer: Self-pay | Admitting: Family Medicine

## 2021-12-02 ENCOUNTER — Ambulatory Visit: Payer: Medicaid Other | Admitting: Family Medicine

## 2021-12-02 NOTE — Patient Instructions (Incomplete)
It was wonderful to meet you today. Thank you for allowing me to be a part of your care. Below is a short summary of what we discussed at your visit today:  Muscle cramps ***  *** ***  Health Maintenance We like to think about ways to keep you healthy for years to come. Below are some interventions and screenings we can offer to keep you healthy: - Shingles vaccine at your pharmacy - DEXA scan to screen for osteoporosis (bone thinning) - Medicare annual wellness - TDaP vaccine - COVID vaccine  Please bring all of your medications to every appointment!  If you have any questions or concerns, please do not hesitate to contact us via phone or MyChart message.   Ezequiel Essex, MD

## 2021-12-02 NOTE — Progress Notes (Deleted)
    SUBJECTIVE:   CHIEF COMPLAINT / HPI:   *** Doctor [name] please tell me what to do these muscle cramps are gettin awful an painful.Plus I still having the drawin in my hands .Please tell me what to do.I cannot cook or try to eat because times I cannot pick up fork or spoon.Please tell me what can I do.   Hi Ms Whack,   This is Dr Thompson Grayer, I am covering for Dr Andria Frames this week. It looks like he sent a new medication to your pharmacy gabapentin '100mg'$  twice daily for you to start for the cramps. I would recommend starting this if you have not already.  If you have started this and it is not helping I would recommend making an appt to discuss next steps, our clinic number is [phone].  Thanks, Dr Thompson Grayer  11/14/2021 Perhaps related, C/O both hands "drawing", left hand tremor and both legs cramping.  And she adds muscles weakness.  In her mind, all these things go together.  States her back is "fine."  No numbness  Unclear how much of pain and weakness complaints to pursue.  Clearly stress may be playing a role.  Check TSH, sed rate and CK.  Consider a trial of low dose gabapentin.    PERTINENT  PMH / PSH: ***  OBJECTIVE:   There were no vitals taken for this visit.  ***  ASSESSMENT/PLAN:   No problem-specific Assessment & Plan notes found for this encounter.     Ezequiel Essex, MD [place]

## 2021-12-05 ENCOUNTER — Ambulatory Visit: Payer: Medicaid Other | Admitting: Family Medicine

## 2021-12-05 NOTE — Patient Instructions (Incomplete)
It was nice seeing you today!  Blood work today.  See me in 3 months or whenever is a good for you.  Stay well, Shanequia Kendrick, MD [facility] [phone]  --  Make sure to check out at the front desk before you leave today.  Please arrive at least 15 minutes prior to your scheduled appointments.  If you had blood work today, I will send you a MyChart message or a letter if results are normal. Otherwise, I will give you a call.  If you had a referral placed, they will call you to set up an appointment. Please give us a call if you don't hear back in the next 2 weeks.  If you need additional refills before your next appointment, please call your pharmacy first.  

## 2021-12-05 NOTE — Progress Notes (Deleted)
    SUBJECTIVE:   CHIEF COMPLAINT / HPI:  No chief complaint on file.   Patient recently seen by his PCP about 3 weeks ago for muscle cramps, started on gabapentin 100 mg twice daily.  Labs check CK, TSH, ESR which were unremarkable.  PERTINENT  PMH / PSH: COPD, anxiety and depression  Patient Care Team: Zenia Resides, MD as PCP - General (Family Medicine)   OBJECTIVE:   There were no vitals taken for this visit.  Physical Exam      11/14/2021    9:48 AM  Depression screen PHQ 2/9  Decreased Interest 0  Down, Depressed, Hopeless 1  PHQ - 2 Score 1  Altered sleeping 2  Tired, decreased energy 1  Change in appetite 0  Feeling bad or failure about yourself  0  Trouble concentrating 2  Moving slowly or fidgety/restless 2  Suicidal thoughts 0  PHQ-9 Score 8  Difficult doing work/chores Somewhat difficult     {Show previous vital signs (optional):23777}  {Labs  Heme  Chem  Endocrine  Serology  Results Review (optional):23779}  ASSESSMENT/PLAN:   No problem-specific Assessment & Plan notes found for this encounter.    No follow-ups on file.   Zola Button, MD [place]

## 2021-12-12 ENCOUNTER — Ambulatory Visit: Payer: Medicaid Other | Admitting: Family Medicine

## 2022-01-31 ENCOUNTER — Other Ambulatory Visit: Payer: Self-pay | Admitting: Family Medicine

## 2022-01-31 DIAGNOSIS — G4700 Insomnia, unspecified: Secondary | ICD-10-CM

## 2022-02-28 ENCOUNTER — Encounter: Payer: Self-pay | Admitting: Family Medicine

## 2022-04-28 ENCOUNTER — Ambulatory Visit: Payer: 59 | Admitting: Family Medicine

## 2022-05-04 ENCOUNTER — Ambulatory Visit: Payer: 59 | Admitting: Family Medicine

## 2022-05-04 NOTE — Progress Notes (Deleted)
    SUBJECTIVE:   CHIEF COMPLAINT / HPI: re-establish care  Lung nodules on LDCT Scattered nodules with enlarged lymph node on CT. Set up with appt but did not follow up ***.  Insomnia On triazolam by previous PCP at [facility].    PERTINENT  PMH / PSH: Left trochanteric bursitis, urinary incontinence, cluster headache, anxiety, 53 pack year smoking history  OBJECTIVE:   There were no vitals taken for this visit.  General: Alert and oriented, in NAD Skin: Warm, dry, and intact without lesions HEENT: NCAT, EOM grossly normal, midline nasal septum Cardiac: RRR, no m/r/g appreciated Respiratory: CTAB, breathing and speaking comfortably on RA Abdominal: Soft, nontender, nondistended, normoactive bowel sounds Extremities: Moves all extremities grossly equally Neurological: No gross focal deficit Psychiatric: Appropriate mood and affect   ASSESSMENT/PLAN:   No problem-specific Assessment & Plan notes found for this encounter.   Health maintenance ***  Janeal Holmes, MD [facility]

## 2022-07-06 ENCOUNTER — Encounter: Payer: Self-pay | Admitting: Optometry

## 2022-08-08 DIAGNOSIS — I2510 Atherosclerotic heart disease of native coronary artery without angina pectoris: Secondary | ICD-10-CM | POA: Diagnosis present

## 2022-08-23 ENCOUNTER — Other Ambulatory Visit (HOSPITAL_COMMUNITY): Payer: Self-pay | Admitting: Family Medicine

## 2022-08-23 ENCOUNTER — Other Ambulatory Visit: Payer: Self-pay | Admitting: Family Medicine

## 2022-08-23 DIAGNOSIS — I209 Angina pectoris, unspecified: Secondary | ICD-10-CM

## 2022-08-23 DIAGNOSIS — Z1231 Encounter for screening mammogram for malignant neoplasm of breast: Secondary | ICD-10-CM

## 2022-08-25 ENCOUNTER — Other Ambulatory Visit: Payer: Self-pay | Admitting: Family Medicine

## 2022-08-25 DIAGNOSIS — Z780 Asymptomatic menopausal state: Secondary | ICD-10-CM

## 2022-08-30 ENCOUNTER — Ambulatory Visit: Payer: 59

## 2022-09-05 ENCOUNTER — Ambulatory Visit (HOSPITAL_COMMUNITY): Admission: RE | Admit: 2022-09-05 | Payer: 59 | Source: Ambulatory Visit

## 2022-09-05 ENCOUNTER — Encounter (HOSPITAL_COMMUNITY): Payer: Self-pay

## 2022-09-11 ENCOUNTER — Other Ambulatory Visit (HOSPITAL_COMMUNITY): Payer: Self-pay | Admitting: Cardiology

## 2022-09-11 DIAGNOSIS — I2510 Atherosclerotic heart disease of native coronary artery without angina pectoris: Secondary | ICD-10-CM

## 2022-09-11 DIAGNOSIS — E7800 Pure hypercholesterolemia, unspecified: Secondary | ICD-10-CM

## 2022-09-11 DIAGNOSIS — R42 Dizziness and giddiness: Secondary | ICD-10-CM

## 2022-09-11 DIAGNOSIS — R002 Palpitations: Secondary | ICD-10-CM

## 2022-09-11 DIAGNOSIS — E7849 Other hyperlipidemia: Secondary | ICD-10-CM

## 2022-09-11 DIAGNOSIS — R0609 Other forms of dyspnea: Secondary | ICD-10-CM

## 2022-09-12 ENCOUNTER — Ambulatory Visit: Payer: 59

## 2022-09-13 ENCOUNTER — Telehealth (HOSPITAL_COMMUNITY): Payer: Self-pay | Admitting: *Deleted

## 2022-09-13 MED ORDER — METOPROLOL TARTRATE 50 MG PO TABS: ORAL_TABLET | ORAL | 0 refills | Status: DC

## 2022-09-13 NOTE — Telephone Encounter (Signed)
Reaching out to patient to offer assistance regarding upcoming cardiac imaging study; pt verbalizes understanding of appt date/time, parking situation and where to check in, pre-test NPO status and medications ordered, and verified current allergies; name and call back number provided for further questions should they arise Johney Frame RN Navigator Cardiac Imaging [facility] [phone] office [phone] cell   Instructions sent to email.  50mg  metoprolol sent to preferred pharmacy to take 2 hours prior to CT.

## 2022-09-14 ENCOUNTER — Ambulatory Visit (HOSPITAL_COMMUNITY): Payer: 59

## 2022-09-19 ENCOUNTER — Telehealth (HOSPITAL_COMMUNITY): Payer: Self-pay | Admitting: *Deleted

## 2022-09-19 NOTE — Telephone Encounter (Signed)
Attempted to call patient regarding upcoming cardiac CT appointment. Left message on voicemail with name and callback number Hayley Sharpe RN Navigator Cardiac Imaging [facility] [phone] Office   

## 2022-09-19 NOTE — Telephone Encounter (Signed)
Reaching out to patient to offer assistance regarding upcoming cardiac imaging study; pt verbalizes understanding of appt date/time, parking situation and where to check in, pre-test NPO status and medications ordered, and verified current allergies; name and call back number provided for further questions should they arise Johney Frame RN Navigator Cardiac Imaging [facility] [phone] office [phone] cell   Instructions resent via email per patient request.

## 2022-09-20 ENCOUNTER — Ambulatory Visit (HOSPITAL_COMMUNITY)
Admission: RE | Admit: 2022-09-20 | Discharge: 2022-09-20 | Disposition: A | Discharge status: Home / Self Care | Payer: 59 | Source: Ambulatory Visit | Attending: Cardiology | Admitting: Cardiology

## 2022-09-20 DIAGNOSIS — E7849 Other hyperlipidemia: Secondary | ICD-10-CM | POA: Insufficient documentation

## 2022-09-20 DIAGNOSIS — E7800 Pure hypercholesterolemia, unspecified: Secondary | ICD-10-CM | POA: Insufficient documentation

## 2022-09-20 DIAGNOSIS — I2584 Coronary atherosclerosis due to calcified coronary lesion: Secondary | ICD-10-CM | POA: Insufficient documentation

## 2022-09-20 DIAGNOSIS — R42 Dizziness and giddiness: Secondary | ICD-10-CM | POA: Diagnosis present

## 2022-09-20 DIAGNOSIS — R0609 Other forms of dyspnea: Secondary | ICD-10-CM | POA: Diagnosis present

## 2022-09-20 DIAGNOSIS — R002 Palpitations: Secondary | ICD-10-CM | POA: Insufficient documentation

## 2022-09-20 DIAGNOSIS — I2510 Atherosclerotic heart disease of native coronary artery without angina pectoris: Secondary | ICD-10-CM | POA: Insufficient documentation

## 2022-09-20 MED ORDER — NITROGLYCERIN 0.4 MG SL SUBL
SUBLINGUAL_TABLET | SUBLINGUAL | Status: AC
  Filled 2022-09-20: qty 2

## 2022-09-20 MED ORDER — NITROGLYCERIN 0.4 MG SL SUBL
0.8000 mg | SUBLINGUAL_TABLET | Freq: Once | SUBLINGUAL | Status: AC
  Administered 2022-09-20: 0.8 mg via SUBLINGUAL

## 2022-09-20 MED ORDER — IOHEXOL 350 MG/ML SOLN
95.0000 mL | Freq: Once | INTRAVENOUS | Status: AC | PRN
  Administered 2022-09-20: 95 mL via INTRAVENOUS

## 2022-12-25 ENCOUNTER — Other Ambulatory Visit: Payer: Self-pay | Admitting: Family Medicine

## 2022-12-25 DIAGNOSIS — G5790 Unspecified mononeuropathy of unspecified lower limb: Secondary | ICD-10-CM

## 2023-01-13 ENCOUNTER — Other Ambulatory Visit: Payer: 59

## 2023-03-19 ENCOUNTER — Inpatient Hospital Stay (HOSPITAL_COMMUNITY): Payer: 59

## 2023-03-19 ENCOUNTER — Inpatient Hospital Stay (HOSPITAL_COMMUNITY)
Admission: EM | Admit: 2023-03-19 | Discharge: 2023-03-22 | DRG: 390 | Disposition: A | Discharge status: Home / Self Care | Payer: 59 | Attending: Internal Medicine | Admitting: Internal Medicine

## 2023-03-19 ENCOUNTER — Emergency Department (HOSPITAL_COMMUNITY): Payer: 59

## 2023-03-19 ENCOUNTER — Encounter (HOSPITAL_COMMUNITY): Payer: Self-pay | Admitting: *Deleted

## 2023-03-19 ENCOUNTER — Other Ambulatory Visit: Payer: Self-pay

## 2023-03-19 DIAGNOSIS — K8020 Calculus of gallbladder without cholecystitis without obstruction: Secondary | ICD-10-CM | POA: Diagnosis present

## 2023-03-19 DIAGNOSIS — K565 Intestinal adhesions [bands], unspecified as to partial versus complete obstruction: Principal | ICD-10-CM | POA: Diagnosis present

## 2023-03-19 DIAGNOSIS — Z79899 Other long term (current) drug therapy: Secondary | ICD-10-CM

## 2023-03-19 DIAGNOSIS — Z882 Allergy status to sulfonamides status: Secondary | ICD-10-CM | POA: Diagnosis not present

## 2023-03-19 DIAGNOSIS — F419 Anxiety disorder, unspecified: Secondary | ICD-10-CM | POA: Diagnosis present

## 2023-03-19 DIAGNOSIS — Z806 Family history of leukemia: Secondary | ICD-10-CM

## 2023-03-19 DIAGNOSIS — K56609 Unspecified intestinal obstruction, unspecified as to partial versus complete obstruction: Secondary | ICD-10-CM | POA: Diagnosis not present

## 2023-03-19 DIAGNOSIS — F32A Depression, unspecified: Secondary | ICD-10-CM | POA: Diagnosis present

## 2023-03-19 DIAGNOSIS — Z881 Allergy status to other antibiotic agents status: Secondary | ICD-10-CM | POA: Diagnosis not present

## 2023-03-19 DIAGNOSIS — Z880 Allergy status to penicillin: Secondary | ICD-10-CM

## 2023-03-19 DIAGNOSIS — I2510 Atherosclerotic heart disease of native coronary artery without angina pectoris: Secondary | ICD-10-CM | POA: Diagnosis present

## 2023-03-19 DIAGNOSIS — E7800 Pure hypercholesterolemia, unspecified: Secondary | ICD-10-CM | POA: Diagnosis present

## 2023-03-19 DIAGNOSIS — E279 Disorder of adrenal gland, unspecified: Secondary | ICD-10-CM

## 2023-03-19 DIAGNOSIS — Z8249 Family history of ischemic heart disease and other diseases of the circulatory system: Secondary | ICD-10-CM | POA: Diagnosis not present

## 2023-03-19 DIAGNOSIS — F17210 Nicotine dependence, cigarettes, uncomplicated: Secondary | ICD-10-CM | POA: Diagnosis present

## 2023-03-19 DIAGNOSIS — E278 Other specified disorders of adrenal gland: Secondary | ICD-10-CM | POA: Diagnosis present

## 2023-03-19 DIAGNOSIS — E785 Hyperlipidemia, unspecified: Secondary | ICD-10-CM | POA: Diagnosis present

## 2023-03-19 DIAGNOSIS — Z885 Allergy status to narcotic agent status: Secondary | ICD-10-CM | POA: Diagnosis not present

## 2023-03-19 DIAGNOSIS — R1084 Generalized abdominal pain: Principal | ICD-10-CM

## 2023-03-19 DIAGNOSIS — K7689 Other specified diseases of liver: Secondary | ICD-10-CM | POA: Diagnosis present

## 2023-03-19 DIAGNOSIS — Z90710 Acquired absence of both cervix and uterus: Secondary | ICD-10-CM | POA: Diagnosis not present

## 2023-03-19 DIAGNOSIS — R109 Unspecified abdominal pain: Secondary | ICD-10-CM | POA: Diagnosis present

## 2023-03-19 DIAGNOSIS — R42 Dizziness and giddiness: Secondary | ICD-10-CM

## 2023-03-19 DIAGNOSIS — Z9049 Acquired absence of other specified parts of digestive tract: Secondary | ICD-10-CM

## 2023-03-19 DIAGNOSIS — Z720 Tobacco use: Secondary | ICD-10-CM | POA: Diagnosis present

## 2023-03-19 DIAGNOSIS — J449 Chronic obstructive pulmonary disease, unspecified: Secondary | ICD-10-CM | POA: Diagnosis present

## 2023-03-19 DIAGNOSIS — Z818 Family history of other mental and behavioral disorders: Secondary | ICD-10-CM | POA: Diagnosis not present

## 2023-03-19 DIAGNOSIS — Z7982 Long term (current) use of aspirin: Secondary | ICD-10-CM | POA: Diagnosis not present

## 2023-03-19 DIAGNOSIS — Z810 Family history of intellectual disabilities: Secondary | ICD-10-CM | POA: Diagnosis not present

## 2023-03-19 LAB — COMPREHENSIVE METABOLIC PANEL
ALT: 11 U/L (ref 0–44)
AST: 17 U/L (ref 15–41)
Albumin: 3.7 g/dL (ref 3.5–5.0)
Alkaline Phosphatase: 57 U/L (ref 38–126)
Anion gap: 9 (ref 5–15)
BUN: 21 mg/dL (ref 8–23)
CO2: 24 mmol/L (ref 22–32)
Calcium: 9.6 mg/dL (ref 8.9–10.3)
Chloride: 107 mmol/L (ref 98–111)
Creatinine, Ser: 0.75 mg/dL (ref 0.44–1.00)
GFR, Estimated: >60 mL/min (ref >60)
Glucose, Bld: 117 mg/dL — ABNORMAL HIGH (ref 70–99)
Potassium: 4 mmol/L (ref 3.5–5.1)
Sodium: 140 mmol/L (ref 135–145)
Total Bilirubin: 0.5 mg/dL (ref 0.0–1.2)
Total Protein: 6.9 g/dL (ref 6.5–8.1)

## 2023-03-19 LAB — CBC
HCT: 40.1 % (ref 36.0–46.0)
Hemoglobin: 12.7 g/dL (ref 12.0–15.0)
MCH: 28.5 pg (ref 26.0–34.0)
MCHC: 31.7 g/dL (ref 30.0–36.0)
MCV: 90.1 fL (ref 80.0–100.0)
Platelets: 266 10*3/uL (ref 150–400)
RBC: 4.45 MIL/uL (ref 3.87–5.11)
RDW: 12.8 % (ref 11.5–15.5)
WBC: 10 10*3/uL (ref 4.0–10.5)
nRBC: 0 % (ref 0.0–0.2)

## 2023-03-19 LAB — LIPASE, BLOOD: Lipase: 33 U/L (ref 11–51)

## 2023-03-19 MED ORDER — ACETAMINOPHEN 650 MG RE SUPP: 650.0000 mg | Freq: Four times a day (QID) | RECTAL | Status: DC | PRN

## 2023-03-19 MED ORDER — ENOXAPARIN SODIUM 40 MG/0.4ML IJ SOSY
40.0000 mg | PREFILLED_SYRINGE | Freq: Every day | INTRAMUSCULAR | Status: DC
  Administered 2023-03-19 – 2023-03-21 (×3): 40 mg via SUBCUTANEOUS
  Filled 2023-03-19 (×3): qty 0.4

## 2023-03-19 MED ORDER — ACETAMINOPHEN 325 MG PO TABS
650.0000 mg | ORAL_TABLET | Freq: Four times a day (QID) | ORAL | Status: DC | PRN
  Administered 2023-03-20 – 2023-03-22 (×3): 650 mg via ORAL
  Filled 2023-03-19 (×3): qty 2

## 2023-03-19 MED ORDER — ONDANSETRON HCL 4 MG PO TABS
4.0000 mg | ORAL_TABLET | Freq: Four times a day (QID) | ORAL | Status: DC | PRN
Start: 2023-03-19 — End: 2023-03-22

## 2023-03-19 MED ORDER — HYDROMORPHONE HCL 1 MG/ML IJ SOLN
0.5000 mg | Freq: Once | INTRAMUSCULAR | Status: AC
  Administered 2023-03-19: 0.5 mg via INTRAVENOUS
  Filled 2023-03-19: qty 1

## 2023-03-19 MED ORDER — HYDROMORPHONE HCL 1 MG/ML IJ SOLN
0.5000 mg | INTRAMUSCULAR | Status: DC | PRN
  Administered 2023-03-19 – 2023-03-20 (×2): 0.5 mg via INTRAVENOUS
  Filled 2023-03-19 (×2): qty 0.5

## 2023-03-19 MED ORDER — ONDANSETRON HCL 4 MG/2ML IJ SOLN: 4.0000 mg | Freq: Once | INTRAMUSCULAR | Status: DC

## 2023-03-19 MED ORDER — ONDANSETRON HCL 4 MG/2ML IJ SOLN
4.0000 mg | Freq: Once | INTRAMUSCULAR | Status: AC
  Administered 2023-03-19: 4 mg via INTRAVENOUS
  Filled 2023-03-19: qty 2

## 2023-03-19 MED ORDER — NICOTINE 7 MG/24HR TD PT24
7.0000 mg | MEDICATED_PATCH | Freq: Every day | TRANSDERMAL | Status: DC
  Administered 2023-03-19 – 2023-03-22 (×4): 7 mg via TRANSDERMAL
  Filled 2023-03-19 (×4): qty 1

## 2023-03-19 MED ORDER — KCL IN DEXTROSE-NACL 20-5-0.9 MEQ/L-%-% IV SOLN
INTRAVENOUS | Status: AC
  Filled 2023-03-19 (×2): qty 1000

## 2023-03-19 MED ORDER — FENTANYL CITRATE PF 50 MCG/ML IJ SOSY
25.0000 ug | PREFILLED_SYRINGE | Freq: Once | INTRAMUSCULAR | Status: AC
  Administered 2023-03-19: 25 ug via INTRAVENOUS
  Filled 2023-03-19: qty 1

## 2023-03-19 MED ORDER — IOHEXOL 300 MG/ML  SOLN
100.0000 mL | Freq: Once | INTRAMUSCULAR | Status: AC | PRN
  Administered 2023-03-19: 100 mL via INTRAVENOUS

## 2023-03-19 MED ORDER — LACTATED RINGERS IV BOLUS
1000.0000 mL | Freq: Once | INTRAVENOUS | Status: AC
  Administered 2023-03-19: 1000 mL via INTRAVENOUS

## 2023-03-19 MED ORDER — PANTOPRAZOLE SODIUM 40 MG IV SOLR
40.0000 mg | Freq: Once | INTRAVENOUS | Status: AC
  Administered 2023-03-19: 40 mg via INTRAVENOUS
  Filled 2023-03-19: qty 10

## 2023-03-19 MED ORDER — ONDANSETRON HCL 4 MG/2ML IJ SOLN
4.0000 mg | Freq: Four times a day (QID) | INTRAMUSCULAR | Status: DC | PRN
  Administered 2023-03-20: 4 mg via INTRAVENOUS
  Filled 2023-03-19: qty 2

## 2023-03-19 NOTE — H&P (Signed)
 History and Physical    Patient: Rachel Bryan UJW:[id number] DOB: 12-31-1950 DOA: 03/19/2023 DOS: the patient was seen and examined on 03/19/2023 PCP: Sharmon Revere, MD  Patient coming from: Home  Chief Complaint:  Chief Complaint  Patient presents with   Abdominal Pain   HPI: Rachel Bryan is a 73 y.o. 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  No diarrhea, melena or hematochezia. No flank pain, dysuria, frequency or hematuria. She denied fever, chills, rhinorrhea, sore throat, wheezing or hemoptysis. No chest pain, palpitations, diaphoresis, PND, orthopnea or pitting edema of the lower extremities. No polyuria, polydipsia, polyphagia or blurred vision.   Lab work: CBC, lipase and CMP were normal with the EpiCept showing of a glucose of 117 mg/dL.  Imaging: CT abdomen/pelvis with contrast showing high-grade SBO with transition point in the midline lower abdomen, associated with enteritis, as described in detail above.  No pneumatosis, pneumoperitoneum or portal venous gas.  No walled off abscess or loculated collection.  Bibasilar chronic interstitial lung disease, diffuse hepatic asteatosis, multiple liver cysts, stable nodularity of bilateral adrenal glands, aortic atherosclerosis.   ED course: Initial vital signs were temperature 98.2 F, pulse 83, respirations 16, BP 137/70 mmHg O2 sat 95% on room air.  The patient received fentanyl 25 mcg IVP, hydromorphone 0.5 mg IVP x 2, LR 1000 mL liter bolus, ondansetron 4 mg IVP and pantoprazole 40  mg IVP.  Review of Systems: As mentioned in the history of present illness. All other systems reviewed and are negative. Past Medical History:  Diagnosis Date   Abnormality of gait 06/05/2014   Anxiety    Cluster headache    Incontinence of urine    Insomnia secondary to anxiety    Lactose intolerance    Memory difficulties 06/05/2014   Past Surgical History:  Procedure Laterality Date   ABDOMINAL HYSTERECTOMY     APPENDECTOMY     Social History:  reports that she has been smoking cigarettes. She has never used smokeless tobacco. She reports that she does not drink alcohol and does not use drugs.  Allergies  Allergen Reactions   Levaquin [Levofloxacin] Other (See Comments)    Per pt report.  Burning veins    Penicillins Hives and Palpitations    Has patient had a PCN reaction causing immediate rash, facial/tongue/throat swelling, SOB or lightheadedness with hypotension: yes Has patient had a PCN reaction causing severe rash involving mucus membranes or skin necrosis: yes Has patient had a PCN reaction that required hospitalization: no Has patient had a PCN reaction occurring within the last 10 years: no If all of the above answers are "NO", then may proceed with Cephalosporin use.    Codeine Hives and Other (See Comments)    sweating   Demerol [Meperidine] Hives   Morphine And Codeine Other (See Comments)    Hallucinations    Sulfa Antibiotics Hives    Family History  Problem Relation Age of Onset   Transient ischemic attack Mother    Cancer Mother        lung (smoker)   Anxiety disorder Mother    Cancer Father        prostate   Hypertension  Father    Mental retardation Sister    Healthy Brother    Healthy Brother    Cancer Maternal Grandmother        leukemia    Prior to Admission medications   Medication Sig Start Date End Date Taking? Authorizing Provider  albuterol (VENTOLIN HFA) 108 (90 Base) MCG/ACT inhaler INHALE 2 PUFFS INTO THE LUNGS EVERY 6 HOURS AS  NEEDED FOR WHEEZING OR SHORTNESS OF BREATH 12/29/20   Moses Manners, MD  aspirin EC 81 MG tablet Take 1 tablet (81 mg total) by mouth daily. Swallow whole. 03/22/21   Moses Manners, MD  busPIRone (BUSPAR) 5 MG tablet TAKE 1 TABLET(5 MG) BY MOUTH THREE TIMES DAILY 10/04/21   Moses Manners, MD  gabapentin (NEURONTIN) 100 MG capsule Take 1 capsule (100 mg total) by mouth 2 (two) times daily. 11/15/21   Moses Manners, MD  meclizine (ANTIVERT) 12.5 MG tablet TAKE 1 TABLET(12.5 MG) BY MOUTH TWICE DAILY AS NEEDED FOR DIZZINESS 08/04/21   McDiarmid, Leighton Roach, MD  metoprolol tartrate (LOPRESSOR) 50 MG tablet Take 50 mg (1 tablet) TWO hours prior to CT scan 09/13/22   O'Neal, Ronnald Ramp, MD  mirabegron ER (MYRBETRIQ) 50 MG TB24 tablet Take 1 tablet (50 mg total) by mouth daily. 11/14/21   Moses Manners, MD  Naproxen 375 MG TBEC Take 1 tablet (375 mg total) by mouth every 8 (eight) hours as needed. For headache 09/08/21   Moses Manners, MD  nicotine (NICODERM CQ - DOSED IN MG/24 HOURS) 14 mg/24hr patch Place 1 patch (14 mg total) onto the skin daily. 07/29/21   Maury Dus, MD  nicotine polacrilex (NICORETTE) 2 MG gum Take 1 each (2 mg total) by mouth as needed for smoking cessation. 07/29/21   Maury Dus, MD  nitroGLYCERIN (NITROSTAT) 0.4 MG SL tablet Place 1 tablet (0.4 mg total) under the tongue every 5 (five) minutes as needed for chest pain. 11/10/20   Moses Manners, MD  rosuvastatin (CRESTOR) 40 MG tablet Take 1 tablet (40 mg total) by mouth daily. 11/11/20   Moses Manners, MD    Physical Exam: Vitals:   03/19/23 1036 03/19/23 1037 03/19/23 1459  BP: 137/70  135/60  Pulse: 83  74  Resp: 16  18  Temp: 98.2 F (36.8 C)  97.8 F (36.6 C)  TempSrc: Oral  Oral  SpO2: 95%  95%  Weight:  63 kg   Height:  5\' 7"  (1.702 m)    Physical Exam Vitals and nursing note reviewed.  Constitutional:      General: She is awake. She is not in acute distress.    Appearance: She is  well-developed and normal weight. She is ill-appearing.     Interventions: Nasal cannula in place.  HENT:     Head: Normocephalic.     Nose: No rhinorrhea.     Comments: Nasogastric tube in place.    Mouth/Throat:     Mouth: Mucous membranes are dry.  Eyes:     General: No scleral icterus.    Pupils: Pupils are equal, round, and reactive to light.  Neck:     Vascular: No JVD.  Cardiovascular:     Rate and Rhythm: Normal rate and regular rhythm.     Heart sounds: S1 normal and S2 normal.  Pulmonary:     Effort: Pulmonary effort is normal.     Breath sounds: Normal breath sounds.  Abdominal:     General: There is distension.  Palpations: Abdomen is soft.     Tenderness: There is abdominal tenderness. There is no right CVA tenderness, left CVA tenderness, guarding or rebound.     Hernia: No hernia is present.  Musculoskeletal:     Cervical back: Neck supple.     Right lower leg: No edema.     Left lower leg: No edema.  Skin:    General: Skin is warm and dry.  Neurological:     General: No focal deficit present.     Mental Status: She is alert and oriented to person, place, and time.  Psychiatric:        Mood and Affect: Mood normal.        Behavior: Behavior normal. Behavior is cooperative.     Data Reviewed:  Results are pending, will review when available.  Assessment and Plan: Principal Problem:   SBO (small bowel obstruction) (HCC) Inpatient/MedSurg. Keep NPO. Continue NTG at LIS. Continue IV fluids. Analgesics as needed. Antiemetics as needed. Pantoprazole 40 mg IVP every 24 hours. Keep electrolytes optimized. Follow-up CBC and CMP in AM. Follow-up imaging in the morning. General surgery input appreciated.  Active Problems:   Tobacco abuse Tobacco cessation advised. Nicotine replacement therapy ordered.    Anxiety and depression Resume buspirone once cleared for oral intake. Follow-up with PCP or behavioral health as an outpatient.     Hypercholesterolemia Resume rosuvastatin 40 mg p.o. daily once cleared for oral intake.    Coronary artery calcification Nonobstructive. Smoking cessation. Continue statin.    COPD (chronic obstructive pulmonary disease) (HCC) Albuterol MDI as needed. Smoking cessation.    Advance Care Planning:   Code Status: Full Code   Consults: CCS Chevis Pretty, MD).  Family Communication:   Severity of Illness: The appropriate patient status for this patient is INPATIENT. Inpatient status is judged to be reasonable and necessary in order to provide the required intensity of service to ensure the patient's safety. The patient's presenting symptoms, physical exam findings, and initial radiographic and laboratory data in the context of their chronic comorbidities is felt to place them at high risk for further clinical deterioration. Furthermore, it is not anticipated that the patient will be medically stable for discharge from the hospital within 2 midnights of admission.   * I certify that at the point of admission it is my clinical judgment that the patient will require inpatient hospital care spanning beyond 2 midnights from the point of admission due to high intensity of service, high risk for further deterioration and high frequency of surveillance required.*  Author: Bobette Mo, MD 03/19/2023 4:27 PM  For on call review [email or web address].   This document was prepared using Dragon voice recognition software and may contain some unintended transcription errors.

## 2023-03-19 NOTE — ED Provider Notes (Addendum)
 [facility] [facility] AT [facility] Provider Note   CSN: [id number] Arrival date & time: 03/19/23  1008     History  Chief Complaint  Patient presents with   Abdominal Pain    Rachel Bryan is a 73 y.o. female.  Pt with hx multiple prior/remote abd surgeries, hx sbo, c/o mid to upper abdominal pain in past two days, with episodes emesis (brownish), and indicates hasn't had a good/normal bm in 2-3 days. Did have small formed yellow bm this AM, and is passing small amounts gas. Denies back or flank pain. No dysuria or hematuria. No chest pain or discomfort. No sob.  Denies hx pud, pancreateitis or  gallstones (although prior imaging did show gallstones). No fever or chills.   The history is provided by the patient, medical records and the EMS personnel.  Abdominal Pain Associated symptoms: nausea and vomiting   Associated symptoms: no chest pain, no cough, no dysuria, no fever, no shortness of breath and no sore throat        Home Medications Prior to Admission medications   Medication Sig Start Date End Date Taking? Authorizing Provider  albuterol (VENTOLIN HFA) 108 (90 Base) MCG/ACT inhaler INHALE 2 PUFFS INTO THE LUNGS EVERY 6 HOURS AS NEEDED FOR WHEEZING OR SHORTNESS OF BREATH 12/29/20   Moses Manners, MD  aspirin EC 81 MG tablet Take 1 tablet (81 mg total) by mouth daily. Swallow whole. 03/22/21   Moses Manners, MD  busPIRone (BUSPAR) 5 MG tablet TAKE 1 TABLET(5 MG) BY MOUTH THREE TIMES DAILY 10/04/21   Moses Manners, MD  gabapentin (NEURONTIN) 100 MG capsule Take 1 capsule (100 mg total) by mouth 2 (two) times daily. 11/15/21   Moses Manners, MD  meclizine (ANTIVERT) 12.5 MG tablet TAKE 1 TABLET(12.5 MG) BY MOUTH TWICE DAILY AS NEEDED FOR DIZZINESS 08/04/21   McDiarmid, Leighton Roach, MD  metoprolol tartrate (LOPRESSOR) 50 MG tablet Take 50 mg (1 tablet) TWO hours prior to CT scan 09/13/22   O'Neal, Ronnald Ramp, MD  mirabegron ER (MYRBETRIQ) 50 MG  TB24 tablet Take 1 tablet (50 mg total) by mouth daily. 11/14/21   Moses Manners, MD  Naproxen 375 MG TBEC Take 1 tablet (375 mg total) by mouth every 8 (eight) hours as needed. For headache 09/08/21   Moses Manners, MD  nicotine (NICODERM CQ - DOSED IN MG/24 HOURS) 14 mg/24hr patch Place 1 patch (14 mg total) onto the skin daily. 07/29/21   Maury Dus, MD  nicotine polacrilex (NICORETTE) 2 MG gum Take 1 each (2 mg total) by mouth as needed for smoking cessation. 07/29/21   Maury Dus, MD  nitroGLYCERIN (NITROSTAT) 0.4 MG SL tablet Place 1 tablet (0.4 mg total) under the tongue every 5 (five) minutes as needed for chest pain. 11/10/20   Moses Manners, MD  rosuvastatin (CRESTOR) 40 MG tablet Take 1 tablet (40 mg total) by mouth daily. 11/11/20   Moses Manners, MD      Allergies    Levaquin [levofloxacin], Penicillins, Codeine, Demerol [meperidine], Morphine and codeine, and Sulfa antibiotics    Review of Systems   Review of Systems  Constitutional:  Negative for fever.  HENT:  Negative for sore throat.   Respiratory:  Negative for cough and shortness of breath.   Cardiovascular:  Negative for chest pain.  Gastrointestinal:  Positive for abdominal pain, nausea and vomiting.  Genitourinary:  Negative for dysuria and flank pain.  Musculoskeletal:  Negative  for back pain and neck pain.  Skin:  Negative for rash.  Neurological:  Negative for headaches.    Physical Exam Updated Vital Signs BP 135/60   Pulse 74   Temp 97.8 F (36.6 C) (Oral)   Resp 18   Ht 1.702 m (5\' 7" )   Wt 63 kg   SpO2 95%   BMI 21.75 kg/m  Physical Exam Vitals and nursing note reviewed.  Constitutional:      Appearance: Normal appearance. She is well-developed.  HENT:     Head: Atraumatic.     Nose: Nose normal.     Mouth/Throat:     Mouth: Mucous membranes are moist.  Eyes:     General: No scleral icterus.    Conjunctiva/sclera: Conjunctivae normal.  Neck:     Trachea: No tracheal  deviation.  Cardiovascular:     Rate and Rhythm: Normal rate and regular rhythm.     Pulses: Normal pulses.     Heart sounds: Normal heart sounds. No murmur heard.    No friction rub. No gallop.  Pulmonary:     Effort: Pulmonary effort is normal. No respiratory distress.     Breath sounds: Normal breath sounds.  Abdominal:     General: Bowel sounds are normal. There is no distension.     Palpations: Abdomen is soft.     Tenderness: There is abdominal tenderness. There is no guarding.     Comments: Mid abd and epigastric tenderness. No incarcerated hernia.   Genitourinary:    Comments: No cva tenderness.  Musculoskeletal:        General: No swelling or tenderness.     Cervical back: Normal range of motion and neck supple. No rigidity. No muscular tenderness.  Skin:    General: Skin is warm and dry.     Findings: No rash.  Neurological:     Mental Status: She is alert.     Comments: Alert, speech normal.   Psychiatric:        Mood and Affect: Mood normal.     ED Results / Procedures / Treatments   Labs (all labs ordered are listed, but only abnormal results are displayed) Results for orders placed or performed during the hospital encounter of 03/19/23  CBC   Collection Time: 03/19/23 10:45 AM  Result Value Ref Range   WBC 10.0 4.0 - 10.5 K/uL   RBC 4.45 3.87 - 5.11 MIL/uL   Hemoglobin 12.7 12.0 - 15.0 g/dL   HCT 16.1 09.6 - 04.5 %   MCV 90.1 80.0 - 100.0 fL   MCH 28.5 26.0 - 34.0 pg   MCHC 31.7 30.0 - 36.0 g/dL   RDW 40.9 81.1 - 91.4 %   Platelets 266 150 - 400 K/uL   nRBC 0.0 0.0 - 0.2 %  Comprehensive metabolic panel   Collection Time: 03/19/23 10:45 AM  Result Value Ref Range   Sodium 140 135 - 145 mmol/L   Potassium 4.0 3.5 - 5.1 mmol/L   Chloride 107 98 - 111 mmol/L   CO2 24 22 - 32 mmol/L   Glucose, Bld 117 (H) 70 - 99 mg/dL   BUN 21 8 - 23 mg/dL   Creatinine, Ser 7.82 0.44 - 1.00 mg/dL   Calcium 9.6 8.9 - 95.6 mg/dL   Total Protein 6.9 6.5 - 8.1 g/dL    Albumin 3.7 3.5 - 5.0 g/dL   AST 17 15 - 41 U/L   ALT 11 0 - 44 U/L   Alkaline Phosphatase  57 38 - 126 U/L   Total Bilirubin 0.5 0.0 - 1.2 mg/dL   GFR, Estimated >78 >29 mL/min   Anion gap 9 5 - 15  Lipase, blood   Collection Time: 03/19/23 10:45 AM  Result Value Ref Range   Lipase 33 11 - 51 U/L     EKG None  Radiology CT ABDOMEN PELVIS W CONTRAST Result Date: 03/19/2023 CLINICAL DATA:  Abdominal pain, acute, nonlocalized r/o sbo or other acute process. EXAM: CT ABDOMEN AND PELVIS WITH CONTRAST TECHNIQUE: Multidetector CT imaging of the abdomen and pelvis was performed using the standard protocol following bolus administration of intravenous contrast. RADIATION DOSE REDUCTION: This exam was performed according to the departmental dose-optimization program which includes automated exposure control, adjustment of the mA and/or kV according to patient size and/or use of iterative reconstruction technique. CONTRAST:  OMNIPAQUE IOHEXOL 300 MG/ML  SOLN COMPARISON:  CT scan abdomen and pelvis from 05/20/2017. FINDINGS: Lower chest: Bilateral peripheral/subpleural reticulations and honeycombing noted. Findings favor chronic interstitial lung disease. The lung bases are otherwise clear. No pleural effusion. The heart is normal in size. No pericardial effusion. Hepatobiliary: The liver is normal in size. Non-cirrhotic configuration. No suspicious mass. There is mild diffuse hepatic steatosis. There are several subcentimeter sized hypoattenuating foci throughout the liver, which are too small to adequately characterize but present since the prior study and favored to represent cysts. No intrahepatic or extrahepatic bile duct dilation. There is a single 1.2 x 1.3 cm gallstone without imaging signs of acute cholecystitis. Normal gallbladder wall thickness. No pericholecystic inflammatory changes. Pancreas: Small/atrophic pancreas. No focal mass or peripancreatic fat stranding. Main pancreatic duct is  not dilated. Spleen: Within normal limits. No focal lesion. Adrenals/Urinary Tract: Stable nodularity of bilateral adrenal glands, with left adrenal gland measuring 1.2 x 1.4 cm and right adrenal gland measuring 1.2 x 1.6 cm. No suspicious renal mass. No hydroureteronephrosis. There are linear calcifications in the bilateral kidneys, which are favored to represent vascular calcifications. No nephroureterolithiasis. Unremarkable urinary bladder. Stomach/Bowel: Stomach, duodenum and proximal jejunal loops are nondilated. However, there is gradual dilation of the distal small bowel loops measuring up to 3.8 cm in diameter. There is point of transition in the midline lower abdomen (series 8, image 30 and series 2, image 54), proximal to which, there is short segment of fecalized distal small bowel. Distal to this point, there are several collapsed distal ileal loops which exhibit diffuse wall thickening and surrounding fat stranding. The ileocecal junction is in the right lower quadrant. The colon is nondilated. Proximal to the above described transition point there are multiple dilated bowel loops exhibiting mild surrounding fat stranding as well as mesenteric fat stranding and inter bowel free fluid. Findings favor high-grade small-bowel obstruction probably secondary to adhesions and associated with enteritis. No pneumatosis, pneumoperitoneum or portal venous gas. No walled-off abscess or loculated collection. Vascular/Lymphatic: No abdominal or pelvic lymphadenopathy, by size criteria. No aneurysmal dilation of the major abdominal arteries. There are moderate peripheral atherosclerotic vascular calcifications of the aorta and its major branches. Reproductive: The uterus is surgically absent. No large adnexal mass. Other: The visualized soft tissues and abdominal wall are unremarkable. Musculoskeletal: No suspicious osseous lesions. There are mild multilevel degenerative changes in the visualized spine. IMPRESSION:  1. Findings favor high-grade small-bowel obstruction with transition point in the midline lower abdomen, associated with enteritis, as described in detail above. No pneumatosis, pneumoperitoneum or portal venous gas. No walled-off abscess or loculated collection. 2. Multiple other nonacute observations (  such as chronic interstitial lung disease in the visualized bilateral lung bases, diffuse hepatic steatosis, multiple liver cysts, stable nodularity of bilateral adrenal glands, etc.), As described above. Aortic Atherosclerosis (ICD10-I70.0). Electronically Signed   By: Jules Schick M.D.   On: 03/19/2023 14:57    Procedures Procedures    Medications Ordered in ED Medications  ondansetron (ZOFRAN) injection 4 mg (4 mg Intravenous Not Given 03/19/23 1121)  HYDROmorphone (DILAUDID) injection 0.5 mg (has no administration in time range)  ondansetron (ZOFRAN) injection 4 mg (has no administration in time range)  lactated ringers bolus 1,000 mL (0 mLs Intravenous Stopped 03/19/23 1239)  pantoprazole (PROTONIX) injection 40 mg (40 mg Intravenous Given 03/19/23 1104)  fentaNYL (SUBLIMAZE) injection 25 mcg (25 mcg Intravenous Given 03/19/23 1102)  iohexol (OMNIPAQUE) 300 MG/ML solution 100 mL (100 mLs Intravenous Contrast Given 03/19/23 1256)  HYDROmorphone (DILAUDID) injection 0.5 mg (0.5 mg Intravenous Given 03/19/23 1249)    ED Course/ Medical Decision Making/ A&P                                 Medical Decision Making Problems Addressed: 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 Labs: ordered. Decision-making details documented in ED Course. Radiology: ordered and independent interpretation performed. Decision-making details documented in ED Course. Discussion of management or test interpretation with external provider(s): Surgery, medicine  Risk Prescription drug management. Parenteral controlled substances. Decision regarding hospitalization.   Iv ns. Continuous pulse ox and cardiac monitoring. Labs ordered/sent. Imaging ordered.   Differential diagnosis includes sbo, gastritis, pud, pancreatitis, biliary colic, etc. Dispo decision including potential need for admission considered - will get labs and imaging and reassess.   Reviewed nursing notes and prior charts for additional history. External reports reviewed. Additional history from: EMS.  LR bolus iv. Zofran iv, fentanyl iv. Protonix iv.   Cardiac monitor: sinus rhythm, rate 83  Labs reviewed/interpreted by me - wbc and hgb normal. Chem unremarkable. Lipase normal.   CT reviewed/interpreted by me - sbo. Pt will need to f/u pcp re other incidental findings on ct - hepatic cyst, ILD, adrenal nodules, gallstones.   Recheck pain improved but persists. No peritoneal signs. NG to LIWS. Dilaudid iv. Zofran Iv.  General surgery consulted. Discussed pt with Dr Carolynne Edouard - will adimt. Requests medicine admission.   Hospitalists consulted for admission.  CRITICAL CARE RE: acute high grade small bowel obstruction Performed by: Suzi Roots Total critical care time: 45 minutes Critical care time was exclusive of separately billable procedures and treating other patients. Critical care was necessary to treat or prevent imminent or life-threatening deterioration. Critical care was time spent personally by me on the following activities: development of treatment plan with patient and/or surrogate as well as nursing, discussions with consultants, evaluation of patient's response to  treatment, examination of patient, obtaining history from patient or surrogate, ordering and performing treatments and interventions, ordering and review of laboratory studies, ordering and review of radiographic studies, pulse oximetry and re-evaluation of patient's condition.  Final Clinical Impression(s) / ED Diagnoses Final diagnoses:  Generalized abdominal pain  Small bowel obstruction [facility])    Rx / DC Orders ED Discharge Orders     None         Cathren Laine, MD 03/19/23 [phone]

## 2023-03-19 NOTE — ED Triage Notes (Signed)
 BIB EMS due to abd pain. Has "knot"  in upper abd. Has history of Bowel obst. Pt lastr BM 3 days ago, small one this morning. #20 L AC with Ketamine 10.9 MG.4 MG Zofran given. 136/78-82-99% RA CBG 144

## 2023-03-19 NOTE — Consult Note (Signed)
 Reason for Consult: Vomiting Referring Physician: Dr. Ronette Deter is an 73 y.o. female.  HPI: The patient is a 73 year old white female who began having abdominal pain the day before yesterday.  The pain seemed to worsen for her yesterday.  The pain has been associated with nausea and vomiting.  She came to the emergency department where a CT scan was suggestive of a small bowel obstruction.  She has had multiple abdominal surgeries in the past  Past Medical History:  Diagnosis Date   Abnormality of gait 06/05/2014   Anxiety    Cluster headache    Incontinence of urine    Insomnia secondary to anxiety    Lactose intolerance    Memory difficulties 06/05/2014    Past Surgical History:  Procedure Laterality Date   ABDOMINAL HYSTERECTOMY     APPENDECTOMY      Family History  Problem Relation Age of Onset   Transient ischemic attack Mother    Cancer Mother        lung (smoker)   Anxiety disorder Mother    Cancer Father        prostate   Hypertension Father    Mental retardation Sister    Healthy Brother    Healthy Brother    Cancer Maternal Grandmother        leukemia    Social History:  reports that she has been smoking cigarettes. She has never used smokeless tobacco. She reports that she does not drink alcohol and does not use drugs.  Allergies:  Allergies  Allergen Reactions   Meperidine Hives and Rash    Other Reaction(s): Other  Not sure   Morphine And Codeine Other (See Comments), Hives and Rash    Hallucinations  Other Reaction(s): Other  Not spec  sweating  Hallucinations  sweating   Penicillins Hives, Palpitations and Rash    Has patient had a PCN reaction causing immediate rash, facial/tongue/throat swelling, SOB or lightheadedness with hypotension: yes  Has patient had a PCN reaction causing severe rash involving mucus membranes or skin necrosis: yes  Has patient had a PCN reaction that required hospitalization: no  Has patient had a PCN  reaction occurring within the last 10 years: no  If all of the above answers are "NO", then may proceed with Cephalosporin use.  Other Reaction(s): Other  Do not know  Has patient had a PCN reaction causing immediate rash, facial/tongue/throat swelling, SOB or lightheadedness with hypotension: yes  Has patient had a PCN reaction causing severe rash involving mucus membranes or skin necrosis: yes  Has patient had a PCN reaction that required hospitalization: no  Has patient had a PCN reaction occurring within the last 10 years: no  If all of the above answers are "NO", then may proceed with Cephalosporin use.   Buprenorphine Hcl     Other Reaction(s): Other  Hallucinations   Levofloxacin Other (See Comments) and Rash    Per pt report.  Burning veins  Other Reaction(s): Other  Not sure  Per pt report.  Burning veins  Per pt report.  Burning veins   Sulfa Antibiotics Hives    Other Reaction(s): Other  Not spec   Codeine Hives and Other (See Comments)    sweating    Medications: I have reviewed the patient's current medications.  Results for orders placed or performed during the hospital encounter of 03/19/23 (from the past 48 hours)  CBC     Status: None   Collection Time: 03/19/23  10:45 AM  Result Value Ref Range   WBC 10.0 4.0 - 10.5 K/uL   RBC 4.45 3.87 - 5.11 MIL/uL   Hemoglobin 12.7 12.0 - 15.0 g/dL   HCT 60.4 54.0 - 98.1 %   MCV 90.1 80.0 - 100.0 fL   MCH 28.5 26.0 - 34.0 pg   MCHC 31.7 30.0 - 36.0 g/dL   RDW 19.1 47.8 - 29.5 %   Platelets 266 150 - 400 K/uL   nRBC 0.0 0.0 - 0.2 %    Comment: Performed at [facility], [address] [place], [place] [address]  Comprehensive metabolic panel     Status: Abnormal   Collection Time: 03/19/23 10:45 AM  Result Value Ref Range   Sodium 140 135 - 145 mmol/L   Potassium 4.0 3.5 - 5.1 mmol/L   Chloride 107 98 - 111 mmol/L   CO2 24 22 - 32 mmol/L   Glucose, Bld 117 (H) 70 - 99 mg/dL    Comment:  Glucose reference range applies only to samples taken after fasting for at least 8 hours.   BUN 21 8 - 23 mg/dL   Creatinine, Ser 8.65 0.44 - 1.00 mg/dL   Calcium 9.6 8.9 - 78.4 mg/dL   Total Protein 6.9 6.5 - 8.1 g/dL   Albumin 3.7 3.5 - 5.0 g/dL   AST 17 15 - 41 U/L   ALT 11 0 - 44 U/L   Alkaline Phosphatase 57 38 - 126 U/L   Total Bilirubin 0.5 0.0 - 1.2 mg/dL   GFR, Estimated >69 >62 mL/min    Comment: (NOTE) Calculated using the CKD-EPI Creatinine Equation (2021)    Anion gap 9 5 - 15    Comment: Performed at [facility], [address], [place], [place] [address]  Lipase, blood     Status: None   Collection Time: 03/19/23 10:45 AM  Result Value Ref Range   Lipase 33 11 - 51 U/L    Comment: Performed at [facility], [address], [place], [place] [address]    CT ABDOMEN PELVIS W CONTRAST Result Date: 03/19/2023 CLINICAL DATA:  Abdominal pain, acute, nonlocalized r/o sbo or other acute process. EXAM: CT ABDOMEN AND PELVIS WITH CONTRAST TECHNIQUE: Multidetector CT imaging of the abdomen and pelvis was performed using the standard protocol following bolus administration of intravenous contrast. RADIATION DOSE REDUCTION: This exam was performed according to the departmental dose-optimization program which includes automated exposure control, adjustment of the mA and/or kV according to patient size and/or use of iterative reconstruction technique. CONTRAST:  OMNIPAQUE IOHEXOL 300 MG/ML  SOLN COMPARISON:  CT scan abdomen and pelvis from 05/20/2017. FINDINGS: Lower chest: Bilateral peripheral/subpleural reticulations and honeycombing noted. Findings favor chronic interstitial lung disease. The lung bases are otherwise clear. No pleural effusion. The heart is normal in size. No pericardial effusion. Hepatobiliary: The liver is normal in size. Non-cirrhotic configuration. No suspicious mass. There is mild diffuse hepatic steatosis. There are several  subcentimeter sized hypoattenuating foci throughout the liver, which are too small to adequately characterize but present since the prior study and favored to represent cysts. No intrahepatic or extrahepatic bile duct dilation. There is a single 1.2 x 1.3 cm gallstone without imaging signs of acute cholecystitis. Normal gallbladder wall thickness. No pericholecystic inflammatory changes. Pancreas: Small/atrophic pancreas. No focal mass or peripancreatic fat stranding. Main pancreatic duct is not dilated. Spleen: Within normal limits. No focal lesion. Adrenals/Urinary Tract: Stable nodularity of bilateral adrenal glands,  with left adrenal gland measuring 1.2 x 1.4 cm and right adrenal gland measuring 1.2 x 1.6 cm. No suspicious renal mass. No hydroureteronephrosis. There are linear calcifications in the bilateral kidneys, which are favored to represent vascular calcifications. No nephroureterolithiasis. Unremarkable urinary bladder. Stomach/Bowel: Stomach, duodenum and proximal jejunal loops are nondilated. However, there is gradual dilation of the distal small bowel loops measuring up to 3.8 cm in diameter. There is point of transition in the midline lower abdomen (series 8, image 30 and series 2, image 54), proximal to which, there is short segment of fecalized distal small bowel. Distal to this point, there are several collapsed distal ileal loops which exhibit diffuse wall thickening and surrounding fat stranding. The ileocecal junction is in the right lower quadrant. The colon is nondilated. Proximal to the above described transition point there are multiple dilated bowel loops exhibiting mild surrounding fat stranding as well as mesenteric fat stranding and inter bowel free fluid. Findings favor high-grade small-bowel obstruction probably secondary to adhesions and associated with enteritis. No pneumatosis, pneumoperitoneum or portal venous gas. No walled-off abscess or loculated collection. Vascular/Lymphatic:  No abdominal or pelvic lymphadenopathy, by size criteria. No aneurysmal dilation of the major abdominal arteries. There are moderate peripheral atherosclerotic vascular calcifications of the aorta and its major branches. Reproductive: The uterus is surgically absent. No large adnexal mass. Other: The visualized soft tissues and abdominal wall are unremarkable. Musculoskeletal: No suspicious osseous lesions. There are mild multilevel degenerative changes in the visualized spine. IMPRESSION: 1. Findings favor high-grade small-bowel obstruction with transition point in the midline lower abdomen, associated with enteritis, as described in detail above. No pneumatosis, pneumoperitoneum or portal venous gas. No walled-off abscess or loculated collection. 2. Multiple other nonacute observations (such as chronic interstitial lung disease in the visualized bilateral lung bases, diffuse hepatic steatosis, multiple liver cysts, stable nodularity of bilateral adrenal glands, etc.), As described above. Aortic Atherosclerosis (ICD10-I70.0). Electronically Signed   By: Jules Schick M.D.   On: 03/19/2023 14:57    Review of Systems  Constitutional: Negative.   HENT: Negative.    Eyes: Negative.   Respiratory: Negative.    Cardiovascular: Negative.   Gastrointestinal:  Positive for abdominal pain, nausea and vomiting.  Endocrine: Negative.   Genitourinary: Negative.   Musculoskeletal: Negative.   Skin: Negative.   Allergic/Immunologic: Negative.   Neurological: Negative.   Hematological: Negative.   Psychiatric/Behavioral: Negative.     Blood pressure 135/60, pulse 74, temperature 97.8 F (36.6 C), temperature source Oral, resp. rate 18, height 5\' 7"  (1.702 m), weight 63 kg, SpO2 95%. Physical Exam Vitals reviewed.  Constitutional:      General: She is not in acute distress.    Appearance: Normal appearance.  HENT:     Head: Normocephalic and atraumatic.     Right Ear: External ear normal.     Left Ear:  External ear normal.     Nose: Nose normal.     Mouth/Throat:     Mouth: Mucous membranes are dry.     Pharynx: Oropharynx is clear.  Eyes:     General: No scleral icterus.    Extraocular Movements: Extraocular movements intact.     Conjunctiva/sclera: Conjunctivae normal.     Pupils: Pupils are equal, round, and reactive to light.  Cardiovascular:     Rate and Rhythm: Normal rate and regular rhythm.     Pulses: Normal pulses.     Heart sounds: Normal heart sounds.  Pulmonary:     Effort: Pulmonary  effort is normal. No respiratory distress.     Breath sounds: Normal breath sounds.  Abdominal:     Palpations: Abdomen is soft.     Comments: There is moderate epigastric tenderness.  Musculoskeletal:        General: No swelling or deformity. Normal range of motion.     Cervical back: Normal range of motion and neck supple.  Skin:    General: Skin is warm and dry.     Coloration: Skin is not jaundiced.  Neurological:     General: No focal deficit present.     Mental Status: She is alert and oriented to person, place, and time.  Psychiatric:        Mood and Affect: Mood normal.        Behavior: Behavior normal.     Assessment/Plan: The patient appears to have a small bowel obstruction.  This is most likely due to adhesions.  At this point I would agree with NG tube decompression.  She will need admission to the medical service with IV hydration.  Once she has been adequately decompressed we will start the small bowel protocol and monitor her closely  Rachel Bryan 03/19/2023, 4:36 PM

## 2023-03-20 ENCOUNTER — Inpatient Hospital Stay (HOSPITAL_COMMUNITY): Payer: 59

## 2023-03-20 DIAGNOSIS — K56609 Unspecified intestinal obstruction, unspecified as to partial versus complete obstruction: Secondary | ICD-10-CM | POA: Diagnosis not present

## 2023-03-20 LAB — CBC
HCT: 39.7 % (ref 36.0–46.0)
Hemoglobin: 12.2 g/dL (ref 12.0–15.0)
MCH: 29 pg (ref 26.0–34.0)
MCHC: 30.7 g/dL (ref 30.0–36.0)
MCV: 94.3 fL (ref 80.0–100.0)
Platelets: 235 10*3/uL (ref 150–400)
RBC: 4.21 MIL/uL (ref 3.87–5.11)
RDW: 13.1 % (ref 11.5–15.5)
WBC: 7.6 10*3/uL (ref 4.0–10.5)
nRBC: 0 % (ref 0.0–0.2)

## 2023-03-20 LAB — COMPREHENSIVE METABOLIC PANEL
ALT: 8 U/L (ref 0–44)
AST: 14 U/L — ABNORMAL LOW (ref 15–41)
Albumin: 3.2 g/dL — ABNORMAL LOW (ref 3.5–5.0)
Alkaline Phosphatase: 49 U/L (ref 38–126)
Anion gap: 9 (ref 5–15)
BUN: 18 mg/dL (ref 8–23)
CO2: 25 mmol/L (ref 22–32)
Calcium: 9.1 mg/dL (ref 8.9–10.3)
Chloride: 110 mmol/L (ref 98–111)
Creatinine, Ser: 0.83 mg/dL (ref 0.44–1.00)
GFR, Estimated: >60 mL/min (ref >60)
Glucose, Bld: 119 mg/dL — ABNORMAL HIGH (ref 70–99)
Potassium: 4.2 mmol/L (ref 3.5–5.1)
Sodium: 144 mmol/L (ref 135–145)
Total Bilirubin: 0.6 mg/dL (ref 0.0–1.2)
Total Protein: 6.1 g/dL — ABNORMAL LOW (ref 6.5–8.1)

## 2023-03-20 LAB — URINALYSIS, ROUTINE W REFLEX MICROSCOPIC
Bilirubin Urine: NEGATIVE
Glucose, UA: NEGATIVE mg/dL
Ketones, ur: NEGATIVE mg/dL
Nitrite: POSITIVE — AB
Protein, ur: NEGATIVE mg/dL
Specific Gravity, Urine: 1.018 (ref 1.005–1.030)
pH: 6 (ref 5.0–8.0)

## 2023-03-20 LAB — MAGNESIUM: Magnesium: 2.1 mg/dL (ref 1.7–2.4)

## 2023-03-20 LAB — PHOSPHORUS: Phosphorus: 2.8 mg/dL (ref 2.5–4.6)

## 2023-03-20 MED ORDER — DIATRIZOATE MEGLUMINE & SODIUM 66-10 % PO SOLN
90.0000 mL | Freq: Once | ORAL | Status: AC
  Administered 2023-03-20: 90 mL via NASOGASTRIC
  Filled 2023-03-20: qty 90

## 2023-03-20 MED ORDER — PHENOL 1.4 % MT LIQD
1.0000 | OROMUCOSAL | Status: DC | PRN
  Administered 2023-03-20: 1 via OROMUCOSAL
  Filled 2023-03-20: qty 177

## 2023-03-20 MED ORDER — SODIUM CHLORIDE 0.9 % IV SOLN: INTRAVENOUS | Status: DC

## 2023-03-20 NOTE — Progress Notes (Signed)
 Mobility Specialist - Progress Note   03/20/23 1305  Mobility  Activity Transferred from bed to chair  Level of Assistance Contact guard assist, steadying assist  Assistive Device None  Activity Response Tolerated well  Mobility Referral Yes  Mobility visit 1 Mobility  Mobility Specialist Start Time (ACUTE ONLY) 1255  Mobility Specialist Stop Time (ACUTE ONLY) 1305  Mobility Specialist Time Calculation (min) (ACUTE ONLY) 10 min   Pt received in bed and agreeable to mobility. Once standing pt was unsteady prompting in sitting back down. Opted out to assist pt to the recliner. C/o pain throughout session & RN notified. No other complaints during session. Pt to recliner after session with all needs met. RN in room.   [facility]

## 2023-03-20 NOTE — Plan of Care (Signed)

## 2023-03-20 NOTE — Progress Notes (Signed)
 [facility] Progress Note     Subjective: CC:  Reports less abdominal discomfort with NG tube in place. Reports some flatus but no BM.   Objective: Vital signs in last 24 hours: Temp:  [97.7 F (36.5 C)-98.8 F (37.1 C)] 98.7 F (37.1 C) (02/25 0718) Pulse Rate:  [70-84] 84 (02/25 0718) Resp:  [14-24] 16 (02/25 0718) BP: (112-135)/(56-102) 116/77 (02/25 0718) SpO2:  [91 %-98 %] 93 % (02/25 0718) Last BM Date : 03/20/23  Intake/Output from previous day: 02/24 0701 - 02/25 0700 In: 1332.1 [I.V.:332.1; IV Piggyback:1000] Out: 650 [Urine:600; Emesis/NG output:50] Intake/Output this shift: Total I/O In: 551.7 [P.O.:60; I.V.:491.7] Out: 400 [Urine:350; Emesis/NG output:50]  PE: Gen:  Alert, NAD, pleasant Card:  Regular rate and rhythm, pedal pulses 2+ BL Pulm:  Normal effort, clear to auscultation bilaterally Abd: Soft, mild epigastric tenderness without guarding, +BS, NG with clear bilious drainage. Skin: warm and dry, no rashes  Psych: A&Ox3   Lab Results:  Recent Labs    03/19/23 1045 03/20/23 0445  WBC 10.0 7.6  HGB 12.7 12.2  HCT 40.1 39.7  PLT 266 235   BMET Recent Labs    03/19/23 1045 03/20/23 0445  NA 140 144  K 4.0 4.2  CL 107 110  CO2 24 25  GLUCOSE 117* 119*  BUN 21 18  CREATININE 0.75 0.83  CALCIUM 9.6 9.1   PT/INR No results for input(s): "LABPROT", "INR" in the last 72 hours. CMP     Component Value Date/Time   NA 144 03/20/2023 0445   NA 142 07/07/2021 1203   K 4.2 03/20/2023 0445   CL 110 03/20/2023 0445   CO2 25 03/20/2023 0445   GLUCOSE 119 (H) 03/20/2023 0445   BUN 18 03/20/2023 0445   BUN 14 07/07/2021 1203   CREATININE 0.83 03/20/2023 0445   CREATININE 0.85 03/24/2015 1547   CALCIUM 9.1 03/20/2023 0445   PROT 6.1 (L) 03/20/2023 0445   PROT 7.0 07/14/2020 1339   ALBUMIN 3.2 (L) 03/20/2023 0445   ALBUMIN 4.4 07/14/2020 1339   AST 14 (L) 03/20/2023 0445   ALT 8 03/20/2023 0445   ALKPHOS 49 03/20/2023 0445    BILITOT 0.6 03/20/2023 0445   BILITOT 0.4 07/14/2020 1339   GFRNONAA >60 03/20/2023 0445   GFRNONAA 73 03/24/2015 1547   GFRAA >60 05/25/2017 0829   GFRAA 84 03/24/2015 1547   Lipase     Component Value Date/Time   LIPASE 33 03/19/2023 1045       Studies/Results: DG Abd 1 View Result Date: 03/19/2023 CLINICAL DATA:  Nasogastric tube status post advancement. EXAM: ABDOMEN - 1 VIEW COMPARISON:  Abdominal x-ray 03/19/2023 FINDINGS: Nasogastric tube tip is now in the distal body of the stomach. IMPRESSION: Nasogastric tube tip is now in the distal body of the stomach. Electronically Signed   By: Darliss Cheney M.D.   On: 03/19/2023 23:43   DG Abd Portable 1 View Result Date: 03/19/2023 CLINICAL DATA:  Confirm NG tube placement EXAM: PORTABLE ABDOMEN - 1 VIEW COMPARISON:  CT 03/19/2023 FINDINGS: NG tube tip is in the distal esophagus. Recommend advancing approximately 9 cm. Visualized lung bases clear. IMPRESSION: NG tube tip in the distal esophagus. Recommend advancing approximately 9 cm. Electronically Signed   By: Charlett Nose M.D.   On: 03/19/2023 19:03   DG Chest Port 1 View Result Date: 03/19/2023 CLINICAL DATA:  Right chest pain. EXAM: PORTABLE CHEST 1 VIEW COMPARISON:  07/20/2021.  CT 09/20/2022 FINDINGS: There is hyperinflation  of the lungs compatible with COPD. NG tube enters the stomach. Heart and mediastinal contours within normal limits. Aortic calcifications. Biapical scarring. No acute confluent airspace opacities or effusions. No acute bony abnormality. IMPRESSION: COPD/chronic changes. No active disease. Electronically Signed   By: Charlett Nose M.D.   On: 03/19/2023 19:02   CT ABDOMEN PELVIS W CONTRAST Result Date: 03/19/2023 CLINICAL DATA:  Abdominal pain, acute, nonlocalized r/o sbo or other acute process. EXAM: CT ABDOMEN AND PELVIS WITH CONTRAST TECHNIQUE: Multidetector CT imaging of the abdomen and pelvis was performed using the standard protocol following bolus  administration of intravenous contrast. RADIATION DOSE REDUCTION: This exam was performed according to the departmental dose-optimization program which includes automated exposure control, adjustment of the mA and/or kV according to patient size and/or use of iterative reconstruction technique. CONTRAST:  OMNIPAQUE IOHEXOL 300 MG/ML  SOLN COMPARISON:  CT scan abdomen and pelvis from 05/20/2017. FINDINGS: Lower chest: Bilateral peripheral/subpleural reticulations and honeycombing noted. Findings favor chronic interstitial lung disease. The lung bases are otherwise clear. No pleural effusion. The heart is normal in size. No pericardial effusion. Hepatobiliary: The liver is normal in size. Non-cirrhotic configuration. No suspicious mass. There is mild diffuse hepatic steatosis. There are several subcentimeter sized hypoattenuating foci throughout the liver, which are too small to adequately characterize but present since the prior study and favored to represent cysts. No intrahepatic or extrahepatic bile duct dilation. There is a single 1.2 x 1.3 cm gallstone without imaging signs of acute cholecystitis. Normal gallbladder wall thickness. No pericholecystic inflammatory changes. Pancreas: Small/atrophic pancreas. No focal mass or peripancreatic fat stranding. Main pancreatic duct is not dilated. Spleen: Within normal limits. No focal lesion. Adrenals/Urinary Tract: Stable nodularity of bilateral adrenal glands, with left adrenal gland measuring 1.2 x 1.4 cm and right adrenal gland measuring 1.2 x 1.6 cm. No suspicious renal mass. No hydroureteronephrosis. There are linear calcifications in the bilateral kidneys, which are favored to represent vascular calcifications. No nephroureterolithiasis. Unremarkable urinary bladder. Stomach/Bowel: Stomach, duodenum and proximal jejunal loops are nondilated. However, there is gradual dilation of the distal small bowel loops measuring up to 3.8 cm in diameter. There is point  of transition in the midline lower abdomen (series 8, image 30 and series 2, image 54), proximal to which, there is short segment of fecalized distal small bowel. Distal to this point, there are several collapsed distal ileal loops which exhibit diffuse wall thickening and surrounding fat stranding. The ileocecal junction is in the right lower quadrant. The colon is nondilated. Proximal to the above described transition point there are multiple dilated bowel loops exhibiting mild surrounding fat stranding as well as mesenteric fat stranding and inter bowel free fluid. Findings favor high-grade small-bowel obstruction probably secondary to adhesions and associated with enteritis. No pneumatosis, pneumoperitoneum or portal venous gas. No walled-off abscess or loculated collection. Vascular/Lymphatic: No abdominal or pelvic lymphadenopathy, by size criteria. No aneurysmal dilation of the major abdominal arteries. There are moderate peripheral atherosclerotic vascular calcifications of the aorta and its major branches. Reproductive: The uterus is surgically absent. No large adnexal mass. Other: The visualized soft tissues and abdominal wall are unremarkable. Musculoskeletal: No suspicious osseous lesions. There are mild multilevel degenerative changes in the visualized spine. IMPRESSION: 1. Findings favor high-grade small-bowel obstruction with transition point in the midline lower abdomen, associated with enteritis, as described in detail above. No pneumatosis, pneumoperitoneum or portal venous gas. No walled-off abscess or loculated collection. 2. Multiple other nonacute observations (such as chronic interstitial lung  disease in the visualized bilateral lung bases, diffuse hepatic steatosis, multiple liver cysts, stable nodularity of bilateral adrenal glands, etc.), As described above. Aortic Atherosclerosis (ICD10-I70.0). Electronically Signed   By: Jules Schick M.D.   On: 03/19/2023 14:57     Anti-infectives: Anti-infectives (From admission, onward)    None        Assessment/Plan  SBO, likely due to adhesions - SBO protocol today, no role for emergent surgery.  - ice chips ok  - IVF per primary     LOS: 1 day   I reviewed nursing notes, hospitalist notes, last 24 h vitals and pain scores, last 48 h intake and output, last 24 h labs and trends, and last 24 h imaging results.  This care required moderate level of medical decision making.   Hosie Spangle, PA-C [facility] Please see Amion for pager number during day hours 7:00am-4:30pm

## 2023-03-20 NOTE — TOC Initial Note (Signed)
 Transition of Care [facility]) - Initial/Assessment Note    Patient Details  Name: Rachel Bryan MRN: [id number] Date of Birth: 07-Jul-1950  Transition [facility]) CM/SW Contact:    Howell Rucks, RN Phone Number: 03/20/2023, 1:03 PM  Clinical Narrative:  Met with pt bedside to introduce role of TOC/NCM and review for dc planning, pt reports she has a PCP and pharmacy, no current home care services or home DME, pt reports she resides with her sister who she cares for, pt reports she receives support from her dtr. TOC will continue to follow.                   Expected Discharge Plan: Home/Self Care Barriers to Discharge: Continued Medical Work up   Patient Goals and CMS Choice Patient states their goals for this hospitalization and ongoing recovery are:: return home          Expected Discharge Plan and Services       Living arrangements for the past 2 months: Single Family Home                                      Prior Living Arrangements/Services Living arrangements for the past 2 months: Single Family Home Lives with:: Siblings Patient language and need for interpreter reviewed:: Yes Do you feel safe going back to the place where you live?: Yes      Need for Family Participation in Patient Care: Yes (Comment) Care giver support system in place?: Yes (comment)   Criminal Activity/Legal Involvement Pertinent to Current Situation/Hospitalization: No - Comment as needed  Activities of Daily Living   ADL Screening (condition at time of admission) Independently performs ADLs?: Yes (appropriate for developmental age) Is the patient deaf or have difficulty hearing?: No Does the patient have difficulty seeing, even when wearing glasses/contacts?: No Does the patient have difficulty concentrating, remembering, or making decisions?: No  Permission Sought/Granted                  Emotional Assessment Appearance:: Appears stated age Attitude/Demeanor/Rapport:  Gracious Affect (typically observed): Accepting Orientation: : Oriented to Self, Oriented to Place, Oriented to  Time, Oriented to Situation Alcohol / Substance Use: Not Applicable Psych Involvement: No (comment)  Admission diagnosis:  Hepatic cyst [K76.89] Small bowel obstruction (HCC) [K56.609] SBO (small bowel obstruction) (HCC) [K56.609] Gallstones [K80.20] Generalized abdominal pain [R10.84] Adrenal nodule (HCC) [E27.9] Patient Active Problem List   Diagnosis Date Noted   SBO (small bowel obstruction) (HCC) 03/19/2023   COPD (chronic obstructive pulmonary disease) (HCC) 03/19/2023   Coronary artery calcification 08/08/2022   Greater trochanteric bursitis of left hip 11/14/2021   Thrombosis of right saphenous vein 08/18/2021   Tobacco abuse 07/21/2021   COPD suggested by initial evaluation (HCC) 07/21/2021   Cough 07/20/2021   Breast pain, left 03/02/2021   Colon cancer screening 03/02/2021   Urge incontinence 12/29/2020   Chest pain 11/10/2020   Nocturnal leg cramps 11/10/2020   Vertigo 09/23/2020   Weakness 07/14/2020   Hypercholesterolemia 07/14/2020   Food insecurity 07/14/2020   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 [phone] [place], [place] -  [address] AT [address] [place] [address] Phone: [phone] Fax: [phone]  [facility] #[id number] [place], [place] - [address] AT [facility] [address] [place] [address] Phone: [phone] Fax: [phone]  [facility] - [address], [place] - [address] [place] [address] Phone: [phone] Fax: [phone]     Social Drivers of Health (SDOH) Social  History: SDOH Screenings   Food Insecurity: Food Insecurity Present (03/19/2023)  Housing: Low Risk  (03/19/2023)  Transportation Needs: No Transportation Needs (03/19/2023)  Utilities: Not At Risk (03/19/2023)  Alcohol Screen: Low Risk  (11/06/2019)  Depression (PHQ2-9): Medium Risk (11/14/2021)  Financial Resource Strain: Medium Risk (02/24/2022)   Received from [facility], [facility]  Physical Activity: Unknown (02/24/2022)   Received from [facility], [facility]  Social Connections: Socially Isolated (03/19/2023)  Stress: Stress Concern Present (02/24/2022)   Received from [facility], [facility]  Tobacco Use: High Risk (03/19/2023)   SDOH Interventions: Food Insecurity Interventions: Community Resources Provided, Inpatient TOC   Readmission Risk Interventions    03/20/2023    1:01 PM  Readmission Risk Prevention Plan  Post Dischage Appt Complete  Medication Screening Complete  Transportation Screening Complete

## 2023-03-20 NOTE — Plan of Care (Signed)
   Problem: Clinical Measurements: Goal: Diagnostic test results will improve Outcome: Progressing

## 2023-03-20 NOTE — Progress Notes (Signed)
 Food resources added to AVS

## 2023-03-20 NOTE — Progress Notes (Signed)
 PROGRESS NOTE    Rachel Bryan  XBJ:[id number] DOB: April 13, 1950 DOA: 03/19/2023 PCP: Sharmon Revere, MD    Brief Narrative: 73 year old female with history of bowel obstruction admitted with constipation no BM for 3 days prior to admission to hospital, nausea and vomiting. She has had a history of appendectomy and abdominal hysterectomy. CT abdomen- Findings favor high-grade small-bowel obstruction with transition point in the midline lower abdomen, associated with enteritis, as described in detail above. No pneumatosis, pneumoperitoneum or portal venous gas. No walled-off abscess or loculated collection.Multiple other nonacute observations (such as chronic interstitial lung disease in the visualized bilateral lung bases, diffuse hepatic steatosis, multiple liver cysts, stable nodularity of bilateral adrenal glands, etc.) Surgery consulted Assessment & Plan:   Principal Problem:   SBO (small bowel obstruction) (HCC) Active Problems:   Anxiety and depression   Hypercholesterolemia   Tobacco abuse   Coronary artery calcification   COPD (chronic obstructive pulmonary disease) (HCC)   #1 small bowel obstruction admitted with nausea vomiting constipation abdominal pain and distention.  NG tube placed continue IV fluids surgery following small bowel protocol in place.  Patient had flatus no bowel movements yet.  #2 history of COPD and tobacco abuse stable not in exacerbation continue nicotine patch  #3 anxiety and depression restart BuSpar once she is able to take p.o.  #4 hyperlipidemia on Crestor  Estimated body mass index is 21.75 kg/m as calculated from the following:   Height as of this encounter: 5\' 7"  (1.702 m).   Weight as of this encounter: 63 kg.  DVT prophylaxis: Lovenox Code Status: Full code Family Communication: None at bedside  disposition Plan:  Status is: Inpatient Remains inpatient appropriate because: Small bowel obstruction   Consultants:  General  surgery  Procedures: NG tube  CT scan abdomen  antimicrobials: None  Subjective: Resting in bed NG draining bile colored drainage noted she reports having flatus no BMs no nausea  Objective: Vitals:   03/19/23 2130 03/19/23 2153 03/20/23 0153 03/20/23 0718  BP: 123/86 125/61 112/61 116/77  Pulse: 78 70 81 84  Resp: 18 17 16 16   Temp:  97.7 F (36.5 C) 98.5 F (36.9 C) 98.7 F (37.1 C)  TempSrc:  Oral Oral Oral  SpO2: 95% 95% 91% 93%  Weight:      Height:        Intake/Output Summary (Last 24 hours) at 03/20/2023 1345 Last data filed at 03/20/2023 1000 Gross per 24 hour  Intake 883.76 ml  Output 1050 ml  Net -166.24 ml   Filed Weights   03/19/23 1037  Weight: 63 kg    Examination:  General exam: Appears in no acute distress Respiratory system: Clear to auscultation. Respiratory effort normal. Cardiovascular system: S1 & S2 heard, RRR. No JVD, murmurs, rubs, gallops or clicks. No pedal edema. Gastrointestinal system: Abdomen is nondistended, soft and nontender.  Central nervous system: Alert and oriented. No focal neurological deficits. Extremities: Symmetric 5 x 5 power. Data Reviewed: I have personally reviewed following labs and imaging studies  CBC: Recent Labs  Lab 03/19/23 1045 03/20/23 0445  WBC 10.0 7.6  HGB 12.7 12.2  HCT 40.1 39.7  MCV 90.1 94.3  PLT 266 235   Basic Metabolic Panel: Recent Labs  Lab 03/19/23 1045 03/20/23 0445  NA 140 144  K 4.0 4.2  CL 107 110  CO2 24 25  GLUCOSE 117* 119*  BUN 21 18  CREATININE 0.75 0.83  CALCIUM 9.6 9.1  MG  --  2.1  PHOS  --  2.8   GFR: Estimated Creatinine Clearance: 60.5 mL/min (by C-G formula based on SCr of 0.83 mg/dL). Liver Function Tests: Recent Labs  Lab 03/19/23 1045 03/20/23 0445  AST 17 14*  ALT 11 8  ALKPHOS 57 49  BILITOT 0.5 0.6  PROT 6.9 6.1*  ALBUMIN 3.7 3.2*   Recent Labs  Lab 03/19/23 1045  LIPASE 33   No results for input(s): "AMMONIA" in the last 168  hours. Coagulation Profile: No results for input(s): "INR", "PROTIME" in the last 168 hours. Cardiac Enzymes: No results for input(s): "CKTOTAL", "CKMB", "CKMBINDEX", "TROPONINI" in the last 168 hours. BNP (last 3 results) No results for input(s): "PROBNP" in the last 8760 hours. HbA1C: No results for input(s): "HGBA1C" in the last 72 hours. CBG: No results for input(s): "GLUCAP" in the last 168 hours. Lipid Profile: No results for input(s): "CHOL", "HDL", "LDLCALC", "TRIG", "CHOLHDL", "LDLDIRECT" in the last 72 hours. Thyroid Function Tests: No results for input(s): "TSH", "T4TOTAL", "FREET4", "T3FREE", "THYROIDAB" in the last 72 hours. Anemia Panel: No results for input(s): "VITAMINB12", "FOLATE", "FERRITIN", "TIBC", "IRON", "RETICCTPCT" in the last 72 hours. Sepsis Labs: No results for input(s): "PROCALCITON", "LATICACIDVEN" in the last 168 hours.  No results found for this or any previous visit (from the past 240 hours).       Radiology Studies: DG Abd 1 View Result Date: 03/20/2023 CLINICAL DATA:  Abdominal pain EXAM: ABDOMEN - 1 VIEW COMPARISON:  the previous day's study FINDINGS: Gastric tube stable in the decompressed stomach. A few filled mid abdominal small bowel loops. The colon is nondilated.Aortoiliac calcified plaque. Dilute contrast material in the urinary bladder. 1.4 cm lamellated gallstone as before. IMPRESSION: 1. Nonobstructive bowel gas pattern. 2. Cholelithiasis. Electronically Signed   By: Corlis Leak M.D.   On: 03/20/2023 12:48   DG Abd 1 View Result Date: 03/19/2023 CLINICAL DATA:  Nasogastric tube status post advancement. EXAM: ABDOMEN - 1 VIEW COMPARISON:  Abdominal x-ray 03/19/2023 FINDINGS: Nasogastric tube tip is now in the distal body of the stomach. IMPRESSION: Nasogastric tube tip is now in the distal body of the stomach. Electronically Signed   By: Darliss Cheney M.D.   On: 03/19/2023 23:43   DG Abd Portable 1 View Result Date: 03/19/2023 CLINICAL  DATA:  Confirm NG tube placement EXAM: PORTABLE ABDOMEN - 1 VIEW COMPARISON:  CT 03/19/2023 FINDINGS: NG tube tip is in the distal esophagus. Recommend advancing approximately 9 cm. Visualized lung bases clear. IMPRESSION: NG tube tip in the distal esophagus. Recommend advancing approximately 9 cm. Electronically Signed   By: Charlett Nose M.D.   On: 03/19/2023 19:03   DG Chest Port 1 View Result Date: 03/19/2023 CLINICAL DATA:  Right chest pain. EXAM: PORTABLE CHEST 1 VIEW COMPARISON:  07/20/2021.  CT 09/20/2022 FINDINGS: There is hyperinflation of the lungs compatible with COPD. NG tube enters the stomach. Heart and mediastinal contours within normal limits. Aortic calcifications. Biapical scarring. No acute confluent airspace opacities or effusions. No acute bony abnormality. IMPRESSION: COPD/chronic changes. No active disease. Electronically Signed   By: Charlett Nose M.D.   On: 03/19/2023 19:02   CT ABDOMEN PELVIS W CONTRAST Result Date: 03/19/2023 CLINICAL DATA:  Abdominal pain, acute, nonlocalized r/o sbo or other acute process. EXAM: CT ABDOMEN AND PELVIS WITH CONTRAST TECHNIQUE: Multidetector CT imaging of the abdomen and pelvis was performed using the standard protocol following bolus administration of intravenous contrast. RADIATION DOSE REDUCTION: This exam was performed according to the departmental dose-optimization program  which includes automated exposure control, adjustment of the mA and/or kV according to patient size and/or use of iterative reconstruction technique. CONTRAST:  OMNIPAQUE IOHEXOL 300 MG/ML  SOLN COMPARISON:  CT scan abdomen and pelvis from 05/20/2017. FINDINGS: Lower chest: Bilateral peripheral/subpleural reticulations and honeycombing noted. Findings favor chronic interstitial lung disease. The lung bases are otherwise clear. No pleural effusion. The heart is normal in size. No pericardial effusion. Hepatobiliary: The liver is normal in size. Non-cirrhotic configuration.  No suspicious mass. There is mild diffuse hepatic steatosis. There are several subcentimeter sized hypoattenuating foci throughout the liver, which are too small to adequately characterize but present since the prior study and favored to represent cysts. No intrahepatic or extrahepatic bile duct dilation. There is a single 1.2 x 1.3 cm gallstone without imaging signs of acute cholecystitis. Normal gallbladder wall thickness. No pericholecystic inflammatory changes. Pancreas: Small/atrophic pancreas. No focal mass or peripancreatic fat stranding. Main pancreatic duct is not dilated. Spleen: Within normal limits. No focal lesion. Adrenals/Urinary Tract: Stable nodularity of bilateral adrenal glands, with left adrenal gland measuring 1.2 x 1.4 cm and right adrenal gland measuring 1.2 x 1.6 cm. No suspicious renal mass. No hydroureteronephrosis. There are linear calcifications in the bilateral kidneys, which are favored to represent vascular calcifications. No nephroureterolithiasis. Unremarkable urinary bladder. Stomach/Bowel: Stomach, duodenum and proximal jejunal loops are nondilated. However, there is gradual dilation of the distal small bowel loops measuring up to 3.8 cm in diameter. There is point of transition in the midline lower abdomen (series 8, image 30 and series 2, image 54), proximal to which, there is short segment of fecalized distal small bowel. Distal to this point, there are several collapsed distal ileal loops which exhibit diffuse wall thickening and surrounding fat stranding. The ileocecal junction is in the right lower quadrant. The colon is nondilated. Proximal to the above described transition point there are multiple dilated bowel loops exhibiting mild surrounding fat stranding as well as mesenteric fat stranding and inter bowel free fluid. Findings favor high-grade small-bowel obstruction probably secondary to adhesions and associated with enteritis. No pneumatosis, pneumoperitoneum or portal  venous gas. No walled-off abscess or loculated collection. Vascular/Lymphatic: No abdominal or pelvic lymphadenopathy, by size criteria. No aneurysmal dilation of the major abdominal arteries. There are moderate peripheral atherosclerotic vascular calcifications of the aorta and its major branches. Reproductive: The uterus is surgically absent. No large adnexal mass. Other: The visualized soft tissues and abdominal wall are unremarkable. Musculoskeletal: No suspicious osseous lesions. There are mild multilevel degenerative changes in the visualized spine. IMPRESSION: 1. Findings favor high-grade small-bowel obstruction with transition point in the midline lower abdomen, associated with enteritis, as described in detail above. No pneumatosis, pneumoperitoneum or portal venous gas. No walled-off abscess or loculated collection. 2. Multiple other nonacute observations (such as chronic interstitial lung disease in the visualized bilateral lung bases, diffuse hepatic steatosis, multiple liver cysts, stable nodularity of bilateral adrenal glands, etc.), As described above. Aortic Atherosclerosis (ICD10-I70.0). Electronically Signed   By: Jules Schick M.D.   On: 03/19/2023 14:57    Scheduled Meds:  enoxaparin (LOVENOX) injection  40 mg Subcutaneous QHS   nicotine  7 mg Transdermal Daily   ondansetron (ZOFRAN) IV  4 mg Intravenous Once   Continuous Infusions:  sodium chloride       LOS: 1 day    Alwyn Ren, MD  03/20/2023, 1:45 PM

## 2023-03-21 DIAGNOSIS — K56609 Unspecified intestinal obstruction, unspecified as to partial versus complete obstruction: Secondary | ICD-10-CM | POA: Diagnosis not present

## 2023-03-21 NOTE — Progress Notes (Signed)
 PROGRESS NOTE  Rachel Bryan VWU:[id number] DOB: 1950-08-09 DOA: 03/19/2023 PCP: Sharmon Revere, MD   LOS: 2 days   Brief Narrative / Interim history: 73 year old female with history of appendicectomy, hysterectomy, ongoing tobacco use comes into the hospital with nausea, vomiting, found to have small bowel obstruction.  Surgery consulted  Subjective / 24h Interval events: She is feeling better, NG tube discontinued this morning  Assesement and Plan: Principal Problem:   SBO (small bowel obstruction) (HCC) Active Problems:   Anxiety and depression   Hypercholesterolemia   Tobacco abuse   Coronary artery calcification   COPD (chronic obstructive pulmonary disease) (HCC)  Principal problem Small bowel obstruction -patient presented to the hospital with nausea, vomiting.  Imaging in the ER showed high-grade small bowel obstruction with transition point in the midline lower abdomen, associated with enteritis.  General surgery consulted and patient has been treated conservatively with n.p.o., IV fluids, NG tube -Upon improvement, NG tube discontinued 2/26 -Slowly advance diet  Active problems History of COPD and tobacco abuse -respiratory status stable, no wheezing, continue nicotine patch.  Patient counseled for cessation  Anxiety and depression - restart BuSpar once she is able to take p.o.   Hyperlipidemia - on Crestor  Scheduled Meds:  enoxaparin (LOVENOX) injection  40 mg Subcutaneous QHS   nicotine  7 mg Transdermal Daily   ondansetron (ZOFRAN) IV  4 mg Intravenous Once   Continuous Infusions:  sodium chloride 100 mL/hr at 03/20/23 1425   PRN Meds:.acetaminophen **OR** acetaminophen, HYDROmorphone (DILAUDID) injection, ondansetron **OR** ondansetron (ZOFRAN) IV, phenol  Current Outpatient Medications  Medication Instructions   albuterol (VENTOLIN HFA) 108 (90 Base) MCG/ACT inhaler INHALE 2 PUFFS INTO THE LUNGS EVERY 6 HOURS AS NEEDED FOR WHEEZING OR SHORTNESS OF  BREATH   ALPRAZolam (XANAX) 0.5 mg, Daily PRN   aspirin EC 81 mg, Oral, Daily, Swallow whole.   busPIRone (BUSPAR) 5 MG tablet TAKE 1 TABLET(5 MG) BY MOUTH THREE TIMES DAILY   gabapentin (NEURONTIN) 100 mg, Oral, 2 times daily   meclizine (ANTIVERT) 12.5 MG tablet TAKE 1 TABLET(12.5 MG) BY MOUTH TWICE DAILY AS NEEDED FOR DIZZINESS   metoprolol tartrate (LOPRESSOR) 50 MG tablet Take 50 mg (1 tablet) TWO hours prior to CT scan   mirabegron ER (MYRBETRIQ) 50 mg, Oral, Daily   Naproxen 375 mg, Oral, Every 8 hours PRN, For headache   nicotine (NICODERM CQ - DOSED IN MG/24 HOURS) 14 mg, Transdermal, Daily   nicotine polacrilex (NICORETTE) 2 mg, Oral, As needed   nitroGLYCERIN (NITROSTAT) 0.4 mg, Sublingual, Every 5 min PRN   pravastatin (PRAVACHOL) 40 mg, Daily   rosuvastatin (CRESTOR) 40 mg, Oral, Daily    Diet Orders (From admission, onward)     Start     Ordered   03/20/23 1113  Diet NPO time specified Except for: [employer], Sips with Meds  Diet effective now       Question Answer Comment  Except for Ice Chips   Except for Sips with Meds      03/20/23 1113            DVT prophylaxis: enoxaparin (LOVENOX) injection 40 mg Start: 03/19/23 2200   Lab Results  Component Value Date   PLT 235 03/20/2023      Code Status: Full Code  Family Communication: No family at bedside  Status is: Inpatient Remains inpatient appropriate because: severity of illness  Level of care: Med-Surg  Consultants:  General surgery  Objective: Vitals:   03/20/23 1400 03/20/23  2035 03/21/23 0456 03/21/23 1024  BP: (!) 148/80 (!) 150/57 (!) 126/57 (!) 125/58  Pulse: 75 80 74 67  Resp: 17 17 15 16   Temp: (!) 97.3 F (36.3 C) 98.4 F (36.9 C) 98.3 F (36.8 C) 98.2 F (36.8 C)  TempSrc: Oral Oral Oral Oral  SpO2: 95% 90% 92% 94%  Weight:      Height:        Intake/Output Summary (Last 24 hours) at 03/21/2023 1116 Last data filed at 03/21/2023 1000 Gross per 24 hour  Intake 878.36 ml   Output 1275 ml  Net -396.64 ml   Wt Readings from Last 3 Encounters:  03/19/23 63 kg  11/14/21 63 kg  08/11/21 63 kg    Examination:  Constitutional: NAD Eyes: no scleral icterus ENMT: Mucous membranes are moist.  Neck: normal, supple Respiratory: clear to auscultation bilaterally, no wheezing, no crackles. Cardiovascular: Regular rate and rhythm, no murmurs / rubs / gallops. No LE edema.  Abdomen: non distended, no tenderness. Bowel sounds positive.  Musculoskeletal: no clubbing / cyanosis.   Data Reviewed: I have independently reviewed following labs and imaging studies   CBC Recent Labs  Lab 03/19/23 1045 03/20/23 0445  WBC 10.0 7.6  HGB 12.7 12.2  HCT 40.1 39.7  PLT 266 235  MCV 90.1 94.3  MCH 28.5 29.0  MCHC 31.7 30.7  RDW 12.8 13.1    Recent Labs  Lab 03/19/23 1045 03/20/23 0445  NA 140 144  K 4.0 4.2  CL 107 110  CO2 24 25  GLUCOSE 117* 119*  BUN 21 18  CREATININE 0.75 0.83  CALCIUM 9.6 9.1  AST 17 14*  ALT 11 8  ALKPHOS 57 49  BILITOT 0.5 0.6  ALBUMIN 3.7 3.2*  MG  --  2.1    ------------------------------------------------------------------------------------------------------------------ No results for input(s): "CHOL", "HDL", "LDLCALC", "TRIG", "CHOLHDL", "LDLDIRECT" in the last 72 hours.  No results found for: "HGBA1C" ------------------------------------------------------------------------------------------------------------------ No results for input(s): "TSH", "T4TOTAL", "T3FREE", "THYROIDAB" in the last 72 hours.  Invalid input(s): "FREET3"  Cardiac Enzymes No results for input(s): "CKMB", "TROPONINI", "MYOGLOBIN" in the last 168 hours.  Invalid input(s): "CK" ------------------------------------------------------------------------------------------------------------------ No results found for: "BNP"  CBG: No results for input(s): "GLUCAP" in the last 168 hours.  No results found for this or any previous visit (from the past  240 hours).   Radiology Studies: DG Abd Portable 1V-Small Bowel Obstruction Protocol-initial, 8 hr delay Result Date: 03/20/2023 CLINICAL DATA:  8 hours post contrast.  Small-bowel protocol. EXAM: PORTABLE ABDOMEN - 1 VIEW COMPARISON:  Abdominal x-ray 03/20/2023. CT abdomen and pelvis 05/13/2023 FINDINGS: Oral contrast is seen throughout nondilated colon to the level of the rectum. Dilated mid small bowel loops persist. Enteric tube tip is in the distal body of the stomach. IMPRESSION: 1. Dilated mid small bowel loops persist which may be related to ileus or partial small bowel obstruction. Oral contrast is seen to the level of the rectum. Electronically Signed   By: Darliss Cheney M.D.   On: 03/20/2023 23:49     Pamella Pert, MD, PhD [facility]  Between 7 am - 7 pm I am available, please contact me via Amion (for emergencies) or Securechat (non urgent messages)  Between 7 pm - 7 am I am not available, please contact night coverage MD/APP via Amion

## 2023-03-21 NOTE — Progress Notes (Signed)
 [facility] Progress Note     Subjective: CC:  Feeling better. Denies abd pain. BMx5  Objective: Vital signs in last 24 hours: Temp:  [97.3 F (36.3 C)-98.4 F (36.9 C)] 98.3 F (36.8 C) (02/26 0456) Pulse Rate:  [74-80] 74 (02/26 0456) Resp:  [15-17] 15 (02/26 0456) BP: (126-150)/(57-80) 126/57 (02/26 0456) SpO2:  [90 %-95 %] 92 % (02/26 0456) Last BM Date : 03/20/23  Intake/Output from previous day: 02/25 0701 - 02/26 0700 In: 1430 [P.O.:180; I.V.:1250] Out: 1675 [Urine:650; Emesis/NG output:1025] Intake/Output this shift: No intake/output data recorded.  PE: Gen:  Alert, NAD, pleasant Card:  Regular rate and rhythm, pedal pulses 2+ BL Pulm:  Normal effort, clear to auscultation bilaterally Abd: Soft, nontender, +BS Skin: warm and dry, no rashes  Psych: A&Ox3   Lab Results:  Recent Labs    03/19/23 1045 03/20/23 0445  WBC 10.0 7.6  HGB 12.7 12.2  HCT 40.1 39.7  PLT 266 235   BMET Recent Labs    03/19/23 1045 03/20/23 0445  NA 140 144  K 4.0 4.2  CL 107 110  CO2 24 25  GLUCOSE 117* 119*  BUN 21 18  CREATININE 0.75 0.83  CALCIUM 9.6 9.1   PT/INR No results for input(s): "LABPROT", "INR" in the last 72 hours. CMP     Component Value Date/Time   NA 144 03/20/2023 0445   NA 142 07/07/2021 1203   K 4.2 03/20/2023 0445   CL 110 03/20/2023 0445   CO2 25 03/20/2023 0445   GLUCOSE 119 (H) 03/20/2023 0445   BUN 18 03/20/2023 0445   BUN 14 07/07/2021 1203   CREATININE 0.83 03/20/2023 0445   CREATININE 0.85 03/24/2015 1547   CALCIUM 9.1 03/20/2023 0445   PROT 6.1 (L) 03/20/2023 0445   PROT 7.0 07/14/2020 1339   ALBUMIN 3.2 (L) 03/20/2023 0445   ALBUMIN 4.4 07/14/2020 1339   AST 14 (L) 03/20/2023 0445   ALT 8 03/20/2023 0445   ALKPHOS 49 03/20/2023 0445   BILITOT 0.6 03/20/2023 0445   BILITOT 0.4 07/14/2020 1339   GFRNONAA >60 03/20/2023 0445   GFRNONAA 73 03/24/2015 1547   GFRAA >60 05/25/2017 0829   GFRAA 84 03/24/2015 1547    Lipase     Component Value Date/Time   LIPASE 33 03/19/2023 1045       Studies/Results: DG Abd Portable 1V-Small Bowel Obstruction Protocol-initial, 8 hr delay Result Date: 03/20/2023 CLINICAL DATA:  8 hours post contrast.  Small-bowel protocol. EXAM: PORTABLE ABDOMEN - 1 VIEW COMPARISON:  Abdominal x-ray 03/20/2023. CT abdomen and pelvis 05/13/2023 FINDINGS: Oral contrast is seen throughout nondilated colon to the level of the rectum. Dilated mid small bowel loops persist. Enteric tube tip is in the distal body of the stomach. IMPRESSION: 1. Dilated mid small bowel loops persist which may be related to ileus or partial small bowel obstruction. Oral contrast is seen to the level of the rectum. Electronically Signed   By: Darliss Cheney M.D.   On: 03/20/2023 23:49   DG Abd 1 View Result Date: 03/20/2023 CLINICAL DATA:  Abdominal pain EXAM: ABDOMEN - 1 VIEW COMPARISON:  the previous day's study FINDINGS: Gastric tube stable in the decompressed stomach. A few filled mid abdominal small bowel loops. The colon is nondilated.Aortoiliac calcified plaque. Dilute contrast material in the urinary bladder. 1.4 cm lamellated gallstone as before. IMPRESSION: 1. Nonobstructive bowel gas pattern. 2. Cholelithiasis. Electronically Signed   By: Corlis Leak M.D.   On: 03/20/2023 12:48  DG Abd 1 View Result Date: 03/19/2023 CLINICAL DATA:  Nasogastric tube status post advancement. EXAM: ABDOMEN - 1 VIEW COMPARISON:  Abdominal x-ray 03/19/2023 FINDINGS: Nasogastric tube tip is now in the distal body of the stomach. IMPRESSION: Nasogastric tube tip is now in the distal body of the stomach. Electronically Signed   By: Darliss Cheney M.D.   On: 03/19/2023 23:43   DG Abd Portable 1 View Result Date: 03/19/2023 CLINICAL DATA:  Confirm NG tube placement EXAM: PORTABLE ABDOMEN - 1 VIEW COMPARISON:  CT 03/19/2023 FINDINGS: NG tube tip is in the distal esophagus. Recommend advancing approximately 9 cm. Visualized lung  bases clear. IMPRESSION: NG tube tip in the distal esophagus. Recommend advancing approximately 9 cm. Electronically Signed   By: Charlett Nose M.D.   On: 03/19/2023 19:03   DG Chest Port 1 View Result Date: 03/19/2023 CLINICAL DATA:  Right chest pain. EXAM: PORTABLE CHEST 1 VIEW COMPARISON:  07/20/2021.  CT 09/20/2022 FINDINGS: There is hyperinflation of the lungs compatible with COPD. NG tube enters the stomach. Heart and mediastinal contours within normal limits. Aortic calcifications. Biapical scarring. No acute confluent airspace opacities or effusions. No acute bony abnormality. IMPRESSION: COPD/chronic changes. No active disease. Electronically Signed   By: Charlett Nose M.D.   On: 03/19/2023 19:02   CT ABDOMEN PELVIS W CONTRAST Result Date: 03/19/2023 CLINICAL DATA:  Abdominal pain, acute, nonlocalized r/o sbo or other acute process. EXAM: CT ABDOMEN AND PELVIS WITH CONTRAST TECHNIQUE: Multidetector CT imaging of the abdomen and pelvis was performed using the standard protocol following bolus administration of intravenous contrast. RADIATION DOSE REDUCTION: This exam was performed according to the departmental dose-optimization program which includes automated exposure control, adjustment of the mA and/or kV according to patient size and/or use of iterative reconstruction technique. CONTRAST:  OMNIPAQUE IOHEXOL 300 MG/ML  SOLN COMPARISON:  CT scan abdomen and pelvis from 05/20/2017. FINDINGS: Lower chest: Bilateral peripheral/subpleural reticulations and honeycombing noted. Findings favor chronic interstitial lung disease. The lung bases are otherwise clear. No pleural effusion. The heart is normal in size. No pericardial effusion. Hepatobiliary: The liver is normal in size. Non-cirrhotic configuration. No suspicious mass. There is mild diffuse hepatic steatosis. There are several subcentimeter sized hypoattenuating foci throughout the liver, which are too small to adequately characterize but  present since the prior study and favored to represent cysts. No intrahepatic or extrahepatic bile duct dilation. There is a single 1.2 x 1.3 cm gallstone without imaging signs of acute cholecystitis. Normal gallbladder wall thickness. No pericholecystic inflammatory changes. Pancreas: Small/atrophic pancreas. No focal mass or peripancreatic fat stranding. Main pancreatic duct is not dilated. Spleen: Within normal limits. No focal lesion. Adrenals/Urinary Tract: Stable nodularity of bilateral adrenal glands, with left adrenal gland measuring 1.2 x 1.4 cm and right adrenal gland measuring 1.2 x 1.6 cm. No suspicious renal mass. No hydroureteronephrosis. There are linear calcifications in the bilateral kidneys, which are favored to represent vascular calcifications. No nephroureterolithiasis. Unremarkable urinary bladder. Stomach/Bowel: Stomach, duodenum and proximal jejunal loops are nondilated. However, there is gradual dilation of the distal small bowel loops measuring up to 3.8 cm in diameter. There is point of transition in the midline lower abdomen (series 8, image 30 and series 2, image 54), proximal to which, there is short segment of fecalized distal small bowel. Distal to this point, there are several collapsed distal ileal loops which exhibit diffuse wall thickening and surrounding fat stranding. The ileocecal junction is in the right lower quadrant. The colon is  nondilated. Proximal to the above described transition point there are multiple dilated bowel loops exhibiting mild surrounding fat stranding as well as mesenteric fat stranding and inter bowel free fluid. Findings favor high-grade small-bowel obstruction probably secondary to adhesions and associated with enteritis. No pneumatosis, pneumoperitoneum or portal venous gas. No walled-off abscess or loculated collection. Vascular/Lymphatic: No abdominal or pelvic lymphadenopathy, by size criteria. No aneurysmal dilation of the major abdominal arteries.  There are moderate peripheral atherosclerotic vascular calcifications of the aorta and its major branches. Reproductive: The uterus is surgically absent. No large adnexal mass. Other: The visualized soft tissues and abdominal wall are unremarkable. Musculoskeletal: No suspicious osseous lesions. There are mild multilevel degenerative changes in the visualized spine. IMPRESSION: 1. Findings favor high-grade small-bowel obstruction with transition point in the midline lower abdomen, associated with enteritis, as described in detail above. No pneumatosis, pneumoperitoneum or portal venous gas. No walled-off abscess or loculated collection. 2. Multiple other nonacute observations (such as chronic interstitial lung disease in the visualized bilateral lung bases, diffuse hepatic steatosis, multiple liver cysts, stable nodularity of bilateral adrenal glands, etc.), As described above. Aortic Atherosclerosis (ICD10-I70.0). Electronically Signed   By: Jules Schick M.D.   On: 03/19/2023 14:57    Anti-infectives: Anti-infectives (From admission, onward)    None        Assessment/Plan  SBO, likely due to adhesions - SBO protocol 2/25 >> contrast in the colon on X-ray. Now having BMs. - D/C NGT and start CLD, advance as tolerated to soft - no emergent surgical needs. Improving non-operatively   LOS: 2 days   I reviewed nursing notes, hospitalist notes, last 24 h vitals and pain scores, last 48 h intake and output, last 24 h labs and trends, and last 24 h imaging results.  This care required moderate level of medical decision making.   Hosie Spangle, PA-C [facility] Please see Amion for pager number during day hours 7:00am-4:30pm

## 2023-03-22 DIAGNOSIS — K56609 Unspecified intestinal obstruction, unspecified as to partial versus complete obstruction: Secondary | ICD-10-CM | POA: Diagnosis not present

## 2023-03-22 MED ORDER — MECLIZINE HCL 12.5 MG PO TABS: 12.5000 mg | ORAL_TABLET | Freq: Two times a day (BID) | ORAL | Status: AC | PRN

## 2023-03-22 MED ORDER — BUSPIRONE HCL 5 MG PO TABS: 5.0000 mg | ORAL_TABLET | Freq: Three times a day (TID) | ORAL | Status: AC

## 2023-03-22 NOTE — Plan of Care (Signed)

## 2023-03-22 NOTE — Progress Notes (Signed)
 Continues to improve. Tolerating soft diet Having bowel function Abdominal pain nearly resolved.  Stable for discharge from [facility] standpoint. Her daughter can pick her up today. We discussed the cause of SBO. We discussed diet. We discussed return precautions.   Hosie Spangle, PA-C [facility] Please see Amion for pager number during day hours 7:00am-4:30pm

## 2023-03-22 NOTE — Discharge Summary (Signed)
 Physician Discharge Summary  MAKENLEIGH CROWNOVER ZOX:[id number] DOB: [birth year] DOA: 03/19/2023  PCP: Sharmon Revere, MD  Admit date: 03/19/2023 Discharge date: 03/22/2023  Admitted From: home Disposition:  home  Recommendations for Outpatient Follow-up:  Follow up with PCP in 1-2 weeks  Home Health: none Equipment/Devices: none  Discharge Condition: stable CODE STATUS: Full code Diet Orders (From admission, onward)     Start     Ordered   03/22/23 0802  DIET SOFT Room service appropriate? Yes; Fluid consistency: Thin  Diet effective now       Question Answer Comment  Room service appropriate? Yes   Fluid consistency: Thin      03/22/23 0801            HPI: Per admitting MD, Rachel Bryan is a 73 y.o. 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  No diarrhea, melena or hematochezia. No flank pain, dysuria, frequency or hematuria. She denied fever, chills, rhinorrhea, sore throat, wheezing or hemoptysis. No chest pain, palpitations, diaphoresis, PND, orthopnea or pitting edema of the lower extremities. No polyuria, polydipsia, polyphagia or blurred vision.   Hospital Course / Discharge diagnoses: Principal Problem:   SBO (small bowel obstruction) (HCC) Active Problems:   Anxiety and depression   Hypercholesterolemia   Tobacco abuse   Coronary artery calcification   COPD (chronic obstructive pulmonary disease) (HCC)   Principal problem Small bowel obstruction -patient presented to the hospital with nausea, vomiting.  Imaging  in the ER showed high-grade small bowel obstruction with transition point in the midline lower abdomen, associated with enteritis.  General surgery consulted and patient has been treated conservatively with n.p.o., IV fluids, NG tube, with improvement in her clinical condition and NG tube was discontinued 2/26.  Her diet was advanced, now able to tolerate a regular diet, feels back to baseline and will be discharged home in stable condition.   Active problems History of COPD and tobacco abuse -respiratory status stable, no wheezing, continue nicotine patch.  Patient counseled for cessation Anxiety and depression -continue home medications on discharge  Hyperlipidemia - on statin  Sepsis ruled out   Discharge Instructions   Allergies as of 03/22/2023       Reactions   Levofloxacin Rash, Other (See Comments)   "Burning veins," per the patient- also   Meperidine Hives, Rash   Morphine And Codeine Hives, Rash, Other (See Comments)   Hallucinations and sweating- also   Penicillins Hives, Palpitations, Rash   Buprenorphine Hcl Other (See Comments)   Hallucinations   Sulfa Antibiotics Hives   Codeine Hives, Other (See Comments)   Sweating, too        Medication List     STOP taking these medications    metoprolol tartrate 50 MG tablet Commonly known as: LOPRESSOR   rosuvastatin 40 MG tablet Commonly known as: Crestor       TAKE these medications    albuterol 108 (90 Base) MCG/ACT inhaler Commonly known as: VENTOLIN HFA INHALE 2 PUFFS INTO THE LUNGS EVERY 6 HOURS AS NEEDED FOR WHEEZING OR SHORTNESS OF BREATH   ALPRAZolam 0.5 MG tablet Commonly known as: XANAX Take 0.5 mg by mouth daily as needed.  aspirin EC 81 MG tablet Take 1 tablet (81 mg total) by mouth daily. Swallow whole.   busPIRone 5 MG tablet Commonly known as: BUSPAR TAKE 1 TABLET(5 MG) BY MOUTH THREE TIMES DAILY What changed: See the new instructions.   gabapentin 100 MG capsule Commonly known as:  NEURONTIN Take 1 capsule (100 mg total) by mouth 2 (two) times daily.   meclizine 12.5 MG tablet Commonly known as: ANTIVERT TAKE 1 TABLET(12.5 MG) BY MOUTH TWICE DAILY AS NEEDED FOR DIZZINESS What changed: See the new instructions.   mirabegron ER 50 MG Tb24 tablet Commonly known as: MYRBETRIQ Take 1 tablet (50 mg total) by mouth daily.   nicotine 14 mg/24hr patch Commonly known as: NICODERM CQ - dosed in mg/24 hours Place 1 patch (14 mg total) onto the skin daily.   nicotine polacrilex 2 MG gum Commonly known as: NICORETTE Take 1 each (2 mg total) by mouth as needed for smoking cessation.   nitroGLYCERIN 0.4 MG SL tablet Commonly known as: Nitrostat Place 1 tablet (0.4 mg total) under the tongue every 5 (five) minutes as needed for chest pain.   pravastatin 40 MG tablet Commonly known as: PRAVACHOL Take 40 mg by mouth daily.   tiZANidine 4 MG tablet Commonly known as: ZANAFLEX Take 4 mg by mouth every 6 (six) hours as needed for muscle spasms.       Consultations: General Surgery  Procedures/Studies:  DG Abd Portable 1V-Small Bowel Obstruction Protocol-initial, 8 hr delay Result Date: 03/20/2023 CLINICAL DATA:  8 hours post contrast.  Small-bowel protocol. EXAM: PORTABLE ABDOMEN - 1 VIEW COMPARISON:  Abdominal x-ray 03/20/2023. CT abdomen and pelvis 05/13/2023 FINDINGS: Oral contrast is seen throughout nondilated colon to the level of the rectum. Dilated mid small bowel loops persist. Enteric tube tip is in the distal body of the stomach. IMPRESSION: 1. Dilated mid small bowel loops persist which may be related to ileus or partial small bowel obstruction. Oral contrast is seen to the level of the rectum. Electronically Signed   By: Darliss Cheney M.D.   On: 03/20/2023 23:49   DG Abd 1 View Result Date: 03/20/2023 CLINICAL DATA:  Abdominal pain EXAM: ABDOMEN - 1 VIEW COMPARISON:  the previous day's study FINDINGS: Gastric tube stable in the decompressed stomach. A few  filled mid abdominal small bowel loops. The colon is nondilated.Aortoiliac calcified plaque. Dilute contrast material in the urinary bladder. 1.4 cm lamellated gallstone as before. IMPRESSION: 1. Nonobstructive bowel gas pattern. 2. Cholelithiasis. Electronically Signed   By: Corlis Leak M.D.   On: 03/20/2023 12:48   DG Abd 1 View Result Date: 03/19/2023 CLINICAL DATA:  Nasogastric tube status post advancement. EXAM: ABDOMEN - 1 VIEW COMPARISON:  Abdominal x-ray 03/19/2023 FINDINGS: Nasogastric tube tip is now in the distal body of the stomach. IMPRESSION: Nasogastric tube tip is now in the distal body of the stomach. Electronically Signed   By: Darliss Cheney M.D.   On: 03/19/2023 23:43   DG Abd Portable 1 View Result Date: 03/19/2023 CLINICAL DATA:  Confirm NG tube placement EXAM: PORTABLE ABDOMEN - 1 VIEW COMPARISON:  CT 03/19/2023 FINDINGS: NG tube tip is in the distal esophagus. Recommend advancing approximately 9 cm. Visualized lung bases clear. IMPRESSION: NG tube tip in the distal esophagus. Recommend advancing approximately 9 cm. Electronically Signed   By: Charlett Nose M.D.   On: 03/19/2023 19:03   DG Chest Port 1 View Result Date: 03/19/2023 CLINICAL DATA:  Right chest pain. EXAM: PORTABLE CHEST 1 VIEW  COMPARISON:  07/20/2021.  CT 09/20/2022 FINDINGS: There is hyperinflation of the lungs compatible with COPD. NG tube enters the stomach. Heart and mediastinal contours within normal limits. Aortic calcifications. Biapical scarring. No acute confluent airspace opacities or effusions. No acute bony abnormality. IMPRESSION: COPD/chronic changes. No active disease. Electronically Signed   By: Charlett Nose M.D.   On: 03/19/2023 19:02   CT ABDOMEN PELVIS W CONTRAST Result Date: 03/19/2023 CLINICAL DATA:  Abdominal pain, acute, nonlocalized r/o sbo or other acute process. EXAM: CT ABDOMEN AND PELVIS WITH CONTRAST TECHNIQUE: Multidetector CT imaging of the abdomen and pelvis was performed using the  standard protocol following bolus administration of intravenous contrast. RADIATION DOSE REDUCTION: This exam was performed according to the departmental dose-optimization program which includes automated exposure control, adjustment of the mA and/or kV according to patient size and/or use of iterative reconstruction technique. CONTRAST:  OMNIPAQUE IOHEXOL 300 MG/ML  SOLN COMPARISON:  CT scan abdomen and pelvis from 05/20/2017. FINDINGS: Lower chest: Bilateral peripheral/subpleural reticulations and honeycombing noted. Findings favor chronic interstitial lung disease. The lung bases are otherwise clear. No pleural effusion. The heart is normal in size. No pericardial effusion. Hepatobiliary: The liver is normal in size. Non-cirrhotic configuration. No suspicious mass. There is mild diffuse hepatic steatosis. There are several subcentimeter sized hypoattenuating foci throughout the liver, which are too small to adequately characterize but present since the prior study and favored to represent cysts. No intrahepatic or extrahepatic bile duct dilation. There is a single 1.2 x 1.3 cm gallstone without imaging signs of acute cholecystitis. Normal gallbladder wall thickness. No pericholecystic inflammatory changes. Pancreas: Small/atrophic pancreas. No focal mass or peripancreatic fat stranding. Main pancreatic duct is not dilated. Spleen: Within normal limits. No focal lesion. Adrenals/Urinary Tract: Stable nodularity of bilateral adrenal glands, with left adrenal gland measuring 1.2 x 1.4 cm and right adrenal gland measuring 1.2 x 1.6 cm. No suspicious renal mass. No hydroureteronephrosis. There are linear calcifications in the bilateral kidneys, which are favored to represent vascular calcifications. No nephroureterolithiasis. Unremarkable urinary bladder. Stomach/Bowel: Stomach, duodenum and proximal jejunal loops are nondilated. However, there is gradual dilation of the distal small bowel loops measuring up to  3.8 cm in diameter. There is point of transition in the midline lower abdomen (series 8, image 30 and series 2, image 54), proximal to which, there is short segment of fecalized distal small bowel. Distal to this point, there are several collapsed distal ileal loops which exhibit diffuse wall thickening and surrounding fat stranding. The ileocecal junction is in the right lower quadrant. The colon is nondilated. Proximal to the above described transition point there are multiple dilated bowel loops exhibiting mild surrounding fat stranding as well as mesenteric fat stranding and inter bowel free fluid. Findings favor high-grade small-bowel obstruction probably secondary to adhesions and associated with enteritis. No pneumatosis, pneumoperitoneum or portal venous gas. No walled-off abscess or loculated collection. Vascular/Lymphatic: No abdominal or pelvic lymphadenopathy, by size criteria. No aneurysmal dilation of the major abdominal arteries. There are moderate peripheral atherosclerotic vascular calcifications of the aorta and its major branches. Reproductive: The uterus is surgically absent. No large adnexal mass. Other: The visualized soft tissues and abdominal wall are unremarkable. Musculoskeletal: No suspicious osseous lesions. There are mild multilevel degenerative changes in the visualized spine. IMPRESSION: 1. Findings favor high-grade small-bowel obstruction with transition point in the midline lower abdomen, associated with enteritis, as described in detail above. No pneumatosis, pneumoperitoneum or portal venous gas. No walled-off abscess or loculated collection.  2. Multiple other nonacute observations (such as chronic interstitial lung disease in the visualized bilateral lung bases, diffuse hepatic steatosis, multiple liver cysts, stable nodularity of bilateral adrenal glands, etc.), As described above. Aortic Atherosclerosis (ICD10-I70.0). Electronically Signed   By: Jules Schick M.D.   On:  03/19/2023 14:57   Subjective: - no chest pain, shortness of breath, no abdominal pain, nausea or vomiting.   Discharge Exam: BP (!) 151/67 (BP Location: Right Arm)   Pulse 71   Temp 97.8 F (36.6 C) (Oral)   Resp 15   Ht 5\' 7"  (1.702 m)   Wt 63 kg   SpO2 97%   BMI 21.75 kg/m   General: Pt is alert, awake, not in acute distress Cardiovascular: RRR, S1/S2 +, no rubs, no gallops Respiratory: CTA bilaterally, no wheezing, no rhonchi Abdominal: Soft, NT, ND, bowel sounds + Extremities: no edema, no cyanosis    The results of significant diagnostics from this hospitalization (including imaging, microbiology, ancillary and laboratory) are listed below for reference.     Microbiology: No results found for this or any previous visit (from the past 240 hours).   Labs: Basic Metabolic Panel: Recent Labs  Lab 03/19/23 1045 03/20/23 0445  NA 140 144  K 4.0 4.2  CL 107 110  CO2 24 25  GLUCOSE 117* 119*  BUN 21 18  CREATININE 0.75 0.83  CALCIUM 9.6 9.1  MG  --  2.1  PHOS  --  2.8   Liver Function Tests: Recent Labs  Lab 03/19/23 1045 03/20/23 0445  AST 17 14*  ALT 11 8  ALKPHOS 57 49  BILITOT 0.5 0.6  PROT 6.9 6.1*  ALBUMIN 3.7 3.2*   CBC: Recent Labs  Lab 03/19/23 1045 03/20/23 0445  WBC 10.0 7.6  HGB 12.7 12.2  HCT 40.1 39.7  MCV 90.1 94.3  PLT 266 235   CBG: No results for input(s): "GLUCAP" in the last 168 hours. Hgb A1c No results for input(s): "HGBA1C" in the last 72 hours. Lipid Profile No results for input(s): "CHOL", "HDL", "LDLCALC", "TRIG", "CHOLHDL", "LDLDIRECT" in the last 72 hours. Thyroid function studies No results for input(s): "TSH", "T4TOTAL", "T3FREE", "THYROIDAB" in the last 72 hours.  Invalid input(s): "FREET3" Urinalysis    Component Value Date/Time   COLORURINE YELLOW 03/19/2023 0942   APPEARANCEUR HAZY (A) 03/19/2023 0942   LABSPEC 1.018 03/19/2023 0942   PHURINE 6.0 03/19/2023 0942   GLUCOSEU NEGATIVE 03/19/2023 0942    HGBUR MODERATE (A) 03/19/2023 0942   BILIRUBINUR NEGATIVE 03/19/2023 0942   BILIRUBINUR NEG 12/25/2012 1503   KETONESUR NEGATIVE 03/19/2023 0942   PROTEINUR NEGATIVE 03/19/2023 0942   UROBILINOGEN 0.2 04/27/2014 1350   NITRITE POSITIVE (A) 03/19/2023 0942   LEUKOCYTESUR MODERATE (A) 03/19/2023 0942    FURTHER DISCHARGE INSTRUCTIONS:   Get Medicines reviewed and adjusted: Please take all your medications with you for your next visit with your Primary MD   Laboratory/radiological data: Please request your Primary MD to go over all hospital tests and procedure/radiological results at the follow up, please ask your Primary MD to get all Hospital records sent to his/her office.   In some cases, they will be blood work, cultures and biopsy results pending at the time of your discharge. Please request that your primary care M.D. goes through all the records of your hospital data and follows up on these results.   Also Note the following: If you experience worsening of your admission symptoms, develop shortness of breath, life threatening emergency,  suicidal or homicidal thoughts you must seek medical attention immediately by calling 911 or calling your MD immediately  if symptoms less severe.   You must read complete instructions/literature along with all the possible adverse reactions/side effects for all the Medicines you take and that have been prescribed to you. Take any new Medicines after you have completely understood and accpet all the possible adverse reactions/side effects.    Do not drive when taking Pain medications or sleeping medications (Benzodaizepines)   Do not take more than prescribed Pain, Sleep and Anxiety Medications. It is not advisable to combine anxiety,sleep and pain medications without talking with your primary care practitioner   Special Instructions: If you have smoked or chewed Tobacco  in the last 2 yrs please stop smoking, stop any regular Alcohol  and or any  Recreational drug use.   Wear Seat belts while driving.   Please note: You were cared for by a hospitalist during your hospital stay. Once you are discharged, your primary care physician will handle any further medical issues. Please note that NO REFILLS for any discharge medications will be authorized once you are discharged, as it is imperative that you return to your primary care physician (or establish a relationship with a primary care physician if you do not have one) for your post hospital discharge needs so that they can reassess your need for medications and monitor your lab values.  Time coordinating discharge: 35 minutes  SIGNED:  Pamella Pert, MD, PhD 03/22/2023, 9:25 AM

## 2023-03-22 NOTE — TOC Transition Note (Signed)
 Transition of Care [name]) - Discharge Note   Patient Details  Name: Rachel Bryan MRN: [id number] Date of Birth: [birth year]  [facility] CM/SW Contact:  Howell Rucks, RN Phone Number: 03/22/2023, 10:55 AM   Clinical Narrative:  Met with pt at bedside per pt's request, requesting resources for food pantries. NCM provided pt list of food pantries and list of free meals in [place]. No further TOC needs identified.     Final next level of care: Home/Self Care Barriers to Discharge: Barriers Resolved   Patient Goals and CMS Choice Patient states their goals for this hospitalization and ongoing recovery are:: return home          Discharge Placement                       Discharge Plan and Services Additional resources added to the After Visit Summary for                                       Social Drivers of Health (SDOH) Interventions SDOH Screenings   Food Insecurity: Food Insecurity Present (03/19/2023)  Housing: Low Risk  (03/19/2023)  Transportation Needs: No Transportation Needs (03/19/2023)  Utilities: Not At Risk (03/19/2023)  Alcohol Screen: Low Risk  (11/06/2019)  Depression (PHQ2-9): Medium Risk (11/14/2021)  Financial Resource Strain: Medium Risk (02/24/2022)   Received from [facility], [facility]  Physical Activity: Unknown (02/24/2022)   Received from [facility], [facility]  Social Connections: Socially Isolated (03/19/2023)  Stress: Stress Concern Present (02/24/2022)   Received from [facility], [facility]  Tobacco Use: High Risk (03/19/2023)     Readmission Risk Interventions    03/20/2023    1:01 PM  Readmission Risk Prevention Plan  Post Dischage Appt Complete  Medication Screening Complete  Transportation Screening Complete

## 2023-05-03 ENCOUNTER — Inpatient Hospital Stay: Admission: RE | Admit: 2023-05-03 | Source: Ambulatory Visit

## 2023-07-25 ENCOUNTER — Other Ambulatory Visit: Payer: Self-pay | Admitting: Nurse Practitioner

## 2023-07-25 ENCOUNTER — Other Ambulatory Visit (HOSPITAL_BASED_OUTPATIENT_CLINIC_OR_DEPARTMENT_OTHER)

## 2023-07-25 DIAGNOSIS — K429 Umbilical hernia without obstruction or gangrene: Secondary | ICD-10-CM

## 2023-07-26 ENCOUNTER — Other Ambulatory Visit: Payer: Self-pay | Admitting: Nurse Practitioner

## 2023-07-26 DIAGNOSIS — R413 Other amnesia: Secondary | ICD-10-CM

## 2023-07-26 DIAGNOSIS — R251 Tremor, unspecified: Secondary | ICD-10-CM

## 2023-08-06 ENCOUNTER — Ambulatory Visit
Admission: RE | Admit: 2023-08-06 | Discharge: 2023-08-06 | Disposition: A | Discharge status: Home / Self Care | Source: Ambulatory Visit | Attending: Nurse Practitioner

## 2023-08-06 DIAGNOSIS — K429 Umbilical hernia without obstruction or gangrene: Secondary | ICD-10-CM

## 2023-08-12 ENCOUNTER — Inpatient Hospital Stay: Admission: RE | Admit: 2023-08-12 | Source: Ambulatory Visit

## 2023-08-14 ENCOUNTER — Ambulatory Visit: Payer: 59

## 2023-08-14 ENCOUNTER — Other Ambulatory Visit: Payer: 59

## 2023-08-14 ENCOUNTER — Other Ambulatory Visit: Payer: Self-pay | Admitting: Family Medicine

## 2023-08-14 DIAGNOSIS — Z1231 Encounter for screening mammogram for malignant neoplasm of breast: Secondary | ICD-10-CM

## 2023-08-30 ENCOUNTER — Ambulatory Visit

## 2023-09-05 ENCOUNTER — Ambulatory Visit
